# Patient Record
Sex: Female | Born: 1990 | Race: Black or African American | Hispanic: No | Marital: Single | Smoking: Current every day smoker
Health system: Southern US, Community
[De-identification: ages and names within clinical notes are randomized; demographics above are authoritative.]

## PROBLEM LIST (undated history)

## (undated) ENCOUNTER — Inpatient Hospital Stay (HOSPITAL_COMMUNITY): Payer: Self-pay

## (undated) DIAGNOSIS — F329 Major depressive disorder, single episode, unspecified: Secondary | ICD-10-CM

## (undated) DIAGNOSIS — A749 Chlamydial infection, unspecified: Secondary | ICD-10-CM

## (undated) DIAGNOSIS — N12 Tubulo-interstitial nephritis, not specified as acute or chronic: Secondary | ICD-10-CM

## (undated) DIAGNOSIS — L309 Dermatitis, unspecified: Secondary | ICD-10-CM

## (undated) DIAGNOSIS — A549 Gonococcal infection, unspecified: Secondary | ICD-10-CM

## (undated) DIAGNOSIS — D649 Anemia, unspecified: Secondary | ICD-10-CM

## (undated) DIAGNOSIS — F32A Depression, unspecified: Secondary | ICD-10-CM

## (undated) DIAGNOSIS — G459 Transient cerebral ischemic attack, unspecified: Secondary | ICD-10-CM

## (undated) HISTORY — PX: NO PAST SURGERIES: SHX2092

---
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
## 2008-02-08 ENCOUNTER — Emergency Department (HOSPITAL_COMMUNITY): Admission: EM | Admit: 2008-02-08 | Discharge: 2008-02-08 | Payer: Self-pay | Admitting: Emergency Medicine

## 2008-04-06 ENCOUNTER — Emergency Department (HOSPITAL_COMMUNITY): Admission: EM | Admit: 2008-04-06 | Discharge: 2008-04-07 | Payer: Self-pay | Admitting: Emergency Medicine

## 2008-04-07 ENCOUNTER — Emergency Department (HOSPITAL_COMMUNITY): Admission: EM | Admit: 2008-04-07 | Discharge: 2008-04-07 | Payer: Self-pay | Admitting: Emergency Medicine

## 2008-08-25 ENCOUNTER — Emergency Department (HOSPITAL_COMMUNITY): Admission: EM | Admit: 2008-08-25 | Discharge: 2008-08-25 | Payer: Self-pay | Admitting: Emergency Medicine

## 2009-03-01 ENCOUNTER — Inpatient Hospital Stay (HOSPITAL_COMMUNITY): Admission: AD | Admit: 2009-03-01 | Discharge: 2009-03-01 | Payer: Self-pay | Admitting: Obstetrics & Gynecology

## 2009-04-06 ENCOUNTER — Inpatient Hospital Stay (HOSPITAL_COMMUNITY): Admission: AD | Admit: 2009-04-06 | Discharge: 2009-04-06 | Payer: Self-pay | Admitting: Obstetrics & Gynecology

## 2009-04-07 ENCOUNTER — Inpatient Hospital Stay (HOSPITAL_COMMUNITY): Admission: AD | Admit: 2009-04-07 | Discharge: 2009-04-07 | Payer: Self-pay | Admitting: Obstetrics and Gynecology

## 2009-04-25 ENCOUNTER — Inpatient Hospital Stay (HOSPITAL_COMMUNITY): Admission: AD | Admit: 2009-04-25 | Discharge: 2009-04-25 | Payer: Self-pay | Admitting: Obstetrics & Gynecology

## 2009-04-25 ENCOUNTER — Ambulatory Visit: Payer: Self-pay | Admitting: Advanced Practice Midwife

## 2009-06-01 ENCOUNTER — Inpatient Hospital Stay (HOSPITAL_COMMUNITY): Admission: AD | Admit: 2009-06-01 | Discharge: 2009-06-01 | Payer: Self-pay | Admitting: Obstetrics & Gynecology

## 2009-06-25 ENCOUNTER — Ambulatory Visit (HOSPITAL_COMMUNITY): Admission: RE | Admit: 2009-06-25 | Discharge: 2009-06-25 | Payer: Self-pay | Admitting: Obstetrics

## 2009-09-28 ENCOUNTER — Inpatient Hospital Stay (HOSPITAL_COMMUNITY): Admission: AD | Admit: 2009-09-28 | Discharge: 2009-09-30 | Payer: Self-pay | Admitting: Obstetrics

## 2010-01-13 ENCOUNTER — Emergency Department (HOSPITAL_COMMUNITY): Admission: EM | Admit: 2010-01-13 | Discharge: 2010-01-14 | Payer: Self-pay | Admitting: Emergency Medicine

## 2010-03-28 ENCOUNTER — Inpatient Hospital Stay (HOSPITAL_COMMUNITY): Admission: AD | Admit: 2010-03-28 | Discharge: 2010-03-28 | Payer: Self-pay | Admitting: Obstetrics & Gynecology

## 2010-05-21 ENCOUNTER — Ambulatory Visit: Payer: Self-pay | Admitting: Nurse Practitioner

## 2010-05-21 ENCOUNTER — Inpatient Hospital Stay (HOSPITAL_COMMUNITY): Admission: AD | Admit: 2010-05-21 | Discharge: 2010-05-21 | Payer: Self-pay | Admitting: Obstetrics & Gynecology

## 2010-08-22 ENCOUNTER — Emergency Department (HOSPITAL_COMMUNITY): Admission: EM | Admit: 2010-08-22 | Discharge: 2010-08-22 | Payer: Self-pay | Admitting: Emergency Medicine

## 2011-01-18 LAB — POCT URINALYSIS DIPSTICK
Bilirubin Urine: NEGATIVE
Ketones, ur: NEGATIVE mg/dL
Protein, ur: NEGATIVE mg/dL
Specific Gravity, Urine: 1.02 (ref 1.005–1.030)

## 2011-01-18 LAB — GC/CHLAMYDIA PROBE AMP, GENITAL
Chlamydia, DNA Probe: POSITIVE — AB
GC Probe Amp, Genital: POSITIVE — AB

## 2011-01-18 LAB — POCT PREGNANCY, URINE: Preg Test, Ur: NEGATIVE

## 2011-01-18 LAB — WET PREP, GENITAL: Yeast Wet Prep HPF POC: NONE SEEN

## 2011-01-21 LAB — URINE MICROSCOPIC-ADD ON

## 2011-01-21 LAB — URINALYSIS, ROUTINE W REFLEX MICROSCOPIC
Bilirubin Urine: NEGATIVE
Glucose, UA: NEGATIVE mg/dL
Ketones, ur: 15 mg/dL — AB
Nitrite: NEGATIVE
Protein, ur: NEGATIVE mg/dL
Specific Gravity, Urine: >1.03 — ABNORMAL HIGH (ref 1.005–1.030)
Urobilinogen, UA: 1 mg/dL (ref 0.0–1.0)
pH: 6 (ref 5.0–8.0)

## 2011-01-21 LAB — POCT PREGNANCY, URINE: Preg Test, Ur: NEGATIVE

## 2011-01-21 LAB — WET PREP, GENITAL
Trich, Wet Prep: NONE SEEN
Yeast Wet Prep HPF POC: NONE SEEN

## 2011-01-21 LAB — GC/CHLAMYDIA PROBE AMP, GENITAL
Chlamydia, DNA Probe: POSITIVE — AB
GC Probe Amp, Genital: POSITIVE — AB

## 2011-01-23 LAB — DIFFERENTIAL
Basophils Absolute: 0 10*3/uL (ref 0.0–0.1)
Eosinophils Relative: 0 % (ref 0–5)
Lymphocytes Relative: 9 % — ABNORMAL LOW (ref 12–46)
Lymphs Abs: 1.3 10*3/uL (ref 0.7–4.0)
Monocytes Absolute: 1.7 10*3/uL — ABNORMAL HIGH (ref 0.1–1.0)
Monocytes Relative: 11 % (ref 3–12)
Neutro Abs: 12.8 10*3/uL — ABNORMAL HIGH (ref 1.7–7.7)

## 2011-01-23 LAB — COMPREHENSIVE METABOLIC PANEL
AST: 32 U/L (ref 0–37)
Albumin: 3.5 g/dL (ref 3.5–5.2)
BUN: 4 mg/dL — ABNORMAL LOW (ref 6–23)
Chloride: 98 mEq/L (ref 96–112)
Creatinine, Ser: 0.86 mg/dL (ref 0.4–1.2)
GFR calc Af Amer: >60 mL/min (ref >60)
Total Bilirubin: 1 mg/dL (ref 0.3–1.2)
Total Protein: 7.9 g/dL (ref 6.0–8.3)

## 2011-01-23 LAB — URINE MICROSCOPIC-ADD ON

## 2011-01-23 LAB — CBC
MCV: 84.9 fL (ref 78.0–100.0)
Platelets: 177 10*3/uL (ref 150–400)
RDW: 14.6 % (ref 11.5–15.5)
WBC: 15.9 10*3/uL — ABNORMAL HIGH (ref 4.0–10.5)

## 2011-01-23 LAB — WET PREP, GENITAL
Trich, Wet Prep: NONE SEEN
Yeast Wet Prep HPF POC: NONE SEEN

## 2011-01-23 LAB — URINE CULTURE

## 2011-01-23 LAB — URINALYSIS, ROUTINE W REFLEX MICROSCOPIC
Bilirubin Urine: NEGATIVE
Glucose, UA: NEGATIVE mg/dL
Ketones, ur: 40 mg/dL — AB
Specific Gravity, Urine: >1.03 — ABNORMAL HIGH (ref 1.005–1.030)
pH: 6 (ref 5.0–8.0)

## 2011-01-23 LAB — POCT PREGNANCY, URINE: Preg Test, Ur: NEGATIVE

## 2011-01-30 LAB — URINALYSIS, ROUTINE W REFLEX MICROSCOPIC
Bilirubin Urine: NEGATIVE
Glucose, UA: NEGATIVE mg/dL
Specific Gravity, Urine: 1.022 (ref 1.005–1.030)
Urobilinogen, UA: 1 mg/dL (ref 0.0–1.0)
pH: 7 (ref 5.0–8.0)

## 2011-01-30 LAB — URINE CULTURE: Colony Count: >=100000

## 2011-01-30 LAB — URINE MICROSCOPIC-ADD ON

## 2011-01-30 LAB — POCT PREGNANCY, URINE: Preg Test, Ur: NEGATIVE

## 2011-02-08 LAB — TYPE AND SCREEN: Antibody Screen: NEGATIVE

## 2011-02-08 LAB — CBC
HCT: 29.8 % — ABNORMAL LOW (ref 36.0–46.0)
Hemoglobin: 10.6 g/dL — ABNORMAL LOW (ref 12.0–15.0)
MCHC: 33.4 g/dL (ref 30.0–36.0)
MCV: 87.4 fL (ref 78.0–100.0)
Platelets: 268 10*3/uL (ref 150–400)
Platelets: 295 10*3/uL (ref 150–400)
RDW: 13.3 % (ref 11.5–15.5)
RDW: 13.5 % (ref 11.5–15.5)

## 2011-02-08 LAB — DIFFERENTIAL
Basophils Absolute: 0 10*3/uL (ref 0.0–0.1)
Basophils Relative: 0 % (ref 0–1)
Monocytes Absolute: 0.7 10*3/uL (ref 0.1–1.0)
Neutro Abs: 9.8 10*3/uL — ABNORMAL HIGH (ref 1.7–7.7)
Neutrophils Relative %: 76 % (ref 43–77)

## 2011-02-08 LAB — GLUCOSE, CAPILLARY

## 2011-02-08 LAB — STREP B DNA PROBE

## 2011-02-08 LAB — RPR: RPR Ser Ql: NONREACTIVE

## 2011-02-08 LAB — HEPATITIS B SURFACE ANTIGEN: Hepatitis B Surface Ag: NEGATIVE

## 2011-02-12 LAB — URINALYSIS, ROUTINE W REFLEX MICROSCOPIC
Nitrite: POSITIVE — AB
Specific Gravity, Urine: 1.02 (ref 1.005–1.030)
pH: 6 (ref 5.0–8.0)

## 2011-02-12 LAB — URINE MICROSCOPIC-ADD ON

## 2011-02-12 LAB — GC/CHLAMYDIA PROBE AMP, GENITAL: Chlamydia, DNA Probe: NEGATIVE

## 2011-02-13 LAB — URINALYSIS, ROUTINE W REFLEX MICROSCOPIC
Glucose, UA: NEGATIVE mg/dL
Protein, ur: NEGATIVE mg/dL
pH: 6.5 (ref 5.0–8.0)

## 2011-02-15 LAB — ABO/RH: ABO/RH(D): B POS

## 2011-02-15 LAB — HCG, QUANTITATIVE, PREGNANCY: hCG, Beta Chain, Quant, S: 70557 m[IU]/mL — ABNORMAL HIGH (ref <5)

## 2011-02-15 LAB — URINALYSIS, ROUTINE W REFLEX MICROSCOPIC
Ketones, ur: 15 mg/dL — AB
Nitrite: POSITIVE — AB
Specific Gravity, Urine: 1.02 (ref 1.005–1.030)
pH: 7.5 (ref 5.0–8.0)

## 2011-02-15 LAB — URINE MICROSCOPIC-ADD ON

## 2011-02-15 LAB — CBC
MCV: 87.3 fL (ref 78.0–100.0)
RBC: 4.33 MIL/uL (ref 3.87–5.11)
WBC: 10.4 10*3/uL (ref 4.0–10.5)

## 2011-02-15 LAB — WET PREP, GENITAL: Trich, Wet Prep: NONE SEEN

## 2011-02-15 LAB — URINE CULTURE

## 2011-03-13 ENCOUNTER — Inpatient Hospital Stay (HOSPITAL_COMMUNITY)
Admission: AD | Admit: 2011-03-13 | Discharge: 2011-03-13 | Disposition: A | Discharge status: Home / Self Care | Payer: Self-pay | Source: Ambulatory Visit | Attending: Obstetrics & Gynecology | Admitting: Obstetrics & Gynecology

## 2011-03-13 ENCOUNTER — Inpatient Hospital Stay (HOSPITAL_COMMUNITY): Payer: Self-pay

## 2011-03-13 DIAGNOSIS — R109 Unspecified abdominal pain: Secondary | ICD-10-CM | POA: Insufficient documentation

## 2011-03-13 DIAGNOSIS — N760 Acute vaginitis: Secondary | ICD-10-CM

## 2011-03-13 DIAGNOSIS — N12 Tubulo-interstitial nephritis, not specified as acute or chronic: Secondary | ICD-10-CM | POA: Insufficient documentation

## 2011-03-13 LAB — CBC
Hemoglobin: 12.2 g/dL (ref 12.0–15.0)
MCH: 28.4 pg (ref 26.0–34.0)
MCV: 86.3 fL (ref 78.0–100.0)
RBC: 4.3 MIL/uL (ref 3.87–5.11)

## 2011-03-13 LAB — URINALYSIS, ROUTINE W REFLEX MICROSCOPIC
Nitrite: POSITIVE — AB
Specific Gravity, Urine: 1.02 (ref 1.005–1.030)
pH: 6.5 (ref 5.0–8.0)

## 2011-03-13 LAB — URINE MICROSCOPIC-ADD ON

## 2011-03-13 LAB — POCT PREGNANCY, URINE: Preg Test, Ur: NEGATIVE

## 2011-03-15 LAB — URINE CULTURE
Colony Count: >=100000
Culture  Setup Time: 201205080131

## 2011-03-15 LAB — GC/CHLAMYDIA PROBE AMP, GENITAL: GC Probe Amp, Genital: POSITIVE — AB

## 2011-07-16 ENCOUNTER — Encounter (HOSPITAL_COMMUNITY): Payer: Self-pay | Admitting: *Deleted

## 2011-07-16 ENCOUNTER — Inpatient Hospital Stay (HOSPITAL_COMMUNITY)
Admission: AD | Admit: 2011-07-16 | Discharge: 2011-07-16 | Disposition: A | Discharge status: Home / Self Care | Payer: Self-pay | Source: Ambulatory Visit | Attending: Obstetrics & Gynecology | Admitting: Obstetrics & Gynecology

## 2011-07-16 DIAGNOSIS — Z3490 Encounter for supervision of normal pregnancy, unspecified, unspecified trimester: Secondary | ICD-10-CM

## 2011-07-16 DIAGNOSIS — R109 Unspecified abdominal pain: Secondary | ICD-10-CM | POA: Insufficient documentation

## 2011-07-16 DIAGNOSIS — B9689 Other specified bacterial agents as the cause of diseases classified elsewhere: Secondary | ICD-10-CM | POA: Insufficient documentation

## 2011-07-16 DIAGNOSIS — A499 Bacterial infection, unspecified: Secondary | ICD-10-CM | POA: Insufficient documentation

## 2011-07-16 DIAGNOSIS — Z1389 Encounter for screening for other disorder: Secondary | ICD-10-CM

## 2011-07-16 DIAGNOSIS — N760 Acute vaginitis: Secondary | ICD-10-CM | POA: Insufficient documentation

## 2011-07-16 DIAGNOSIS — O2390 Unspecified genitourinary tract infection in pregnancy, unspecified trimester: Secondary | ICD-10-CM | POA: Insufficient documentation

## 2011-07-16 HISTORY — DX: Gonococcal infection, unspecified: A54.9

## 2011-07-16 HISTORY — DX: Chlamydial infection, unspecified: A74.9

## 2011-07-16 HISTORY — DX: Depression, unspecified: F32.A

## 2011-07-16 HISTORY — DX: Major depressive disorder, single episode, unspecified: F32.9

## 2011-07-16 LAB — URINE MICROSCOPIC-ADD ON

## 2011-07-16 LAB — URINALYSIS, ROUTINE W REFLEX MICROSCOPIC
Bilirubin Urine: NEGATIVE
Glucose, UA: NEGATIVE mg/dL
Hgb urine dipstick: NEGATIVE
Ketones, ur: 15 mg/dL — AB
Nitrite: NEGATIVE
Protein, ur: NEGATIVE mg/dL
Specific Gravity, Urine: 1.025 (ref 1.005–1.030)
Urobilinogen, UA: 0.2 mg/dL (ref 0.0–1.0)
pH: 6 (ref 5.0–8.0)

## 2011-07-16 LAB — WET PREP, GENITAL
Trich, Wet Prep: NONE SEEN
Yeast Wet Prep HPF POC: NONE SEEN

## 2011-07-16 LAB — POCT PREGNANCY, URINE: Preg Test, Ur: POSITIVE

## 2011-07-16 MED ORDER — METRONIDAZOLE 500 MG PO TABS: 500.0000 mg | ORAL_TABLET | Freq: Two times a day (BID) | ORAL | Status: AC

## 2011-07-16 NOTE — Progress Notes (Signed)
Pt c/osharp constant  lower abdominal x3 days. No vaginal bleeding or discharge.

## 2011-07-16 NOTE — ED Provider Notes (Signed)
Chief Complaint:  Abdominal Pain   Theresa [name] is  20 y.o. G2P1001.  Patient's last menstrual period was 05/31/2011.Marland Kitchen [redacted]w[redacted]d Her pregnancy status is positive.  She presents complaining of Abdominal Pain . Onset is described as insidious and has been present for  3 days. Described as lower abd cramping, mild. Denies vag discharge, bleeding, nausea, vomiting dysuria, fever, or chills   Obstetrical/Gynecological History: OB History    Grav Para Term Preterm Abortions TAB SAB Ect Mult Living   2 1 1       1       Past Medical History: Past Medical History  Diagnosis Date  . Chlamydia   . Gonorrhea   . Depression     Past Surgical History: Past Surgical History  Procedure Date  . No past surgeries     Family History: No family history on file.  Social History: History  Substance Use Topics  . Smoking status: Never Smoker   . Smokeless tobacco: Not on file  . Alcohol Use: No    Allergies: No Known Allergies  No prescriptions prior to admission    Review of Systems - Negative except what has been reviewed in the HPI  Physical Exam   Blood pressure 108/62, pulse 75, temperature 98.1 F (36.7 C), temperature source Oral, resp. rate 20, height 5' (1.524 m), weight 55.339 kg (122 lb), last menstrual period 05/31/2011.  General: General appearance - alert, well appearing, and in no distress and normal appearing weight Mental status - alert, oriented to person, place, and time, normal mood, behavior, speech, dress, motor activity, and thought processes Abdomen - soft, nontender, nondistended, no masses or organomegaly no CVA tenderness Focused Gynecological Exam: VULVA: normal appearing vulva with no masses, tenderness or lesions, VAGINA: vaginal discharge - white, milky and thin, CERVIX: normal appearing cervix without discharge or lesions, UTERUS: uterus is normal size, shape, consistency and nontender, surgically absent, vaginal cuff well healed, enlarged to 8-9 week's  size, ADNEXA: normal adnexa in size, nontender and no masses  Labs: Recent Results (from the past 24 hour(s))  URINALYSIS, ROUTINE W REFLEX MICROSCOPIC   Collection Time   07/16/11  9:35 PM      Component Value Range   Color, Urine YELLOW  YELLOW    Appearance CLEAR  CLEAR    Specific Gravity, Urine 1.025  1.005 - 1.030    pH 6.0  5.0 - 8.0    Glucose, UA NEGATIVE  NEGATIVE (mg/dL)   Hgb urine dipstick NEGATIVE  NEGATIVE    Bilirubin Urine NEGATIVE  NEGATIVE    Ketones, ur 15 (*) NEGATIVE (mg/dL)   Protein, ur NEGATIVE  NEGATIVE (mg/dL)   Urobilinogen, UA 0.2  0.0 - 1.0 (mg/dL)   Nitrite NEGATIVE  NEGATIVE    Leukocytes, UA TRACE (*) NEGATIVE   URINE MICROSCOPIC-ADD ON   Collection Time   07/16/11  9:35 PM      Component Value Range   Squamous Epithelial / LPF RARE  RARE    WBC, UA 3-6  <3 (WBC/hpf)   RBC / HPF 0-2  <3 (RBC/hpf)   Bacteria, UA MANY (*) RARE   POCT PREGNANCY, URINE   Collection Time   07/16/11  9:41 PM      Component Value Range   Preg Test, Ur POSITIVE    WET PREP, GENITAL   Collection Time   07/16/11 10:37 PM      Component Value Range   Yeast, Wet Prep NONE SEEN  NONE SEEN  Trich, Wet Prep NONE SEEN  NONE SEEN    Clue Cells, Wet Prep MODERATE (*) NONE SEEN    WBC, Wet Prep HPF POC MODERATE (*) NONE SEEN    Imaging Studies:  Informal bedside US: IUP, + yolk sac, CRL [redacted]w[redacted]d, + cardiac activity   Assessment: Viable IUP  Abd Pain BV  Plan: Discharge home Rx Flagyl FU with the OB/Gyn provider of choice   Joseeduardo Brix E. 07/16/2011,11:21 PM

## 2011-07-17 NOTE — ED Provider Notes (Signed)
Agree with above note.  Theresa [name] H. 07/17/2011 5:58 AM  

## 2011-08-01 LAB — URINE MICROSCOPIC-ADD ON

## 2011-08-01 LAB — URINALYSIS, ROUTINE W REFLEX MICROSCOPIC
Hgb urine dipstick: NEGATIVE
Nitrite: POSITIVE — AB
Protein, ur: NEGATIVE
Specific Gravity, Urine: 1.028
Urobilinogen, UA: 2 — ABNORMAL HIGH

## 2011-08-01 LAB — POCT PREGNANCY, URINE
Operator id: 29452
Preg Test, Ur: NEGATIVE

## 2011-08-01 LAB — RPR: RPR Ser Ql: NONREACTIVE

## 2011-08-07 LAB — URINALYSIS, ROUTINE W REFLEX MICROSCOPIC
Bilirubin Urine: NEGATIVE
Hgb urine dipstick: NEGATIVE
Specific Gravity, Urine: 1.027
Urobilinogen, UA: 1
pH: 7

## 2011-08-07 LAB — URINE CULTURE: Colony Count: >=100000

## 2011-08-07 LAB — URINE MICROSCOPIC-ADD ON

## 2011-08-22 LAB — POCT RAPID STREP A: Streptococcus, Group A Screen (Direct): NEGATIVE

## 2011-08-22 LAB — DIFFERENTIAL
Basophils Relative: 0
Lymphocytes Relative: 31
Lymphs Abs: 2
Monocytes Absolute: 0.7
Monocytes Relative: 10
Neutro Abs: 3.7
Neutrophils Relative %: 58

## 2011-08-22 LAB — CBC
Hemoglobin: 13.2
RBC: 4.67
WBC: 6.4

## 2011-08-22 LAB — POCT INFECTIOUS MONO SCREEN: Mono Screen: NEGATIVE

## 2011-09-12 ENCOUNTER — Encounter (HOSPITAL_COMMUNITY): Payer: Self-pay | Admitting: *Deleted

## 2011-09-12 ENCOUNTER — Inpatient Hospital Stay (HOSPITAL_COMMUNITY)
Admission: AD | Admit: 2011-09-12 | Discharge: 2011-09-13 | Disposition: A | Discharge status: Home / Self Care | Payer: Self-pay | Source: Ambulatory Visit | Attending: Obstetrics & Gynecology | Admitting: Obstetrics & Gynecology

## 2011-09-12 DIAGNOSIS — O99891 Other specified diseases and conditions complicating pregnancy: Secondary | ICD-10-CM | POA: Insufficient documentation

## 2011-09-12 DIAGNOSIS — N949 Unspecified condition associated with female genital organs and menstrual cycle: Secondary | ICD-10-CM

## 2011-09-12 DIAGNOSIS — R109 Unspecified abdominal pain: Secondary | ICD-10-CM | POA: Insufficient documentation

## 2011-09-12 LAB — URINALYSIS, ROUTINE W REFLEX MICROSCOPIC
Bilirubin Urine: NEGATIVE
Glucose, UA: NEGATIVE mg/dL
Hgb urine dipstick: NEGATIVE
Specific Gravity, Urine: >1.03 — ABNORMAL HIGH (ref 1.005–1.030)
Urobilinogen, UA: 0.2 mg/dL (ref 0.0–1.0)
pH: 6 (ref 5.0–8.0)

## 2011-09-12 NOTE — Progress Notes (Signed)
Pt states she is pregnant and is having a headache for the past 2wks. Pt states she is also having cyst.

## 2011-09-12 NOTE — Progress Notes (Signed)
Pt states, " I've had low abdominal pain for three days. It comes on and last for three hours or so and then goes away.I think my pee had a little odor. I 'm having a headache everyday for two week, and it won't go away."

## 2011-09-13 LAB — WET PREP, GENITAL
Clue Cells Wet Prep HPF POC: NONE SEEN
Yeast Wet Prep HPF POC: NONE SEEN

## 2011-09-13 MED ORDER — ACETAMINOPHEN 325 MG PO TABS
650.0000 mg | ORAL_TABLET | Freq: Once | ORAL | Status: AC
  Administered 2011-09-13: 650 mg via ORAL
  Filled 2011-09-13: qty 2

## 2011-09-13 NOTE — ED Provider Notes (Signed)
History     CSN: [id number] Arrival date & time: 09/12/2011 10:23 PM   None     Chief Complaint  Patient presents with  . Abdominal Pain  . Headache    (Consider location/radiation/quality/duration/timing/severity/associated sxs/prior treatment) HPI Theresa [name] is a 20 y.o. female @ [redacted]w[redacted]d gestation presents to MAU for lower abdominal pain that started 2 days ago. Describes the pain as sharp that comes and goes. Denies vaginal bleeding or  UTI symptoms.  Complains of headache that has been persistent for the past 2 weeks. Took motrin but did not work. Has not started prenatal care.   Past Medical History  Diagnosis Date  . Chlamydia   . Gonorrhea   . Depression     Past Surgical History  Procedure Date  . No past surgeries     History reviewed. No pertinent family history.  History  Substance Use Topics  . Smoking status: Current Some Day Smoker -- 0.0 packs/day  . Smokeless tobacco: Not on file  . Alcohol Use: No    OB History    Grav Para Term Preterm Abortions TAB SAB Ect Mult Living   2 1 1       1       Review of Systems  Constitutional: Positive for appetite change. Negative for fever, chills, diaphoresis and fatigue.  HENT: Negative for ear pain, congestion, sore throat, facial swelling, neck pain, neck stiffness, dental problem and sinus pressure.   Eyes: Negative for photophobia, pain and discharge.  Respiratory: Negative for cough, chest tightness and wheezing.   Gastrointestinal: Negative for nausea, vomiting, abdominal pain, diarrhea, constipation and abdominal distention.  Genitourinary: Positive for vaginal discharge. Negative for dysuria, frequency, flank pain, vaginal bleeding and difficulty urinating.  Musculoskeletal: Positive for back pain. Negative for myalgias and gait problem.  Skin: Negative for color change and rash.  Neurological: Positive for headaches. Negative for dizziness, speech difficulty, weakness, light-headedness and numbness.    Psychiatric/Behavioral: Negative for confusion and agitation.    Allergies  Review of patient's allergies indicates no known allergies.  Home Medications  No current outpatient prescriptions on file.  BP 111/51  Pulse 88  Temp(Src) 98.7 F (37.1 C) (Oral)  Resp 16  Ht 5' 0.5" (1.537 m)  Wt 121 lb 8 oz (55.112 kg)  BMI 23.34 kg/m2  SpO2 99%  LMP 05/31/2011  Physical Exam  Nursing note and vitals reviewed. Constitutional: She is oriented to person, place, and time. She appears well-developed and well-nourished. No distress.  HENT:  Head: Normocephalic.  Eyes: EOM are normal.  Neck: Neck supple.  Cardiovascular: Normal rate.   Pulmonary/Chest: Effort normal.  Abdominal: Soft.       Minimal tenderness bilateral abdomen without rebound or guarding. Consistent with round ligament pain. Positive doppler fetal heart tones at 152.  Genitourinary:       External genitalia without lesions. White vaginal discharge. Cervix closed, long, uterus consistent with gestational age.  Musculoskeletal: Normal range of motion.  Neurological: She is alert and oriented to person, place, and time. No cranial nerve deficit.  Skin: Skin is warm and dry.  Psychiatric: She has a normal mood and affect. Her behavior is normal. Judgment and thought content normal.   After tylenol patient's headache has gone and she has no abdominal pain. Informal bedside ultrasound shows active IUP.  Assessment:  Round ligament pain  Plan:  Tylenol as needed for pain   Stop ibuprofen   Follow up with Dr. Gaynell Face for prenatal care as  planned.  ED Course  Procedures   MDM          Kerrie Buffalo, NP 09/13/11 [id number]

## 2011-09-14 LAB — GC/CHLAMYDIA PROBE AMP, GENITAL
Chlamydia, DNA Probe: NEGATIVE
GC Probe Amp, Genital: NEGATIVE

## 2011-10-05 ENCOUNTER — Inpatient Hospital Stay (HOSPITAL_COMMUNITY)
Admission: AD | Admit: 2011-10-05 | Discharge: 2011-10-05 | Disposition: A | Discharge status: Home / Self Care | Source: Ambulatory Visit | Attending: Obstetrics & Gynecology | Admitting: Obstetrics & Gynecology

## 2011-10-05 ENCOUNTER — Encounter (HOSPITAL_COMMUNITY): Payer: Self-pay | Admitting: *Deleted

## 2011-10-05 ENCOUNTER — Inpatient Hospital Stay (HOSPITAL_COMMUNITY)

## 2011-10-05 DIAGNOSIS — O0930 Supervision of pregnancy with insufficient antenatal care, unspecified trimester: Secondary | ICD-10-CM | POA: Insufficient documentation

## 2011-10-05 DIAGNOSIS — Z3490 Encounter for supervision of normal pregnancy, unspecified, unspecified trimester: Secondary | ICD-10-CM

## 2011-10-05 DIAGNOSIS — Z3480 Encounter for supervision of other normal pregnancy, unspecified trimester: Secondary | ICD-10-CM

## 2011-10-05 DIAGNOSIS — O368190 Decreased fetal movements, unspecified trimester, not applicable or unspecified: Secondary | ICD-10-CM | POA: Insufficient documentation

## 2011-10-05 NOTE — Progress Notes (Signed)
Patient states she has been feeling the baby move but has not felt it in about 5 days. Denies any pain or bleeding at this time. Patient in incarcerated.

## 2011-10-05 NOTE — Progress Notes (Signed)
Pt in c/o decreased fetal movement.  States she has been feeling movement for a few weeks, and hadn't felt movement in 5 days.  Denies any bleeding or discharge.  Denies any pain.

## 2011-10-05 NOTE — ED Provider Notes (Signed)
History     Chief Complaint  Patient presents with  . Decreased Fetal Movement   HPI  Pt here with report of concern due to decreased fetal movement x 5 days.  Denies vaginal bleeding, abnormal discharge, or leaking of fluid.  Currently in jail with expected release 10/13/11.    Past Medical History  Diagnosis Date  . Chlamydia   . Gonorrhea   . Depression     Past Surgical History  Procedure Date  . No past surgeries     Family History  Problem Relation Age of Onset  . Birth defects Neg Hx   . Diabetes Neg Hx   . Stroke Neg Hx   . Hypertension Neg Hx     History  Substance Use Topics  . Smoking status: Current Some Day Smoker -- 0.0 packs/day  . Smokeless tobacco: Not on file  . Alcohol Use: No    Allergies: No Known Allergies  Prescriptions prior to admission  Medication Sig Dispense Refill  . prenatal vitamin w/FE, FA (PRENATAL 1 + 1) 27-1 MG TABS Take 1 tablet by mouth daily.          Review of Systems  Constitutional:       Decreased fetal movement  All other systems reviewed and are negative.   Physical Exam   Blood pressure 110/56, pulse 75, temperature 98.7 F (37.1 C), temperature source Oral, resp. rate 16, last menstrual period 05/31/2011, SpO2 99.00%.  Physical Exam  Constitutional: She is oriented to person, place, and time. She appears well-developed and well-nourished. No distress.  HENT:  Head: Normocephalic.  Neck: Normal range of motion. Neck supple.  Cardiovascular: Normal rate, regular rhythm and normal heart sounds.   Respiratory: Effort normal and breath sounds normal.  GI: Soft. There is no tenderness.  Genitourinary: No bleeding around the vagina.  Neurological: She is alert and oriented to person, place, and time.  Skin: Skin is warm and dry.    MAU Course  Procedures  Dating Ultrasound:   EGA 20.1 weeks; cervical length 3.31 cm     Assessment and Plan  Pregnancy - No prenatal Care  Plan: DC to jail Provided  reassurance F/U with provider once released on 10/13/11 [facility]   [facility] 10/05/2011, 5:20 PM

## 2011-11-03 ENCOUNTER — Encounter (HOSPITAL_COMMUNITY): Payer: Self-pay | Admitting: *Deleted

## 2011-11-03 ENCOUNTER — Inpatient Hospital Stay (HOSPITAL_COMMUNITY)
Admission: AD | Admit: 2011-11-03 | Discharge: 2011-11-03 | Disposition: A | Discharge status: Home / Self Care | Source: Ambulatory Visit | Attending: Obstetrics & Gynecology | Admitting: Obstetrics & Gynecology

## 2011-11-03 DIAGNOSIS — O4690 Antepartum hemorrhage, unspecified, unspecified trimester: Secondary | ICD-10-CM | POA: Insufficient documentation

## 2011-11-03 DIAGNOSIS — N898 Other specified noninflammatory disorders of vagina: Secondary | ICD-10-CM

## 2011-11-03 DIAGNOSIS — O0930 Supervision of pregnancy with insufficient antenatal care, unspecified trimester: Secondary | ICD-10-CM

## 2011-11-03 LAB — DIFFERENTIAL
Basophils Relative: 0 % (ref 0–1)
Eosinophils Relative: 2 % (ref 0–5)
Lymphocytes Relative: 27 % (ref 12–46)
Monocytes Absolute: 0.5 10*3/uL (ref 0.1–1.0)
Monocytes Relative: 5 % (ref 3–12)
Neutro Abs: 7.3 10*3/uL (ref 1.7–7.7)

## 2011-11-03 LAB — TYPE AND SCREEN

## 2011-11-03 LAB — HIV ANTIBODY (ROUTINE TESTING W REFLEX): HIV: NONREACTIVE

## 2011-11-03 LAB — CBC
HCT: 30.8 % — ABNORMAL LOW (ref 36.0–46.0)
Hemoglobin: 10.6 g/dL — ABNORMAL LOW (ref 12.0–15.0)
MCHC: 34.4 g/dL (ref 30.0–36.0)
MCV: 86.8 fL (ref 78.0–100.0)

## 2011-11-03 NOTE — Progress Notes (Signed)
Dr. Stinson in to see pt. 

## 2011-11-03 NOTE — Progress Notes (Signed)
Written and verbal d/c instructions given and understanding voiced. Plans to see Dr Gaynell Face and waiting for [facility] card

## 2011-11-03 NOTE — ED Notes (Signed)
Dr Adrian Blackwater notified of pt's admission and status. Will see pt

## 2011-11-03 NOTE — ED Provider Notes (Signed)
History     Chief Complaint  Patient presents with  . Vaginal Bleeding   HPI This is a 20 year old female G2 P1 001 at 24 weeks and 2 days who presents the MAU with a reddish brown discharge and concerns for vaginal bleeding. Patient denies vaginal discharge, itching, contractions, and decreased fetal activity. She noticed the discharge starting this morning, which has been gradually gone away. She reports no other difficulties with this pregnancy. She has not sought prenatal care yet  OB History    Grav Para Term Preterm Abortions TAB SAB Ect Mult Living   2 1 1       1       Past Medical History  Diagnosis Date  . Chlamydia   . Gonorrhea   . Depression     Past Surgical History  Procedure Date  . No past surgeries     Family History  Problem Relation Age of Onset  . Birth defects Neg Hx   . Diabetes Neg Hx   . Stroke Neg Hx   . Hypertension Neg Hx   . Anesthesia problems Neg Hx   . Hypotension Neg Hx   . Malignant hyperthermia Neg Hx   . Pseudochol deficiency Neg Hx     History  Substance Use Topics  . Smoking status: Former Smoker -- 0.0 packs/day  . Smokeless tobacco: Not on file  . Alcohol Use: No    Allergies: No Known Allergies  Prescriptions prior to admission  Medication Sig Dispense Refill  . acetaminophen (TYLENOL) 325 MG tablet Take 650 mg by mouth daily as needed. For pain       . prenatal vitamin w/FE, FA (PRENATAL 1 + 1) 27-1 MG TABS Take 1 tablet by mouth daily.          Review of Systems  All other systems reviewed and are negative.   Physical Exam   Blood pressure 101/76, pulse 87, temperature 99.2 F (37.3 C), temperature source Oral, resp. rate 20, height 5' 1.5" (1.562 m), weight 61.054 kg (134 lb 9.6 oz), last menstrual period 05/31/2011, SpO2 99.00%.  Physical Exam  Constitutional: She appears well-developed and well-nourished.  HENT:  Head: Normocephalic.  Eyes: Pupils are equal, round, and reactive to light.  Respiratory:  Effort normal.  GI: Soft. Bowel sounds are normal. She exhibits no distension and no mass. There is no tenderness. There is no rebound and no guarding.   Dilation: Closed Effacement (%): Thick Exam by:: Dr Adrian Blackwater No blood or discharge on glove  Fetal heart tones: Approximately 150 MAU Course  Procedures   Assessment and Plan  #1 G2 P1 001 at 24 weeks 2 days. #2 insufficient prenatal care  Patient was observed in the MAU for approximately one hour without contractions noted on tocometry. With no evidence of brownish discharge on exam, we'll discharge patient to home to followup with prenatal care provider, which the patient has indicated will be Dr. Gaynell Face. I did obtain prenatal labs. Preterm labor precautions given.  Theresa [name] 11/03/2011, 7:44 PM

## 2011-11-03 NOTE — Progress Notes (Signed)
Patient states she has had some brown spotting today. Denies any pain. Patient plans to see Dr. Gaynell Face but has not had an appointment yet. Reports fetal movement. Marland Kitchen

## 2011-11-03 NOTE — Progress Notes (Signed)
OK to d/c EFM per Dr Adrian Blackwater.

## 2011-11-03 NOTE — ED Notes (Signed)
Ginger ale to pt 

## 2011-11-04 LAB — RPR: RPR Ser Ql: NONREACTIVE

## 2011-11-07 NOTE — L&D Delivery Note (Signed)
Delivery Note At 10:16 PM a viable female was delivered via Vaginal, Spontaneous Delivery (Presentation: Right Occiput Anterior).  APGAR: 7, 9; weight .   Placenta status: Intact, Spontaneous.  Cord: 3 vessels with the following complications: None.  Cord pH: NA  Anesthesia: Epidural  Episiotomy: None Lacerations: None Est. Blood Loss (mL): 200  Mom to postpartum.  Baby to nursery-stable.  Tera Pellicane 02/20/2012, 10:41 PM

## 2011-11-07 NOTE — L&D Delivery Note (Signed)
Agree with above note.  Theresa [name] 03/11/2012 2:11 PM   

## 2012-01-15 ENCOUNTER — Encounter (HOSPITAL_COMMUNITY): Payer: Self-pay | Admitting: *Deleted

## 2012-01-15 ENCOUNTER — Inpatient Hospital Stay (HOSPITAL_COMMUNITY)
Admission: AD | Admit: 2012-01-15 | Discharge: 2012-01-15 | Disposition: A | Discharge status: Home Health | Payer: Self-pay | Attending: Obstetrics and Gynecology | Admitting: Obstetrics and Gynecology

## 2012-01-15 DIAGNOSIS — Z3480 Encounter for supervision of other normal pregnancy, unspecified trimester: Secondary | ICD-10-CM

## 2012-01-15 DIAGNOSIS — O0930 Supervision of pregnancy with insufficient antenatal care, unspecified trimester: Secondary | ICD-10-CM

## 2012-01-15 DIAGNOSIS — B9689 Other specified bacterial agents as the cause of diseases classified elsewhere: Secondary | ICD-10-CM | POA: Insufficient documentation

## 2012-01-15 DIAGNOSIS — O2390 Unspecified genitourinary tract infection in pregnancy, unspecified trimester: Secondary | ICD-10-CM | POA: Insufficient documentation

## 2012-01-15 DIAGNOSIS — O99891 Other specified diseases and conditions complicating pregnancy: Secondary | ICD-10-CM | POA: Insufficient documentation

## 2012-01-15 DIAGNOSIS — A499 Bacterial infection, unspecified: Secondary | ICD-10-CM

## 2012-01-15 DIAGNOSIS — N760 Acute vaginitis: Secondary | ICD-10-CM | POA: Insufficient documentation

## 2012-01-15 LAB — WET PREP, GENITAL
Trich, Wet Prep: NONE SEEN
Yeast Wet Prep HPF POC: NONE SEEN

## 2012-01-15 MED ORDER — METRONIDAZOLE 500 MG PO TABS: 500.0000 mg | ORAL_TABLET | Freq: Two times a day (BID) | ORAL | Status: AC

## 2012-01-15 NOTE — Progress Notes (Signed)
Pt G2 P1 at 34.5wks, had a gush of clear fluid at 0045.

## 2012-01-15 NOTE — ED Provider Notes (Signed)
History     Chief Complaint  Patient presents with  . Rupture of Membranes   HPI  Pt G2 P1 at 34.5wks, had a gush of clear fluid at 0045.  Denies vaginal bleeding.  Feels irregular "pains".  +fetal movement.     Past Medical History  Diagnosis Date  . Chlamydia   . Gonorrhea   . Depression     Past Surgical History  Procedure Date  . No past surgeries     Family History  Problem Relation Age of Onset  . Birth defects Neg Hx   . Diabetes Neg Hx   . Stroke Neg Hx   . Hypertension Neg Hx   . Anesthesia problems Neg Hx   . Hypotension Neg Hx   . Malignant hyperthermia Neg Hx   . Pseudochol deficiency Neg Hx     History  Substance Use Topics  . Smoking status: Former Smoker -- 0.0 packs/day    Types: Cigarettes  . Smokeless tobacco: Not on file  . Alcohol Use: No    Allergies: No Known Allergies  Prescriptions prior to admission  Medication Sig Dispense Refill  . acetaminophen (TYLENOL) 325 MG tablet Take 650 mg by mouth daily as needed. For pain         Review of Systems  Genitourinary:       Vaginal dischage  All other systems reviewed and are negative.   Physical Exam   Blood pressure 97/58, pulse 116, temperature 98.5 F (36.9 C), temperature source Oral, resp. rate 18, height 5' (1.524 m), weight 66.769 kg (147 lb 3.2 oz), last menstrual period 05/31/2011.  Physical Exam  Constitutional: She is oriented to person, place, and time. She appears well-developed and well-nourished. No distress.  HENT:  Head: Normocephalic.  Neck: Normal range of motion. Neck supple.  Cardiovascular: Normal rate, regular rhythm and normal heart sounds.  Exam reveals no gallop and no friction rub.   No murmur heard. Respiratory: Effort normal and breath sounds normal. No respiratory distress.  GI: Soft. There is no tenderness. There is no CVA tenderness.  Genitourinary: Uterus is enlarged. Cervix exhibits no motion tenderness and no discharge. Vaginal discharge (white,  creamy) found.       Cervix - closed  Musculoskeletal: Normal range of motion.  Neurological: She is alert and oriented to person, place, and time.  Skin: Skin is warm and dry.  Psychiatric: She has a normal mood and affect.   FHR 130's, +accel, reactive Toco - irritability (not felt by pt) MAU Course  Procedures  Results for orders placed during the hospital encounter of 01/15/12 (from the past 24 hour(s))  WET PREP, GENITAL     Status: Abnormal   Collection Time   01/15/12  2:10 AM      Component Value Range   Yeast Wet Prep HPF POC NONE SEEN  NONE SEEN    Trich, Wet Prep NONE SEEN  NONE SEEN    Clue Cells Wet Prep HPF POC FEW (*) NONE SEEN    WBC, Wet Prep HPF POC FEW (*) NONE SEEN      Assessment and Plan  Bacterial Vaginosis  Plan:  DC to home RX Flagyl Preterm Labor Precautions Schedule appointment in Low Risk clinic  [facility] 01/15/2012, 2:01 AM

## 2012-01-15 NOTE — Progress Notes (Signed)
Pt was lying on the couch and said she suddenly got wet and she wet the couch where she was laying.

## 2012-01-15 NOTE — Discharge Instructions (Signed)
Bacterial Vaginosis Bacterial vaginosis (BV) is a vaginal infection where the normal balance of bacteria in the vagina is disrupted. The normal balance is then replaced by an overgrowth of certain bacteria. There are several different kinds of bacteria that can cause BV. BV is the most common vaginal infection in women of childbearing age. CAUSES   The cause of BV is not fully understood. BV develops when there is an increase or imbalance of harmful bacteria.   Some activities or behaviors can upset the normal balance of bacteria in the vagina and put women at increased risk including:   Having a new sex partner or multiple sex partners.   Douching.   Using an intrauterine device (IUD) for contraception.   It is not clear what role sexual activity plays in the development of BV. However, women that have never had sexual intercourse are rarely infected with BV.  Women do not get BV from toilet seats, bedding, swimming pools or from touching objects around them.  SYMPTOMS   Grey vaginal discharge.   A fish-like odor with discharge, especially after sexual intercourse.   Itching or burning of the vagina and vulva.   Burning or pain with urination.   Some women have no signs or symptoms at all.  DIAGNOSIS  Your caregiver must examine the vagina for signs of BV. Your caregiver will perform lab tests and look at the sample of vaginal fluid through a microscope. They will look for bacteria and abnormal cells (clue cells), a pH test higher than 4.5, and a positive amine test all associated with BV.  RISKS AND COMPLICATIONS   Pelvic inflammatory disease (PID).   Infections following gynecology surgery.   Developing HIV.   Developing herpes virus.  TREATMENT  Sometimes BV will clear up without treatment. However, all women with symptoms of BV should be treated to avoid complications, especially if gynecology surgery is planned. Female partners generally do not need to be treated. However,  BV may spread between female sex partners so treatment is helpful in preventing a recurrence of BV.   BV may be treated with antibiotics. The antibiotics come in either pill or vaginal cream forms. Either can be used with nonpregnant or pregnant women, but the recommended dosages differ. These antibiotics are not harmful to the baby.   BV can recur after treatment. If this happens, a second round of antibiotics will often be prescribed.   Treatment is important for pregnant women. If not treated, BV can cause a premature delivery, especially for a pregnant woman who had a premature birth in the past. All pregnant women who have symptoms of BV should be checked and treated.   For chronic reoccurrence of BV, treatment with a type of prescribed gel vaginally twice a week is helpful.  HOME CARE INSTRUCTIONS   Finish all medication as directed by your caregiver.   Do not have sex until treatment is completed.   Tell your sexual partner that you have a vaginal infection. They should see their caregiver and be treated if they have problems, such as a mild rash or itching.   Practice safe sex. Use condoms. Only have 1 sex partner.  PREVENTION  Basic prevention steps can help reduce the risk of upsetting the natural balance of bacteria in the vagina and developing BV:  Do not have sexual intercourse (be abstinent).   Do not douche.   Use all of the medicine prescribed for treatment of BV, even if the signs and symptoms go away.     Tell your sex partner if you have BV. That way, they can be treated, if needed, to prevent reoccurrence.  SEEK MEDICAL CARE IF:   Your symptoms are not improving after 3 days of treatment.   You have increased discharge, pain, or fever.  MAKE SURE YOU:   Understand these instructions.   Will watch your condition.   Will get help right away if you are not doing well or get worse.  FOR MORE INFORMATION  Division of STD Prevention (DSTDP), Centers for Disease  Control and Prevention: www.cdc.gov/std American Social Health Association (ASHA): www.ashastd.org  Document Released: 10/23/2005 Document Revised: 10/12/2011 Document Reviewed: 04/15/2009 Exit[employer] Patient Information 2012 [employer]. 

## 2012-01-26 NOTE — ED Provider Notes (Signed)
Attestation of Attending Supervision of Advanced Practitioner: Evaluation and management procedures were performed by the PA/NP/CNM/OB Fellow under my supervision/collaboration. Chart reviewed and agree with management and plan.  Tilda Burrow 01/26/2012 4:35 PM

## 2012-02-02 ENCOUNTER — Encounter (HOSPITAL_COMMUNITY): Payer: Self-pay | Admitting: *Deleted

## 2012-02-02 ENCOUNTER — Inpatient Hospital Stay (HOSPITAL_COMMUNITY)
Admission: AD | Admit: 2012-02-02 | Discharge: 2012-02-03 | Disposition: A | Discharge status: Home / Self Care | Source: Ambulatory Visit | Attending: Obstetrics & Gynecology | Admitting: Obstetrics & Gynecology

## 2012-02-02 DIAGNOSIS — O0930 Supervision of pregnancy with insufficient antenatal care, unspecified trimester: Secondary | ICD-10-CM

## 2012-02-02 DIAGNOSIS — O471 False labor at or after 37 completed weeks of gestation: Secondary | ICD-10-CM

## 2012-02-02 DIAGNOSIS — O479 False labor, unspecified: Secondary | ICD-10-CM | POA: Insufficient documentation

## 2012-02-02 LAB — CULTURE, BETA STREP (GROUP B ONLY): Organism ID, Bacteria: NEGATIVE

## 2012-02-02 NOTE — MAU Provider Note (Signed)
   Theresa [name] is  21 y.o. G2P1001 at [redacted]w[redacted]d presents complaining of Rupture of Membranes and Contractions . She states irregular, every 5 minutes associated with no bleeding, at 1845 tonite had gush of fluid ?vaginal or urine, did not recur. Same as when seen here at 34 wks. Fetus active. No irritative discharge.  NPC.  Obstetrical/Gynecological History: OB History    Grav Para Term Preterm Abortions TAB SAB Ect Mult Living   2 1 1       1       Past Medical History: Past Medical History  Diagnosis Date  . Chlamydia   . Gonorrhea   . Depression     Past Surgical History: Past Surgical History  Procedure Date  . No past surgeries     Family History: Family History  Problem Relation Age of Onset  . Birth defects Neg Hx   . Diabetes Neg Hx   . Stroke Neg Hx   . Hypertension Neg Hx   . Anesthesia problems Neg Hx   . Hypotension Neg Hx   . Malignant hyperthermia Neg Hx   . Pseudochol deficiency Neg Hx     Social History: History  Substance Use Topics  . Smoking status: Former Smoker -- 0.0 packs/day    Types: Cigarettes  . Smokeless tobacco: Not on file  . Alcohol Use: No    Allergies: No Known Allergies  Prescriptions prior to admission  Medication Sig Dispense Refill  . Prenatal Vit-Fe Fumarate-FA (PRENATAL MULTIVITAMIN) TABS Take 1 tablet by mouth daily.        Review of Systems - Negative except as above.  Physical Exam   Blood pressure 112/70, pulse 80, temperature 98 F (36.7 C), temperature source Oral, resp. rate 20, height 5' (1.524 m), weight 65.772 kg (145 lb), last menstrual period 05/31/2011.  General: General appearance - alert, well appearing, and in no distress and oriented to person, place, and time Mental status - normal mood, behavior, speech, dress, motor activity, and thought processes Abdomen - soft, nontender, nondistended, TERM SIZE  Pelvic - normal external genitalia, vulva, vagina, cervix, uterus and adnexa Scant creamy  discharge; cx ft/50/0, cephalic Back exam - full range of motion, no tenderness, palpable spasm or pain on motion, neg cvat Extremities - no pedal edema noted Baseline: 140 bpm, Variability: Good {> 6 bpm), Accelerations: Reactive and Decelerations: Absent irregular, every 5 minutes   Labs:  Results for orders placed during the hospital encounter of 02/02/12 (from the past 24 hour(s))  WET PREP, GENITAL     Status: Abnormal   Collection Time   02/02/12 11:10 PM      Component Value Range   Yeast Wet Prep HPF POC MODERATE (*) NONE SEEN    Trich, Wet Prep NONE SEEN  NONE SEEN    Clue Cells Wet Prep HPF POC FEW (*) NONE SEEN    WBC, Wet Prep HPF POC MANY (*) NONE SEEN     Imaging Studies:  Bedside US by me; normal AFV   Assessment: KEMIA WENDEL is  21 y.o. G2P1001 at [redacted]w[redacted]d presents with false labor, no evidence SROM Yeast vaginitis  Plan: Note to [facility] for weekly care. Labor precautions. Diflucan given here  Joshaua Epple,DEIRDRE3/29/201311:33 PM

## 2012-02-02 NOTE — Progress Notes (Signed)
D. Poe CNM in with pt and spec exam and bimanual done

## 2012-02-02 NOTE — Progress Notes (Signed)
?   Irregularity noted FHR. NO particular pattern

## 2012-02-02 NOTE — MAU Note (Signed)
Leaking fld since about 1800. Has been filling ctxs all day long and now getting worse.

## 2012-02-03 LAB — GC/CHLAMYDIA PROBE AMP, GENITAL
Chlamydia, DNA Probe: NEGATIVE
GC Probe Amp, Genital: NEGATIVE

## 2012-02-03 MED ORDER — FLUCONAZOLE 150 MG PO TABS
150.0000 mg | ORAL_TABLET | Freq: Once | ORAL | Status: AC
  Administered 2012-02-03: 150 mg via ORAL
  Filled 2012-02-03: qty 1

## 2012-02-03 NOTE — MAU Note (Signed)
DChristy [name] CNM at bedside with portable u/s to check fld level

## 2012-02-03 NOTE — Discharge Instructions (Signed)

## 2012-02-03 NOTE — Progress Notes (Signed)
Written and verbal d/c instructions given and understanding voiced. 

## 2012-02-03 NOTE — Progress Notes (Signed)
EFm d/ced by D. Poe CNM

## 2012-02-08 LAB — CULTURE, BETA STREP (GROUP B ONLY)

## 2012-02-14 ENCOUNTER — Encounter

## 2012-02-19 ENCOUNTER — Encounter: Admitting: Physician Assistant

## 2012-02-20 ENCOUNTER — Encounter (HOSPITAL_COMMUNITY): Payer: Self-pay | Admitting: Anesthesiology

## 2012-02-20 ENCOUNTER — Encounter (HOSPITAL_COMMUNITY): Payer: Self-pay | Admitting: *Deleted

## 2012-02-20 ENCOUNTER — Encounter (HOSPITAL_COMMUNITY): Payer: Self-pay | Admitting: Obstetrics

## 2012-02-20 ENCOUNTER — Inpatient Hospital Stay (HOSPITAL_COMMUNITY): Payer: Self-pay | Admitting: Anesthesiology

## 2012-02-20 ENCOUNTER — Inpatient Hospital Stay (HOSPITAL_COMMUNITY)
Admission: AD | Admit: 2012-02-20 | Discharge: 2012-02-22 | DRG: 775 | Disposition: A | Discharge status: Home / Self Care | Payer: Self-pay | Source: Ambulatory Visit | Attending: Obstetrics and Gynecology | Admitting: Obstetrics and Gynecology

## 2012-02-20 DIAGNOSIS — O0930 Supervision of pregnancy with insufficient antenatal care, unspecified trimester: Secondary | ICD-10-CM

## 2012-02-20 DIAGNOSIS — IMO0001 Reserved for inherently not codable concepts without codable children: Secondary | ICD-10-CM

## 2012-02-20 DIAGNOSIS — Z3480 Encounter for supervision of other normal pregnancy, unspecified trimester: Secondary | ICD-10-CM

## 2012-02-20 LAB — CBC
HCT: 33.4 % — ABNORMAL LOW (ref 36.0–46.0)
Hemoglobin: 10.9 g/dL — ABNORMAL LOW (ref 12.0–15.0)
MCH: 27.4 pg (ref 26.0–34.0)
MCHC: 32.6 g/dL (ref 30.0–36.0)

## 2012-02-20 LAB — RAPID URINE DRUG SCREEN, HOSP PERFORMED
Amphetamines: NOT DETECTED
Barbiturates: NOT DETECTED
Tetrahydrocannabinol: NOT DETECTED

## 2012-02-20 LAB — STREP B DNA PROBE: GBS: NEGATIVE

## 2012-02-20 MED ORDER — HYDROXYZINE HCL 50 MG PO TABS: 50.0000 mg | ORAL_TABLET | Freq: Four times a day (QID) | ORAL | Status: DC | PRN

## 2012-02-20 MED ORDER — OXYTOCIN 20 UNITS IN LACTATED RINGERS INFUSION - SIMPLE
1.0000 m[IU]/min | INTRAVENOUS | Status: DC
  Administered 2012-02-20: 1 m[IU]/min via INTRAVENOUS
  Filled 2012-02-20: qty 1000

## 2012-02-20 MED ORDER — LACTATED RINGERS IV SOLN: 500.0000 mL | Freq: Once | INTRAVENOUS | Status: DC

## 2012-02-20 MED ORDER — HYDROXYZINE HCL 50 MG/ML IM SOLN: 50.0000 mg | Freq: Four times a day (QID) | INTRAMUSCULAR | Status: DC | PRN

## 2012-02-20 MED ORDER — OXYCODONE-ACETAMINOPHEN 5-325 MG PO TABS: 1.0000 | ORAL_TABLET | ORAL | Status: DC | PRN

## 2012-02-20 MED ORDER — OXYTOCIN BOLUS FROM INFUSION
500.0000 mL | Freq: Once | INTRAVENOUS | Status: DC
  Filled 2012-02-20: qty 500

## 2012-02-20 MED ORDER — LIDOCAINE HCL (PF) 1 % IJ SOLN
INTRAMUSCULAR | Status: DC | PRN
  Administered 2012-02-20 (×2): 8 mL

## 2012-02-20 MED ORDER — CITRIC ACID-SODIUM CITRATE 334-500 MG/5ML PO SOLN: 30.0000 mL | ORAL | Status: DC | PRN

## 2012-02-20 MED ORDER — FENTANYL 2.5 MCG/ML BUPIVACAINE 1/10 % EPIDURAL INFUSION (WH - ANES)
14.0000 mL/h | INTRAMUSCULAR | Status: DC
  Filled 2012-02-20: qty 60

## 2012-02-20 MED ORDER — ACETAMINOPHEN 325 MG PO TABS: 650.0000 mg | ORAL_TABLET | ORAL | Status: DC | PRN

## 2012-02-20 MED ORDER — IBUPROFEN 600 MG PO TABS: 600.0000 mg | ORAL_TABLET | Freq: Four times a day (QID) | ORAL | Status: DC | PRN

## 2012-02-20 MED ORDER — DIPHENHYDRAMINE HCL 50 MG/ML IJ SOLN: 12.5000 mg | INTRAMUSCULAR | Status: DC | PRN

## 2012-02-20 MED ORDER — PHENYLEPHRINE 40 MCG/ML (10ML) SYRINGE FOR IV PUSH (FOR BLOOD PRESSURE SUPPORT)
80.0000 ug | PREFILLED_SYRINGE | INTRAVENOUS | Status: DC | PRN
  Filled 2012-02-20: qty 5

## 2012-02-20 MED ORDER — LACTATED RINGERS IV SOLN
INTRAVENOUS | Status: DC
  Administered 2012-02-20: 20:00:00 via INTRAVENOUS

## 2012-02-20 MED ORDER — PHENYLEPHRINE 40 MCG/ML (10ML) SYRINGE FOR IV PUSH (FOR BLOOD PRESSURE SUPPORT): 80.0000 ug | PREFILLED_SYRINGE | INTRAVENOUS | Status: DC | PRN

## 2012-02-20 MED ORDER — EPHEDRINE 5 MG/ML INJ
10.0000 mg | INTRAVENOUS | Status: DC | PRN
  Filled 2012-02-20: qty 4

## 2012-02-20 MED ORDER — OXYTOCIN 20 UNITS IN LACTATED RINGERS INFUSION - SIMPLE: 125.0000 mL/h | Freq: Once | INTRAVENOUS | Status: DC

## 2012-02-20 MED ORDER — NALBUPHINE SYRINGE 5 MG/0.5 ML
5.0000 mg | INJECTION | INTRAMUSCULAR | Status: DC | PRN
  Administered 2012-02-20: 10 mg via INTRAVENOUS
  Administered 2012-02-20 (×2): 5 mg via INTRAVENOUS
  Filled 2012-02-20: qty 0.5
  Filled 2012-02-20: qty 1
  Filled 2012-02-20: qty 0.5

## 2012-02-20 MED ORDER — TERBUTALINE SULFATE 1 MG/ML IJ SOLN: 0.2500 mg | Freq: Once | INTRAMUSCULAR | Status: AC | PRN

## 2012-02-20 MED ORDER — ONDANSETRON HCL 4 MG/2ML IJ SOLN
4.0000 mg | Freq: Four times a day (QID) | INTRAMUSCULAR | Status: DC | PRN
  Administered 2012-02-20: 4 mg via INTRAVENOUS
  Filled 2012-02-20: qty 2

## 2012-02-20 MED ORDER — LACTATED RINGERS IV SOLN
500.0000 mL | INTRAVENOUS | Status: DC | PRN
  Administered 2012-02-20: 500 mL via INTRAVENOUS

## 2012-02-20 MED ORDER — EPHEDRINE 5 MG/ML INJ: 10.0000 mg | INTRAVENOUS | Status: DC | PRN

## 2012-02-20 MED ORDER — LIDOCAINE HCL (PF) 1 % IJ SOLN
30.0000 mL | INTRAMUSCULAR | Status: DC | PRN
  Filled 2012-02-20: qty 30

## 2012-02-20 MED ORDER — FLEET ENEMA 7-19 GM/118ML RE ENEM: 1.0000 | ENEMA | RECTAL | Status: DC | PRN

## 2012-02-20 MED ORDER — FENTANYL 2.5 MCG/ML BUPIVACAINE 1/10 % EPIDURAL INFUSION (WH - ANES)
INTRAMUSCULAR | Status: DC | PRN
  Administered 2012-02-20: 14 mL/h via EPIDURAL

## 2012-02-20 NOTE — Anesthesia Preprocedure Evaluation (Signed)
Anesthesia Evaluation  Patient identified by MRN, date of birth, ID band Patient awake    Reviewed: Allergy & Precautions, H&P , NPO status , Patient's Chart, lab work & pertinent test results  Airway Mallampati: I TM Distance: >3 FB Neck ROM: full    Dental No notable dental hx.    Pulmonary neg pulmonary ROS,    Pulmonary exam normal       Cardiovascular negative cardio ROS      Neuro/Psych PSYCHIATRIC DISORDERS Depression negative neurological ROS     GI/Hepatic negative GI ROS, Neg liver ROS,   Endo/Other  negative endocrine ROS  Renal/GU negative Renal ROS  negative genitourinary   Musculoskeletal negative musculoskeletal ROS (+)   Abdominal Normal abdominal exam  (+)   Peds negative pediatric ROS (+)  Hematology negative hematology ROS (+)   Anesthesia Other Findings   Reproductive/Obstetrics (+) Pregnancy                           Anesthesia Physical Anesthesia Plan  ASA: II  Anesthesia Plan: Epidural   Post-op Pain Management:    Induction:   Airway Management Planned:   Additional Equipment:   Intra-op Plan:   Post-operative Plan:   Informed Consent: I have reviewed the patients History and Physical, chart, labs and discussed the procedure including the risks, benefits and alternatives for the proposed anesthesia with the patient or authorized representative who has indicated his/her understanding and acceptance.     Plan Discussed with:   Anesthesia Plan Comments:         Anesthesia Quick Evaluation  

## 2012-02-20 NOTE — H&P (Signed)
HPI: Theresa [name] is a 21 y.o. year old G77P1001 female at [redacted]w[redacted]d weeks gestation by LMP, verified by second trimester [place] who presents to MAU reporting Labor and bloody show. She denies LOF. She has not received formal [renatal care, but has had multiple MAU visits. All prenatal labs drawn (except 1 hour GTT) and anatomy US done, normal. Pt was supposed to start care at [facility], but did not keep appts due to being in jail due to parole violation.   Maternal Medical History:  Reason for admission: Reason for admission: contractions.  Reason for Admission:   nauseaContractions: Onset was 3-5 hours ago.   Frequency: regular.   Perceived severity is strong.    Fetal activity: Perceived fetal activity is normal.   Last perceived fetal movement was within the past hour.    Prenatal complications: No prenatal care  Prenatal Complications - Diabetes: Not tested  OB History    Grav Para Term Preterm Abortions TAB SAB Ect Mult Living   2 1 1       1      Past Medical History  Diagnosis Date  . Chlamydia   . Gonorrhea   . Depression    Past Surgical History  Procedure Date  . No past surgeries    Family History: family history includes Asthma in her daughter.  There is no history of Birth defects, and Diabetes, and Stroke, and Hypertension, and Anesthesia problems, and Hypotension, and Malignant hyperthermia, and Pseudochol deficiency, . Social History:  reports that she has quit smoking. Her smoking use included Cigarettes. She smoked 0 packs per day. She has never used smokeless tobacco. She reports that she does not drink alcohol or use illicit drugs.  Review of Systems  Constitutional: Negative for fever and chills.  Eyes: Negative for blurred vision and double vision.  Cardiovascular: Negative for chest pain.  Gastrointestinal: Positive for abdominal pain. Negative for heartburn, nausea, vomiting, diarrhea and constipation.  Genitourinary: Negative for dysuria.  Neurological: Negative  for headaches.    Dilation: 6 Effacement (%): 80 Station: -2 Exam by:: dr. Rivka Safer Blood pressure 92/45, pulse 84, temperature 97.9 F (36.6 C), temperature source Oral, resp. rate 20, height 5\' 1"  (1.549 m), weight 68.402 kg (150 lb 12.8 oz), last menstrual period 05/31/2011, SpO2 99.00%. Maternal Exam:  Uterine Assessment: Contraction strength is moderate.  Contraction duration is 1 minute. Contraction frequency is regular.   Abdomen: Fundal height is S=D.   Fetal presentation: vertex  Introitus: Normal vulva. Normal vagina.  Pelvis: adequate for delivery.   Cervix: Cervix evaluated by digital exam.     Fetal Exam Fetal Monitor Review: Mode: ultrasound.   Baseline rate: 140.  Variability: moderate (6-25 bpm).   Pattern: accelerations present and no decelerations.    Fetal State Assessment: Category I - tracings are normal.     Physical Exam  Nursing note and vitals reviewed. Constitutional: She is oriented to person, place, and time. She appears well-developed and well-nourished. She appears distressed (moderate).  HENT:  Head: Normocephalic.  Eyes: Pupils are equal, round, and reactive to light.  Cardiovascular: Normal rate and regular rhythm.   Respiratory: Effort normal and breath sounds normal.  GI: Soft. There is no tenderness.  Genitourinary: Uterus normal. There is no lesion on the right labia. There is no lesion on the left labia. There is bleeding (normal bloody show) around the vagina.  Musculoskeletal: Normal range of motion.  Lymphadenopathy:       Right: No inguinal adenopathy present.  Left: No inguinal adenopathy present.  Neurological: She is alert and oriented to person, place, and time. She has normal reflexes.  Skin: Skin is warm and dry.  Psychiatric: She has a normal mood and affect.    Prenatal labs: ABO, Rh: --/--/B POS (12/28 1955) Antibody: NEG (12/28 1955) Rubella: 87.7 (12/28 1955) RPR: NON REACTIVE (12/28 1955)  HBsAg: NEGATIVE  (12/28 1955)  HIV: NON REACTIVE (12/28 1955)  GBS: Negative (04/16 0000)  No genetic screening done No 1 hour GTT done  Assessment: 1. Labor: active 2. Fetal Wellbeing: Category I  3. Pain Control: requesting IV pain meds. Plans epidural. 4. GBS: neg 5. 39.6 week IUP 6. No prenatal care  Plan:  1. Admit to BS per consult with MD 2. Routine L&D orders 3. Analgesia/anesthesia PRN  4. UDS  Laurajean Hosek 02/20/2012, 11:50

## 2012-02-20 NOTE — Progress Notes (Deleted)
Theresa [name] is a 21 y.o. G2P1001 at [redacted]w[redacted]d by ultrasound admitted for active labor  Subjective: Minimal nausea.  She has received her epidural. One dose of stadol for pain. Denies chest pain and shortness of breath  Objective: BP 107/62  Pulse 89  Temp(Src) 97.6 F (36.4 C) (Oral)  Resp 18  Ht 5\' 1"  (1.549 m)  Wt 150 lb 12.8 oz (68.402 kg)  BMI 28.49 kg/m2  SpO2 99%  LMP 05/31/2011     FHT:  FHR: 145 bpm, variability: moderate,  accelerations:  Present,  decelerations:  Absent UC:   regular, every 4 minutes SVE:   Dilation: 7 Effacement (%): 70 Station: -1 Exam by:: Edd Arbour MD  Labs: Lab Results  Component Value Date   WBC 14.0* 02/20/2012   HGB 10.9* 02/20/2012   HCT 33.4* 02/20/2012   MCV 83.9 02/20/2012   PLT 253 02/20/2012    Assessment / Plan: Spontaneous labor, progressing normally  Labor: Progressing normally Preeclampsia:  no signs or symptoms of toxicity Fetal Wellbeing:  Category I Pain Control:  Epidural I/D:  n/a Anticipated MOD:  NSVD  Farhan Jean 02/20/2012, 3:18 PM

## 2012-02-20 NOTE — MAU Note (Signed)
Patient states she is having contractions about every 6 minutes. Denies any bleeding or leaking with a heavier vaginal discharge. Reports good fetal movement.

## 2012-02-20 NOTE — Anesthesia Procedure Notes (Signed)
Epidural Patient location during procedure: OB Start time: 02/20/2012 6:13 PM End time: 02/20/2012 6:20 PM Reason for block: procedure for pain  Staffing Anesthesiologist: Sandrea Hughs  Preanesthetic Checklist Completed: patient identified, site marked, surgical consent, pre-op evaluation, timeout performed, IV checked, risks and benefits discussed and monitors and equipment checked  Epidural Patient position: sitting Prep: site prepped and draped and DuraPrep Patient monitoring: continuous pulse ox and blood pressure Approach: midline Injection technique: LOR air  Needle:  Needle type: Tuohy  Needle gauge: 17 G Needle length: 9 cm Needle insertion depth: 5 cm cm Catheter type: closed end flexible Catheter size: 19 Gauge Catheter at skin depth: 10 cm Test dose: negative and Other  Assessment Sensory level: T8 Events: blood not aspirated, injection not painful, no injection resistance, negative IV test and no paresthesia

## 2012-02-20 NOTE — Progress Notes (Signed)
Patient ID: Theresa [name], female   DOB: 07/01/1991, 22 y.o.   MRN: [id number] Reports increased pain Minimal change in cx exam (now 7cm two hours later) Ctx q 6-9 mins Will administer epidural per pt request and begin Pitocin Dagoberto Ligas 02/20/2012 6:12 PM

## 2012-02-20 NOTE — Progress Notes (Signed)
Theresa [name] is a 21 y.o. G2P1001 at [redacted]w[redacted]d by ultrasound admitted for active labor  Subjective:   Objective: BP 107/43  Pulse 81  Temp(Src) 98.2 F (36.8 C) (Oral)  Resp 18  Ht 5\' 1"  (1.549 m)  Wt 150 lb 12.8 oz (68.402 kg)  BMI 28.49 kg/m2  SpO2 99%  LMP 05/31/2011      FHT:  FHR: 115 bpm, variability: moderate,  accelerations:  Present,  decelerations:  Absent UC:   regular, every 8 minutes SVE:   Dilation: 6 Effacement (%): 80 Station: -2 Exam by:: dr. Rivka Safer  Labs: Lab Results  Component Value Date   WBC 14.0* 02/20/2012   HGB 10.9* 02/20/2012   HCT 33.4* 02/20/2012   MCV 83.9 02/20/2012   PLT 253 02/20/2012    Assessment / Plan: Spontaneous labor, progressing normally  Labor: Progressing normally Preeclampsia:  no signs or symptoms of toxicity Fetal Wellbeing:  Category I Pain Control:  stadol I/D:  n/a Anticipated MOD:  NSVD  Theresa Mula MD 02/20/2012, 3:33 PM

## 2012-02-20 NOTE — Progress Notes (Signed)
Theresa [name] is a 21 y.o. G2P1001 at [redacted]w[redacted]d   Subjective: Doing well. No urge to push. Pain well controlled. SROM approximately ago.   Objective: BP 104/70  Pulse 93  Temp(Src) 98.3 F (36.8 C) (Oral)  Resp 18  Ht 5\' 1"  (1.549 m)  Wt 68.402 kg (150 lb 12.8 oz)  BMI 28.49 kg/m2  SpO2 99%  LMP 05/31/2011      FHT:  FHR: 120s bpm, variability: moderate,  accelerations:  Present,  decelerations:  Present Variable UC:   regular, every 4 minutes SVE:   Dilation: 10 Effacement (%): 100 Station: 0 Exam by:: K fraley RN  Labs: Lab Results  Component Value Date   WBC 14.0* 02/20/2012   HGB 10.9* 02/20/2012   HCT 33.4* 02/20/2012   MCV 83.9 02/20/2012   PLT 253 02/20/2012    Assessment / Plan: Spontaneous labor, progressing normally SROM  Labor: Progressing normally on Pit Fetal Wellbeing:  Category I Pain Control:  Epidural I/D:  n/a Anticipated MOD:  NSVD  Casia Corti 02/20/2012, 9:15 PM

## 2012-02-21 MED ORDER — DIBUCAINE 1 % RE OINT: 1.0000 "application " | TOPICAL_OINTMENT | RECTAL | Status: DC | PRN

## 2012-02-21 MED ORDER — TETANUS-DIPHTH-ACELL PERTUSSIS 5-2.5-18.5 LF-MCG/0.5 IM SUSP: 0.5000 mL | Freq: Once | INTRAMUSCULAR | Status: DC

## 2012-02-21 MED ORDER — SIMETHICONE 80 MG PO CHEW: 80.0000 mg | CHEWABLE_TABLET | ORAL | Status: DC | PRN

## 2012-02-21 MED ORDER — BENZOCAINE-MENTHOL 20-0.5 % EX AERO
INHALATION_SPRAY | CUTANEOUS | Status: AC
  Administered 2012-02-21: 07:00:00
  Filled 2012-02-21: qty 56

## 2012-02-21 MED ORDER — OXYCODONE-ACETAMINOPHEN 5-325 MG PO TABS
1.0000 | ORAL_TABLET | ORAL | Status: DC | PRN
  Administered 2012-02-21 (×2): 1 via ORAL
  Filled 2012-02-21 (×2): qty 1

## 2012-02-21 MED ORDER — PRENATAL MULTIVITAMIN CH
1.0000 | ORAL_TABLET | Freq: Every day | ORAL | Status: DC
  Administered 2012-02-21 – 2012-02-22 (×2): 1 via ORAL
  Filled 2012-02-21 (×2): qty 1

## 2012-02-21 MED ORDER — SENNOSIDES-DOCUSATE SODIUM 8.6-50 MG PO TABS: 2.0000 | ORAL_TABLET | Freq: Every day | ORAL | Status: DC

## 2012-02-21 MED ORDER — ONDANSETRON HCL 4 MG/2ML IJ SOLN: 4.0000 mg | INTRAMUSCULAR | Status: DC | PRN

## 2012-02-21 MED ORDER — ONDANSETRON HCL 4 MG PO TABS: 4.0000 mg | ORAL_TABLET | ORAL | Status: DC | PRN

## 2012-02-21 MED ORDER — BENZOCAINE-MENTHOL 20-0.5 % EX AERO: 1.0000 "application " | INHALATION_SPRAY | CUTANEOUS | Status: DC | PRN

## 2012-02-21 MED ORDER — LANOLIN HYDROUS EX OINT: TOPICAL_OINTMENT | CUTANEOUS | Status: DC | PRN

## 2012-02-21 MED ORDER — DIPHENHYDRAMINE HCL 25 MG PO CAPS: 25.0000 mg | ORAL_CAPSULE | Freq: Four times a day (QID) | ORAL | Status: DC | PRN

## 2012-02-21 MED ORDER — IBUPROFEN 600 MG PO TABS
600.0000 mg | ORAL_TABLET | Freq: Four times a day (QID) | ORAL | Status: DC
  Administered 2012-02-21 – 2012-02-22 (×5): 600 mg via ORAL
  Filled 2012-02-21 (×3): qty 1
  Filled 2012-02-21: qty 2

## 2012-02-21 MED ORDER — ZOLPIDEM TARTRATE 5 MG PO TABS: 5.0000 mg | ORAL_TABLET | Freq: Every evening | ORAL | Status: DC | PRN

## 2012-02-21 MED ORDER — WITCH HAZEL-GLYCERIN EX PADS: 1.0000 "application " | MEDICATED_PAD | CUTANEOUS | Status: DC | PRN

## 2012-02-21 NOTE — Progress Notes (Signed)
In to check patient; pt states ambulated by herself to bathroom.  Re-educated patient on need to call for assistance with ambulation and risk for falling until pt is released by nurse to ambulate on her own.  Pt continues to c/o legs feeling numb.  States she isn't patient and doesn't like to wait for help.  Call bell within reach; visitor at bedside; bed in lowest position.

## 2012-02-21 NOTE — Anesthesia Postprocedure Evaluation (Signed)
  Anesthesia Post-op Note  Patient: Theresa [name]  Procedure(s) Performed: * No procedures listed *  Patient Location: Mother/Baby  Anesthesia Type: Epidural  Level of Consciousness: awake, alert  and oriented  Airway and Oxygen Therapy: Patient Spontanous Breathing  Post-op Pain: none  Post-op Assessment: Post-op Vital signs reviewed, Patient's Cardiovascular Status Stable, No headache, No backache, No residual numbness and No residual motor weakness  Post-op Vital Signs: Reviewed and stable  Complications: No apparent anesthesia complications

## 2012-02-21 NOTE — Progress Notes (Signed)
I have seen and examined this patient and I agree with the above. Brewster, Enis Riecke 9:08 AM 02/21/2012

## 2012-02-21 NOTE — Progress Notes (Signed)
Clinical Social Work Department PSYCHOSOCIAL ASSESSMENT - MATERNAL/CHILD 02/21/2012  Patient:  Theresa [name]  Account Number:  [id number]  Admit Date:  02/20/2012  Childs Name:   Theresa [name]    Clinical Social Worker:  Cooper Stamp, LCSWA   Date/Time:  02/21/2012 10:30 AM  Date Referred:  02/21/2012   Referral source  CN     Referred reason  Other - See comment   Other referral source:    I:  FAMILY / HOME ENVIRONMENT Child's legal guardian:  PARENT  Guardian - Name Guardian - Age Guardian - Address  Theresa [name] [address] [place], [place] [address]  Theresa [name] 22 [place], [place]   Other household support members/support persons Name Relationship DOB  Theresa [name] FRIEND    Other support:   Theresa [name], mother    II  PSYCHOSOCIAL DATA Information Source:    Financial and Community Resources Employment:   Financial resources:  Self Pay If Medicaid - County:   Other  [employer]   School / Grade:   Maternity Care Coordinator / Child Services Coordination / Early Interventions:  Cultural issues impacting care:    III  STRENGTHS Strengths  Adequate Resources  Home prepared for Child (including basic supplies)  Supportive family/friends   Strength comment:    IV  RISK FACTORS AND CURRENT PROBLEMS Current Problem:  None   Risk Factor & Current Problem Patient Issue Family Issue Risk Factor / Current Problem Comment   N N NPNC   N N Recently released from jail    V  SOCIAL WORK ASSESSMENT Sw referral received to assess reason for NPNC and recent incarceration.  Pt told Sw that she did not get PNC because she was "on the run."  Pt told Sw that she went "far away," in an effort to avoid incarceration.  Pt was charged with an assault charge in 2010 and placed on probation. According to the pt, she violated probation in Nov. 12 and served time in jail, (09/23/11-10/23/11).  She then violated probation a second time and was incarcerated, 01/26/12-02/06/12.  She is  currently on probation for 3 years and required to wear an ankle bracelet (with curfew of 6pm).  Pt is "relieved" that she is facing her past criminal convictions, as she told Sw that she was tired looking over her shoulders.  Pt's probation officer is aware that she is hospitalized at this time. In addition to pt's involvement with the law, she told Sw that she did not have transportation to attend PNC appointments.  She denies illegal substance use and verbalize understanding of hospital drug testing policy. UDS collection pending, as well as meconium results.  She does not have Medicaid, WIC or Food stamps.  Sw encouraged her to apply.  Pt's daughter, Theresa [name] (DOB 09/28/09) lives with her mother.  She denies any past or current CPS involvement.  Sw confirmed with CPS intake worker that pt does not have an open case.  She reports having all necessary supplies for the infant and adequate support.  Sw will follow up with drug screen results and make a referral if needed.      VI SOCIAL WORK PLAN Social Work Plan  No Further Intervention Required / No Barriers to Discharge   Type of pt/family education:   If child protective services report - county:   If child protective services report - date:   Information/referral to community resources comment:   Other social work plan:      

## 2012-02-21 NOTE — Progress Notes (Signed)
UR chart review completed.  

## 2012-02-21 NOTE — Progress Notes (Addendum)
Post Partum Day 1 Subjective: no complaints, up ad lib, voiding, tolerating PO and + flatus  Objective: Blood pressure 123/66, pulse 84, temperature 98 F (36.7 C), temperature source Oral, resp. rate 20, height 5\' 1"  (1.549 m), weight 68.402 kg (150 lb 12.8 oz), last menstrual period 05/31/2011, SpO2 100.00%, unknown if currently breastfeeding.  Physical Exam:  General: alert, cooperative, appears stated age and no distress Lochia: appropriate Uterine Fundus: firm DVT Evaluation: No evidence of DVT seen on physical exam.   Basename 02/20/12 1220  HGB 10.9*  HCT 33.4*    Assessment/Plan: Plan for discharge tomorrow, Breastfeeding, Lactation consult and Social Work Surveyor, quantity on Depot for contraception. To call clinic to set up appt.    LOS: 1 day   Taunja Brickner 02/21/2012, 7:33 AM

## 2012-02-22 MED ORDER — OXYCODONE-ACETAMINOPHEN 5-325 MG PO TABS: 1.0000 | ORAL_TABLET | ORAL | Status: DC | PRN

## 2012-02-22 MED ORDER — IBUPROFEN 600 MG PO TABS: 600.0000 mg | ORAL_TABLET | Freq: Four times a day (QID) | ORAL | Status: AC

## 2012-02-22 NOTE — Discharge Summary (Addendum)
Obstetric Discharge Summary Reason for Admission: onset of labor (no PNC) Prenatal Procedures: none Intrapartum Procedures: spontaneous vaginal delivery Postpartum Procedures: none Complications-Operative and Postpartum: none Hemoglobin  Date Value Range Status  02/20/2012 10.9* 12.0-15.0 (g/dL) Final     HCT  Date Value Range Status  02/20/2012 33.4* 36.0-46.0 (%) Final    Physical Exam:  General: alert, cooperative, appears stated age and no distress Lochia: appropriate Uterine Fundus: firm DVT Evaluation: No evidence of DVT seen on physical exam.  Discharge Diagnoses: Term Pregnancy-delivered  Discharge Information: Date: 02/22/2012 Activity: unrestricted and pelvic rest Diet: routine Medications: Ibuprofen  Condition: stable Instructions: refer to practice specific booklet Discharge to: home (SW has cleared for DC. Pt on house arrest) Mother to contact clinic for Depot shot  Newborn Data: Live born female  Birth Weight: 7 lb 5.6 oz (3335 g) APGAR: 7, 9  Home with mother.  Jeffifer Rabold 02/22/2012, 7:33 AM   I have seen and examined this patient and agree the above assessment. CRESENZO-DISHMAN,FRANCES 02/22/2012 7:48 AM

## 2012-02-26 NOTE — H&P (Signed)
Agree with above note.  Dyanna Seiter 02/26/2012 9:41 AM   

## 2012-03-20 ENCOUNTER — Ambulatory Visit: Admitting: Family

## 2013-01-15 ENCOUNTER — Encounter (HOSPITAL_COMMUNITY): Payer: Self-pay | Admitting: *Deleted

## 2013-01-15 ENCOUNTER — Inpatient Hospital Stay (HOSPITAL_COMMUNITY)
Admission: AD | Admit: 2013-01-15 | Discharge: 2013-01-15 | Disposition: A | Discharge status: Home / Self Care | Payer: Self-pay | Source: Ambulatory Visit | Attending: Obstetrics and Gynecology | Admitting: Obstetrics and Gynecology

## 2013-01-15 DIAGNOSIS — Z3202 Encounter for pregnancy test, result negative: Secondary | ICD-10-CM | POA: Insufficient documentation

## 2013-01-15 DIAGNOSIS — N926 Irregular menstruation, unspecified: Secondary | ICD-10-CM | POA: Insufficient documentation

## 2013-01-15 HISTORY — DX: Tubulo-interstitial nephritis, not specified as acute or chronic: N12

## 2013-01-15 LAB — URINALYSIS, ROUTINE W REFLEX MICROSCOPIC
Bilirubin Urine: NEGATIVE
Leukocytes, UA: NEGATIVE
Nitrite: NEGATIVE
Specific Gravity, Urine: 1.02 (ref 1.005–1.030)
pH: 6.5 (ref 5.0–8.0)

## 2013-01-15 LAB — WET PREP, GENITAL
Trich, Wet Prep: NONE SEEN
Yeast Wet Prep HPF POC: NONE SEEN

## 2013-01-15 LAB — URINE MICROSCOPIC-ADD ON

## 2013-01-15 NOTE — MAU Note (Signed)
Patient states she has been in jail for the past 3 months and has been having irregular bleeding. Not sure about being pregnant. No pain. No bleeding at this time.

## 2013-01-15 NOTE — MAU Provider Note (Signed)
History     CSN: [id number]  Arrival date and time: 01/15/13 1706   First Provider Initiated Contact with Patient 01/15/13 1820      Chief Complaint  Patient presents with  . Possible Pregnancy   HPI  Recent incarceration, from dec 10 to Feb 27 th. Has ? If she could be pregnant, Had NEG urine preg test upon admit to incarceration, C/o irreg cycles. LMP began Mar 8-11. Having bleeding 2 x / month over last 3 months. Denies sex since release from prison.([place] Sexually active without BC from delivery last April til Dec. Denies sex during incarceration. None since per pt hx.  Desires BC--Depo provera to be referred to [facility]    Pertinent Gynecological History: Menses: flow is light and irregular occurring approximately every 14-20 days with spotting approximately 3-4days per episode of bleeding , occurring every 14-20 days per month Bleeding: dysfunctional uterine bleeding Contraception: none DES exposure: unknown Blood transfusions: none Sexually transmitted diseases: past history: GC/CHl/ Previous GYN Procedures:   Last mammogram:  Date:  Last pap: last preg Date: delivery 02/20/2012   Past Medical History  Diagnosis Date  . Chlamydia   . Gonorrhea   . Depression   . Pyelonephritis     X 2    Past Surgical History  Procedure Laterality Date  . No past surgeries      Family History  Problem Relation Age of Onset  . Birth defects Neg Hx   . Diabetes Neg Hx   . Stroke Neg Hx   . Hypertension Neg Hx   . Anesthesia problems Neg Hx   . Hypotension Neg Hx   . Malignant hyperthermia Neg Hx   . Pseudochol deficiency Neg Hx   . Asthma Daughter     History  Substance Use Topics  . Smoking status: Current Every Day Smoker -- 0.50 packs/day    Types: Cigarettes  . Smokeless tobacco: Never Used  . Alcohol Use: Yes     Comment: occas.    Allergies: No Known Allergies  Prescriptions prior to admission  Medication Sig Dispense Refill  . acetaminophen  (TYLENOL) 325 MG tablet Take 650 mg by mouth every 4 (four) hours as needed for pain.        ROS Physical Exam   Blood pressure 103/61, pulse 82, temperature 98 F (36.7 C), temperature source Oral, resp. rate 16, height 5\' 1"  (1.549 m), weight 129 lb 3.2 oz (58.605 kg), last menstrual period 01/09/2013, SpO2 100.00%.  Physical Exam Physical Examination: General appearance - alert, well appearing, and in no distress and oriented to person, place, and time Mental status - alert, oriented to person, place, and time, normal mood, behavior, speech, dress, motor activity, and thought processes Abdomen - soft, nontender, nondistended, no masses or organomegaly Pelvic -  VULVA: normal appearing vulva with no masses, tenderness or lesions,  VAGINA: normal appearing vagina with normal color and discharge, no lesions, light pink discoloration of d/c, no,  CERVIX: lesions absent, light pink d/c, consistent with recent menses,   UTERUS: uterus is normal size, shape, consistency and nontender, mobile,  ADNEXA: normal adnexa in size, nontender and no masses,  WET MOUNT done - results: , exam chaperoned by RN Extremities - peripheral pulses nor mal, no pedal edema, no clubbing or cyanosis, Homan's sign negative bilaterally   MAU Course  Procedures  Speculum exam. Bimanual, cultures. Wet prep NEG for trich, Pos for few wbc, no yeast.  MDM Hx, exam, review of results  Assessment  and Plan  Not pregnant Resolving menses Needs contraception Plan  Reassured re findings, results Discussed need for [facility], Referred to [facility] for DepoProvera asap. Pt states she can go this week and will call    FERGUSON,JOHN V 01/15/2013, 6:40 PM

## 2013-01-22 ENCOUNTER — Inpatient Hospital Stay (HOSPITAL_COMMUNITY)
Admission: AD | Admit: 2013-01-22 | Discharge: 2013-01-22 | Disposition: A | Discharge status: Home / Self Care | Payer: Self-pay | Source: Ambulatory Visit | Attending: Family Medicine | Admitting: Family Medicine

## 2013-01-22 DIAGNOSIS — A64 Unspecified sexually transmitted disease: Secondary | ICD-10-CM | POA: Insufficient documentation

## 2013-01-22 MED ORDER — AZITHROMYCIN 250 MG PO TABS
1000.0000 mg | ORAL_TABLET | Freq: Once | ORAL | Status: AC
  Administered 2013-01-22: 1000 mg via ORAL
  Filled 2013-01-22: qty 4

## 2013-11-09 ENCOUNTER — Encounter (HOSPITAL_COMMUNITY): Payer: Self-pay | Admitting: *Deleted

## 2013-11-09 ENCOUNTER — Inpatient Hospital Stay (HOSPITAL_COMMUNITY)
Admission: AD | Admit: 2013-11-09 | Discharge: 2013-11-09 | Disposition: A | Discharge status: Home / Self Care | Payer: Self-pay | Source: Ambulatory Visit | Attending: Obstetrics & Gynecology | Admitting: Obstetrics & Gynecology

## 2013-11-09 DIAGNOSIS — A5901 Trichomonal vulvovaginitis: Secondary | ICD-10-CM | POA: Insufficient documentation

## 2013-11-09 DIAGNOSIS — R350 Frequency of micturition: Secondary | ICD-10-CM | POA: Insufficient documentation

## 2013-11-09 DIAGNOSIS — F17200 Nicotine dependence, unspecified, uncomplicated: Secondary | ICD-10-CM | POA: Insufficient documentation

## 2013-11-09 HISTORY — DX: Dermatitis, unspecified: L30.9

## 2013-11-09 LAB — WET PREP, GENITAL: Yeast Wet Prep HPF POC: NONE SEEN

## 2013-11-09 LAB — URINALYSIS, ROUTINE W REFLEX MICROSCOPIC
BILIRUBIN URINE: NEGATIVE
GLUCOSE, UA: NEGATIVE mg/dL
HGB URINE DIPSTICK: NEGATIVE
KETONES UR: NEGATIVE mg/dL
Nitrite: NEGATIVE
PROTEIN: NEGATIVE mg/dL
Specific Gravity, Urine: 1.025 (ref 1.005–1.030)
UROBILINOGEN UA: 2 mg/dL — AB (ref 0.0–1.0)
pH: 6.5 (ref 5.0–8.0)

## 2013-11-09 LAB — POCT PREGNANCY, URINE: PREG TEST UR: NEGATIVE

## 2013-11-09 LAB — URINE MICROSCOPIC-ADD ON

## 2013-11-09 MED ORDER — METRONIDAZOLE 500 MG PO TABS
2000.0000 mg | ORAL_TABLET | Freq: Once | ORAL | Status: AC
  Administered 2013-11-09: 2000 mg via ORAL
  Filled 2013-11-09: qty 4

## 2013-11-09 NOTE — Discharge Instructions (Signed)
Trichomoniasis °Trichomoniasis is an infection, caused by the Trichomonas organism, that affects both women and men. In women, the outer female genitalia and the vagina are affected. In men, the penis is mainly affected, but the prostate and other reproductive organs can also be involved. Trichomoniasis is a sexually transmitted disease (STD) and is most often passed to another person through sexual contact. The majority of people who get trichomoniasis do so from a sexual encounter and are also at risk for other STDs. °CAUSES  °· Sexual intercourse with an infected partner. °· It can be present in swimming pools or hot tubs. °SYMPTOMS  °· Abnormal gray-green frothy vaginal discharge in women. °· Vaginal itching and irritation in women. °· Itching and irritation of the area outside the vagina in women. °· Penile discharge with or without pain in males. °· Inflammation of the urethra (urethritis), causing painful urination. °· Bleeding after sexual intercourse. °RELATED COMPLICATIONS °· Pelvic inflammatory disease. °· Infection of the uterus (endometritis). °· Infertility. °· Tubal (ectopic) pregnancy. °· It can be associated with other STDs, including gonorrhea and chlamydia, hepatitis B, and HIV. °COMPLICATIONS DURING PREGNANCY °· Early (premature) delivery. °· Premature rupture of the membranes (PROM). °· Low birth weight. °DIAGNOSIS  °· Visualization of Trichomonas under the microscope from the vagina discharge. °· Ph of the vagina greater than 4.5, tested with a test tape. °· Trich Rapid Test. °· Culture of the organism, but this is not usually needed. °· It may be found on a Pap test. °· Having a "strawberry cervix,"which means the cervix looks very red like a strawberry. °TREATMENT  °· You may be given medication to fight the infection. Inform your caregiver if you could be or are pregnant. Some medications used to treat the infection should not be taken during pregnancy. °· Over-the-counter medications or  creams to decrease itching or irritation may be recommended. °· Your sexual partner will need to be treated if infected. °HOME CARE INSTRUCTIONS  °· Take all medication prescribed by your caregiver. °· Take over-the-counter medication for itching or irritation as directed by your caregiver. °· Do not have sexual intercourse while you have the infection. °· Do not douche or wear tampons. °· Discuss your infection with your partner, as your partner may have acquired the infection from you. Or, your partner may have been the person who transmitted the infection to you. °· Have your sex partner examined and treated if necessary. °· Practice safe, informed, and protected sex. °· See your caregiver for other STD testing. °SEEK MEDICAL CARE IF:  °· You still have symptoms after you finish the medication. °· You have an oral temperature above 102° F (38.9° C). °· You develop belly (abdominal) pain. °· You have pain when you urinate. °· You have bleeding after sexual intercourse. °· You develop a rash. °· The medication makes you sick or makes you throw up (vomit). °Document Released: 04/18/2001 Document Revised: 01/15/2012 Document Reviewed: 05/14/2009 °ExitCare® Patient Information ©2014 [employer]. ° °

## 2013-11-09 NOTE — MAU Provider Note (Signed)
History     CSN: [id number]  Arrival date and time: 11/09/13 [id number]   First Provider Initiated Contact with Patient 11/09/13 1918      No chief complaint on file.  HPI 23 y.o. [id number] with urinary sx x 1 week - frequency, urgency, feeling of incomplete emptying, no dysuria, fever, flank pain. Also reports spotting x 1 month, started her period 2 days ago. + cramping relieved by ibuprofen. Former patient of Dr. Gaynell FaceMarshall, states she will go back to him for GYN care "once I get my medicaid fixed".   Past Medical History  Diagnosis Date  . Chlamydia   . Gonorrhea   . Depression   . Pyelonephritis     X 2  . Eczema     Past Surgical History  Procedure Laterality Date  . No past surgeries      Family History  Problem Relation Age of Onset  . Birth defects Neg Hx   . Diabetes Neg Hx   . Stroke Neg Hx   . Hypertension Neg Hx   . Anesthesia problems Neg Hx   . Hypotension Neg Hx   . Malignant hyperthermia Neg Hx   . Pseudochol deficiency Neg Hx   . Asthma Daughter     History  Substance Use Topics  . Smoking status: Current Every Day Smoker -- 0.50 packs/day    Types: Cigarettes  . Smokeless tobacco: Never Used  . Alcohol Use: Yes     Comment: occas.    Allergies: No Known Allergies  Prescriptions prior to admission  Medication Sig Dispense Refill  . ibuprofen (ADVIL,MOTRIN) 200 MG tablet Take 400 mg by mouth every 6 (six) hours as needed (cramping).        Review of Systems  Constitutional: Negative.   Respiratory: Negative.   Cardiovascular: Negative.   Gastrointestinal: Positive for abdominal pain. Negative for nausea, vomiting, diarrhea and constipation.  Genitourinary: Positive for urgency and frequency. Negative for dysuria, hematuria and flank pain.       + vaginal bleeding  Musculoskeletal: Negative.   Neurological: Negative.   Psychiatric/Behavioral: Negative.    Physical Exam   Blood pressure 102/62, pulse 73, temperature 98.2 F (36.8 C),  temperature source Oral, resp. rate 18, height 5' (1.524 m), weight 122 lb 6 oz (55.509 kg), last menstrual period 11/07/2013, not currently breastfeeding.  Physical Exam  Nursing note and vitals reviewed. Constitutional: She is oriented to person, place, and time. She appears well-developed and well-nourished. No distress.  HENT:  Head: Normocephalic and atraumatic.  Cardiovascular: Normal rate.   Respiratory: Effort normal. No respiratory distress.  GI: Soft. She exhibits no distension and no mass. There is no tenderness. There is no rebound and no guarding.  Genitourinary: There is no rash or lesion on the right labia. There is no rash or lesion on the left labia. Uterus is not deviated, not enlarged, not fixed and not tender. Cervix exhibits no motion tenderness, no discharge and no friability. Right adnexum displays no mass, no tenderness and no fullness. Left adnexum displays no mass, no tenderness and no fullness. There is bleeding (small) around the vagina. No erythema or tenderness around the vagina. No vaginal discharge found.  Neurological: She is alert and oriented to person, place, and time.  Skin: Skin is warm and dry.  Psychiatric: She has a normal mood and affect.    MAU Course  Procedures Results for orders placed during the hospital encounter of 11/09/13 (from the past 24 hour(s))  URINALYSIS, ROUTINE  W REFLEX MICROSCOPIC     Status: Abnormal   Collection Time    11/09/13  7:05 PM      Result Value Range   Color, Urine YELLOW  YELLOW   APPearance CLEAR  CLEAR   Specific Gravity, Urine 1.025  1.005 - 1.030   pH 6.5  5.0 - 8.0   Glucose, UA NEGATIVE  NEGATIVE mg/dL   Hgb urine dipstick NEGATIVE  NEGATIVE   Bilirubin Urine NEGATIVE  NEGATIVE   Ketones, ur NEGATIVE  NEGATIVE mg/dL   Protein, ur NEGATIVE  NEGATIVE mg/dL   Urobilinogen, UA 2.0 (*) 0.0 - 1.0 mg/dL   Nitrite NEGATIVE  NEGATIVE   Leukocytes, UA SMALL (*) NEGATIVE  URINE MICROSCOPIC-ADD ON     Status: None    Collection Time    11/09/13  7:05 PM      Result Value Range   Squamous Epithelial / LPF RARE  RARE   WBC, UA 3-6  <3 WBC/hpf   RBC / HPF 0-2  <3 RBC/hpf   Bacteria, UA RARE  RARE   Urine-Other MUCOUS PRESENT    POCT PREGNANCY, URINE     Status: None   Collection Time    11/09/13  7:07 PM      Result Value Range   Preg Test, Ur NEGATIVE  NEGATIVE  WET PREP, GENITAL     Status: Abnormal   Collection Time    11/09/13  7:15 PM      Result Value Range   Yeast Wet Prep HPF POC NONE SEEN  NONE SEEN   Trich, Wet Prep FEW (*) NONE SEEN   Clue Cells Wet Prep HPF POC FEW (*) NONE SEEN   WBC, Wet Prep HPF POC FEW (*) NONE SEEN     Assessment and Plan   1. Trichomonal vaginitis   Treated in MAU with Flagyl, no IC x 1 week, F/U with worsening sx    Medication List         ibuprofen 200 MG tablet  Commonly known as:  ADVIL,MOTRIN  Take 400 mg by mouth every 6 (six) hours as needed (cramping).        Follow-up Information   Follow up with Kathreen Cosier, MD. (As needed)    Specialty:  Obstetrics and Gynecology   Contact information:   [address] [place] [address] [phone]         Ahnna Dungan 11/09/2013, 8:08 PM

## 2013-11-09 NOTE — MAU Note (Signed)
Pt states may have uti, feels uncomfortable after voiding, like she still has to go more, has felt discomfort x1 month. Denies burning. Has been spotting daily for past 2 weeks. LMP-11/07/2013

## 2013-11-10 LAB — GC/CHLAMYDIA PROBE AMP
CT PROBE, AMP APTIMA: NEGATIVE
GC Probe RNA: NEGATIVE

## 2013-11-10 NOTE — MAU Provider Note (Signed)
Attestation of Attending Supervision of Advanced Practitioner (CNM/NP): Evaluation and management procedures were performed by the Advanced Practitioner under my supervision and collaboration. I have reviewed the Advanced Practitioner's note and chart, and I agree with the management and plan.  Julieanne Hadsall H. 3:10 AM

## 2013-12-31 ENCOUNTER — Inpatient Hospital Stay (HOSPITAL_COMMUNITY)
Admission: AD | Admit: 2013-12-31 | Discharge: 2013-12-31 | Disposition: A | Discharge status: Home / Self Care | Payer: Self-pay | Source: Ambulatory Visit | Attending: Family Medicine | Admitting: Family Medicine

## 2013-12-31 ENCOUNTER — Encounter (HOSPITAL_COMMUNITY): Payer: Self-pay | Admitting: *Deleted

## 2013-12-31 DIAGNOSIS — M545 Low back pain, unspecified: Secondary | ICD-10-CM | POA: Insufficient documentation

## 2013-12-31 DIAGNOSIS — R109 Unspecified abdominal pain: Secondary | ICD-10-CM | POA: Insufficient documentation

## 2013-12-31 DIAGNOSIS — R350 Frequency of micturition: Secondary | ICD-10-CM | POA: Insufficient documentation

## 2013-12-31 DIAGNOSIS — N390 Urinary tract infection, site not specified: Secondary | ICD-10-CM

## 2013-12-31 DIAGNOSIS — F17200 Nicotine dependence, unspecified, uncomplicated: Secondary | ICD-10-CM | POA: Insufficient documentation

## 2013-12-31 LAB — URINE MICROSCOPIC-ADD ON

## 2013-12-31 LAB — WET PREP, GENITAL
Trich, Wet Prep: NONE SEEN
Yeast Wet Prep HPF POC: NONE SEEN

## 2013-12-31 LAB — URINALYSIS, ROUTINE W REFLEX MICROSCOPIC
Bilirubin Urine: NEGATIVE
GLUCOSE, UA: NEGATIVE mg/dL
KETONES UR: NEGATIVE mg/dL
Nitrite: POSITIVE — AB
PROTEIN: NEGATIVE mg/dL
Specific Gravity, Urine: 1.025 (ref 1.005–1.030)
UROBILINOGEN UA: 2 mg/dL — AB (ref 0.0–1.0)
pH: 6 (ref 5.0–8.0)

## 2013-12-31 LAB — GC/CHLAMYDIA PROBE AMP
CT Probe RNA: NEGATIVE
GC PROBE AMP APTIMA: NEGATIVE

## 2013-12-31 LAB — POCT PREGNANCY, URINE: PREG TEST UR: NEGATIVE

## 2013-12-31 MED ORDER — SULFAMETHOXAZOLE-TRIMETHOPRIM 800-160 MG PO TABS
1.0000 | ORAL_TABLET | Freq: Two times a day (BID) | ORAL | Status: AC
Start: 2013-12-31 — End: 2014-01-07

## 2013-12-31 MED ORDER — SULFAMETHOXAZOLE-TRIMETHOPRIM 800-160 MG PO TABS: 1.0000 | ORAL_TABLET | Freq: Two times a day (BID) | ORAL | Status: DC

## 2013-12-31 NOTE — MAU Provider Note (Signed)
Attestation of Attending Supervision of Advanced Practitioner (PA/CNM/NP): Evaluation and management procedures were performed by the Advanced Practitioner under my supervision and collaboration.  I have reviewed the Advanced Practitioner's note and chart, and I agree with the management and plan.  PRATT,TANYA S, MD [facility] Attending 12/31/2013 6:48 AM   

## 2013-12-31 NOTE — Discharge Instructions (Signed)
Urinary Tract Infection °A urinary tract infection (UTI) can occur any place along the urinary tract. The tract includes the kidneys, ureters, bladder, and urethra. A type of germ called bacteria often causes a UTI. UTIs are often helped with antibiotic medicine.  °HOME CARE  °· If given, take antibiotics as told by your doctor. Finish them even if you start to feel better. °· Drink enough fluids to keep your pee (urine) clear or pale yellow. °· Avoid tea, drinks with caffeine, and bubbly (carbonated) drinks. °· Pee often. Avoid holding your pee in for a long time. °· Pee before and after having sex (intercourse). °· Wipe from front to back after you poop (bowel movement) if you are a woman. Use each tissue only once. °GET HELP RIGHT AWAY IF:  °· You have back pain. °· You have lower belly (abdominal) pain. °· You have chills. °· You feel sick to your stomach (nauseous). °· You throw up (vomit). °· Your burning or discomfort with peeing does not go away. °· You have a fever. °· Your symptoms are not better in 3 days. °MAKE SURE YOU:  °· Understand these instructions. °· Will watch your condition. °· Will get help right away if you are not doing well or get worse. °Document Released: 04/10/2008 Document Revised: 07/17/2012 Document Reviewed: 05/23/2012 °ExitCare® Patient Information ©2014 [employer]. ° °

## 2013-12-31 NOTE — MAU Provider Note (Signed)
History     CSN: [id number]  Arrival date and time: 12/31/13 [id number]   First Provider Initiated Contact with Patient 12/31/13 0106      Chief Complaint  Patient presents with  . Abdominal Pain  . Vaginal Bleeding   HPI [name] is a 23 y.o. [phone] who presents to MAU today with complaint of lower abdominal pain x 3 days. The patient also endorses low back pain and spotting that started today. She patient denies vaginal discharge, fever, flank pain or N/V/D or constipation. She does state that her urine has been dark and had a foul odor recently and she has also had frequency of urination. She denies dysuria.   OB History   Grav Para Term Preterm Abortions TAB SAB Ect Mult Living   2 2 2       2       Past Medical History  Diagnosis Date  . Chlamydia   . Gonorrhea   . Depression   . Pyelonephritis     X 2  . Eczema     Past Surgical History  Procedure Laterality Date  . No past surgeries      Family History  Problem Relation Age of Onset  . Birth defects Neg Hx   . Diabetes Neg Hx   . Stroke Neg Hx   . Hypertension Neg Hx   . Anesthesia problems Neg Hx   . Hypotension Neg Hx   . Malignant hyperthermia Neg Hx   . Pseudochol deficiency Neg Hx   . Asthma Daughter     History  Substance Use Topics  . Smoking status: Current Every Day Smoker -- 0.50 packs/day    Types: Cigarettes  . Smokeless tobacco: Never Used  . Alcohol Use: Yes     Comment: occas.    Allergies: No Known Allergies  Prescriptions prior to admission  Medication Sig Dispense Refill  . ibuprofen (ADVIL,MOTRIN) 200 MG tablet Take 400 mg by mouth every 6 (six) hours as needed (cramping).        Review of Systems  Constitutional: Negative for fever and malaise/fatigue.  Gastrointestinal: Positive for abdominal pain. Negative for nausea, vomiting, diarrhea and constipation.  Genitourinary: Positive for frequency. Negative for dysuria, urgency, hematuria and flank pain.       +  vaginal bleeding Neg - vaginal discharge   Physical Exam   Blood pressure 112/65, pulse 80, temperature 98.8 F (37.1 C), temperature source Oral, resp. rate 16, last menstrual period 12/07/2013, not currently breastfeeding.  Physical Exam  Constitutional: She is oriented to person, place, and time. She appears well-developed and well-nourished. No distress.  HENT:  Head: Normocephalic and atraumatic.  Cardiovascular: Normal rate.   Respiratory: Effort normal.  GI: Soft. Bowel sounds are normal. She exhibits no distension and no mass. There is tenderness (mild tenderness to palpation of the suprapubic region at midline). There is no rebound and no guarding.  Genitourinary: Uterus is tender (mild). Uterus is not enlarged. Cervix exhibits no motion tenderness, no discharge and no friability. Right adnexum displays no mass and no tenderness. Left adnexum displays no mass and no tenderness. There is bleeding (scant blood noted in the vaginal vault) around the vagina. No vaginal discharge found.  Neurological: She is alert and oriented to person, place, and time.  Skin: Skin is warm and dry. No erythema.  Psychiatric: She has a normal mood and affect.   Results for orders placed during the hospital encounter of 12/31/13 (from the past 24  hour(s))  URINALYSIS, ROUTINE W REFLEX MICROSCOPIC     Status: Abnormal   Collection Time    12/31/13 12:37 AM      Result Value Ref Range   Color, Urine YELLOW  YELLOW   APPearance CLEAR  CLEAR   Specific Gravity, Urine 1.025  1.005 - 1.030   pH 6.0  5.0 - 8.0   Glucose, UA NEGATIVE  NEGATIVE mg/dL   Hgb urine dipstick MODERATE (*) NEGATIVE   Bilirubin Urine NEGATIVE  NEGATIVE   Ketones, ur NEGATIVE  NEGATIVE mg/dL   Protein, ur NEGATIVE  NEGATIVE mg/dL   Urobilinogen, UA 2.0 (*) 0.0 - 1.0 mg/dL   Nitrite POSITIVE (*) NEGATIVE   Leukocytes, UA MODERATE (*) NEGATIVE  URINE MICROSCOPIC-ADD ON     Status: Abnormal   Collection Time    12/31/13 12:37  AM      Result Value Ref Range   Squamous Epithelial / LPF FEW (*) RARE   WBC, UA 11-20  <3 WBC/hpf   RBC / HPF 11-20  <3 RBC/hpf   Bacteria, UA MANY (*) RARE  POCT PREGNANCY, URINE     Status: None   Collection Time    12/31/13 12:58 AM      Result Value Ref Range   Preg Test, Ur NEGATIVE  NEGATIVE  WET PREP, GENITAL     Status: Abnormal   Collection Time    12/31/13  1:10 AM      Result Value Ref Range   Yeast Wet Prep HPF POC NONE SEEN  NONE SEEN   Trich, Wet Prep NONE SEEN  NONE SEEN   Clue Cells Wet Prep HPF POC FEW (*) NONE SEEN   WBC, Wet Prep HPF POC MODERATE (*) NONE SEEN    MAU Course  Procedures None  MDM UPT - negative UA, Wet prep, GC/Chlamydia today  Assessment and Plan  A: UTI  Menses  P: Discharge home Rx for Bactrim given to patient Recommended increased PO hydration as tolerated Warning signs for pyelonephritis discussed Patient may return to MAU as needed or if her condition were to change or worsen  Freddi Starr, PA-C  12/31/2013, 1:32 AM

## 2013-12-31 NOTE — MAU Note (Signed)
Pt states her last menstrual period was" the beginning of February the end of January, I really don't remember"

## 2014-02-26 ENCOUNTER — Inpatient Hospital Stay (HOSPITAL_COMMUNITY)

## 2014-02-26 ENCOUNTER — Encounter (HOSPITAL_COMMUNITY): Payer: Self-pay | Admitting: *Deleted

## 2014-02-26 ENCOUNTER — Inpatient Hospital Stay (HOSPITAL_COMMUNITY)
Admission: AD | Admit: 2014-02-26 | Discharge: 2014-02-26 | Disposition: A | Discharge status: Home / Self Care | Payer: Self-pay | Source: Ambulatory Visit | Attending: Obstetrics and Gynecology | Admitting: Obstetrics and Gynecology

## 2014-02-26 DIAGNOSIS — N760 Acute vaginitis: Secondary | ICD-10-CM | POA: Insufficient documentation

## 2014-02-26 DIAGNOSIS — R1032 Left lower quadrant pain: Secondary | ICD-10-CM | POA: Insufficient documentation

## 2014-02-26 DIAGNOSIS — R300 Dysuria: Secondary | ICD-10-CM | POA: Insufficient documentation

## 2014-02-26 DIAGNOSIS — F17200 Nicotine dependence, unspecified, uncomplicated: Secondary | ICD-10-CM | POA: Insufficient documentation

## 2014-02-26 DIAGNOSIS — B9689 Other specified bacterial agents as the cause of diseases classified elsewhere: Secondary | ICD-10-CM | POA: Insufficient documentation

## 2014-02-26 DIAGNOSIS — A499 Bacterial infection, unspecified: Secondary | ICD-10-CM | POA: Insufficient documentation

## 2014-02-26 DIAGNOSIS — N12 Tubulo-interstitial nephritis, not specified as acute or chronic: Secondary | ICD-10-CM | POA: Insufficient documentation

## 2014-02-26 DIAGNOSIS — IMO0001 Reserved for inherently not codable concepts without codable children: Secondary | ICD-10-CM

## 2014-02-26 LAB — URINALYSIS, ROUTINE W REFLEX MICROSCOPIC
BILIRUBIN URINE: NEGATIVE
Glucose, UA: NEGATIVE mg/dL
KETONES UR: 15 mg/dL — AB
Nitrite: POSITIVE — AB
PH: 5 (ref 5.0–8.0)
PROTEIN: 30 mg/dL — AB
Specific Gravity, Urine: 1.025 (ref 1.005–1.030)
Urobilinogen, UA: 0.2 mg/dL (ref 0.0–1.0)

## 2014-02-26 LAB — CBC WITH DIFFERENTIAL/PLATELET
Basophils Absolute: 0 10*3/uL (ref 0.0–0.1)
Basophils Relative: 0 % (ref 0–1)
EOS PCT: 0 % (ref 0–5)
Eosinophils Absolute: 0 10*3/uL (ref 0.0–0.7)
HEMATOCRIT: 37.3 % (ref 36.0–46.0)
Hemoglobin: 12.6 g/dL (ref 12.0–15.0)
Lymphocytes Relative: 5 % — ABNORMAL LOW (ref 12–46)
Lymphs Abs: 1 10*3/uL (ref 0.7–4.0)
MCH: 29 pg (ref 26.0–34.0)
MCHC: 33.8 g/dL (ref 30.0–36.0)
MCV: 85.7 fL (ref 78.0–100.0)
MONOS PCT: 5 % (ref 3–12)
Monocytes Absolute: 1 10*3/uL (ref 0.1–1.0)
Neutro Abs: 16.8 10*3/uL — ABNORMAL HIGH (ref 1.7–7.7)
Neutrophils Relative %: 89 % — ABNORMAL HIGH (ref 43–77)
Platelets: 188 10*3/uL (ref 150–400)
RBC: 4.35 MIL/uL (ref 3.87–5.11)
RDW: 13.6 % (ref 11.5–15.5)
WBC: 18.8 10*3/uL — ABNORMAL HIGH (ref 4.0–10.5)

## 2014-02-26 LAB — URINE MICROSCOPIC-ADD ON

## 2014-02-26 LAB — WET PREP, GENITAL
TRICH WET PREP: NONE SEEN
YEAST WET PREP: NONE SEEN

## 2014-02-26 LAB — GC/CHLAMYDIA PROBE AMP
CT PROBE, AMP APTIMA: NEGATIVE
GC Probe RNA: NEGATIVE

## 2014-02-26 LAB — POCT PREGNANCY, URINE: Preg Test, Ur: NEGATIVE

## 2014-02-26 MED ORDER — PHENAZOPYRIDINE HCL 100 MG PO TABS
200.0000 mg | ORAL_TABLET | Freq: Once | ORAL | Status: AC
  Administered 2014-02-26: 200 mg via ORAL
  Filled 2014-02-26: qty 2

## 2014-02-26 MED ORDER — OXYCODONE-ACETAMINOPHEN 5-325 MG PO TABS
2.0000 | ORAL_TABLET | Freq: Once | ORAL | Status: AC
  Administered 2014-02-26: 2 via ORAL
  Filled 2014-02-26: qty 2

## 2014-02-26 MED ORDER — IBUPROFEN 600 MG PO TABS: 600.0000 mg | ORAL_TABLET | Freq: Four times a day (QID) | ORAL | Status: DC | PRN

## 2014-02-26 MED ORDER — OXYCODONE-ACETAMINOPHEN 5-325 MG PO TABS: 1.0000 | ORAL_TABLET | Freq: Once | ORAL | Status: DC

## 2014-02-26 MED ORDER — ONDANSETRON 8 MG PO TBDP
8.0000 mg | ORAL_TABLET | Freq: Once | ORAL | Status: AC
  Administered 2014-02-26: 8 mg via ORAL
  Filled 2014-02-26: qty 1

## 2014-02-26 MED ORDER — KETOROLAC TROMETHAMINE 60 MG/2ML IM SOLN
60.0000 mg | Freq: Once | INTRAMUSCULAR | Status: AC
  Administered 2014-02-26: 60 mg via INTRAMUSCULAR
  Filled 2014-02-26: qty 2

## 2014-02-26 MED ORDER — PROMETHAZINE HCL 25 MG PO TABS: 25.0000 mg | ORAL_TABLET | Freq: Four times a day (QID) | ORAL | Status: DC | PRN

## 2014-02-26 MED ORDER — CIPROFLOXACIN HCL 500 MG PO TABS
500.0000 mg | ORAL_TABLET | Freq: Once | ORAL | Status: AC
  Administered 2014-02-26: 500 mg via ORAL
  Filled 2014-02-26: qty 1

## 2014-02-26 MED ORDER — CIPROFLOXACIN HCL 500 MG PO TABS: 500.0000 mg | ORAL_TABLET | Freq: Two times a day (BID) | ORAL | Status: DC

## 2014-02-26 MED ORDER — CEFTRIAXONE SODIUM 1 G IJ SOLR
1.0000 g | Freq: Once | INTRAMUSCULAR | Status: DC
  Filled 2014-02-26: qty 10

## 2014-02-26 MED ORDER — METRONIDAZOLE 500 MG PO TABS: 500.0000 mg | ORAL_TABLET | Freq: Two times a day (BID) | ORAL | Status: DC

## 2014-02-26 MED ORDER — PHENAZOPYRIDINE HCL 200 MG PO TABS: 200.0000 mg | ORAL_TABLET | Freq: Three times a day (TID) | ORAL | Status: DC | PRN

## 2014-02-26 NOTE — MAU Note (Signed)
Pt LMP 01/31/2014, sharp left side pain that comes and goes x 2 days, headache all day today, vag bleeding when using the bathroom.

## 2014-02-26 NOTE — Discharge Instructions (Signed)
Pyelonephritis, Adult Pyelonephritis is a kidney infection. In general, there are 2 main types of pyelonephritis:  Infections that come on quickly without any warning (acute pyelonephritis).  Infections that persist for a long period of time (chronic pyelonephritis). CAUSES  Two main causes of pyelonephritis are:  Bacteria traveling from the bladder to the kidney. This is a problem especially in pregnant women. The urine in the bladder can become filled with bacteria from multiple causes, including:  Inflammation of the prostate gland (prostatitis).  Sexual intercourse in females.  Bladder infection (cystitis).  Bacteria traveling from the bloodstream to the tissue part of the kidney. Problems that may increase your risk of getting a kidney infection include:  Diabetes.  Kidney stones or bladder stones.  Cancer.  Catheters placed in the bladder.  Other abnormalities of the kidney or ureter. SYMPTOMS   Abdominal pain.  Pain in the side or flank area.  Fever.  Chills.  Upset stomach.  Blood in the urine (dark urine).  Frequent urination.  Strong or persistent urge to urinate.  Burning or stinging when urinating. DIAGNOSIS  Your caregiver may diagnose your kidney infection based on your symptoms. A urine sample may also be taken. TREATMENT  In general, treatment depends on how severe the infection is.   If the infection is mild and caught early, your caregiver may treat you with oral antibiotics and send you home.  If the infection is more severe, the bacteria may have gotten into the bloodstream. This will require intravenous (IV) antibiotics and a hospital stay. Symptoms may include:  High fever.  Severe flank pain.  Shaking chills.  Even after a hospital stay, your caregiver may require you to be on oral antibiotics for a period of time.  Other treatments may be required depending upon the cause of the infection. HOME CARE INSTRUCTIONS   Take your  antibiotics as directed. Finish them even if you start to feel better.  Make an appointment to have your urine checked to make sure the infection is gone.  Drink enough fluids to keep your urine clear or pale yellow.  Take medicines for the bladder if you have urgency and frequency of urination as directed by your caregiver. SEEK IMMEDIATE MEDICAL CARE IF:   You have a fever or persistent symptoms for more than 2-3 days.  You have a fever and your symptoms suddenly get worse.  You are unable to take your antibiotics or fluids.  You develop shaking chills.  You experience extreme weakness or fainting.  There is no improvement after 2 days of treatment. MAKE SURE YOU:  Understand these instructions.  Will watch your condition.  Will get help right away if you are not doing well or get worse. Document Released: 10/23/2005 Document Revised: 04/23/2012 Document Reviewed: 03/29/2011 [facility] Patient Information 2014 [place], [place].   Bacterial Vaginosis Bacterial vaginosis is a vaginal infection that occurs when the normal balance of bacteria in the vagina is disrupted. It results from an overgrowth of certain bacteria. This is the most common vaginal infection in women of childbearing age. Treatment is important to prevent complications, especially in pregnant women, as it can cause a premature delivery. CAUSES  Bacterial vaginosis is caused by an increase in harmful bacteria that are normally present in smaller amounts in the vagina. Several different kinds of bacteria can cause bacterial vaginosis. However, the reason that the condition develops is not fully understood. RISK FACTORS Certain activities or behaviors can put you at an increased risk of developing  bacterial vaginosis, including:  Having a new sex partner or multiple sex partners.  Douching.  Using an intrauterine device (IUD) for contraception. Women do not get bacterial vaginosis from toilet seats, bedding,  swimming pools, or contact with objects around them. SIGNS AND SYMPTOMS  Some women with bacterial vaginosis have no signs or symptoms. Common symptoms include:  Grey vaginal discharge.  A fishlike odor with discharge, especially after sexual intercourse.  Itching or burning of the vagina and vulva.  Burning or pain with urination. DIAGNOSIS  Your health care provider will take a medical history and examine the vagina for signs of bacterial vaginosis. A sample of vaginal fluid may be taken. Your health care provider will look at this sample under a microscope to check for bacteria and abnormal cells. A vaginal pH test may also be done.  TREATMENT  Bacterial vaginosis may be treated with antibiotic medicines. These may be given in the form of a pill or a vaginal cream. A second round of antibiotics may be prescribed if the condition comes back after treatment.  HOME CARE INSTRUCTIONS   Only take over-the-counter or prescription medicines as directed by your health care provider.  If antibiotic medicine was prescribed, take it as directed. Make sure you finish it even if you start to feel better.  Do not have sex until treatment is completed.  Tell all sexual partners that you have a vaginal infection. They should see their health care provider and be treated if they have problems, such as a mild rash or itching.  Practice safe sex by using condoms and only having one sex partner. SEEK MEDICAL CARE IF:   Your symptoms are not improving after 3 days of treatment.  You have increased discharge or pain.  You have a fever. MAKE SURE YOU:   Understand these instructions.  Will watch your condition.  Will get help right away if you are not doing well or get worse. FOR MORE INFORMATION  Centers for Disease Control and Prevention, Division of STD Prevention: SolutionApps.co.zawww.cdc.gov/std American Sexual Health Association (ASHA): www.ashastd.org  Document Released: 10/23/2005 Document Revised:  08/13/2013 Document Reviewed: 06/04/2013 [facility]ExitCare Patient Information 2014 [place], [place].

## 2014-02-26 NOTE — MAU Provider Note (Signed)
Chief Complaint: Vaginal Bleeding, Headache and Abdominal Pain   First Provider Initiated Contact with Patient 02/26/14 0334     SUBJECTIVE HPI: Theresa [name] is a 23 y.o. [id number] female P who presents with LLQ and left side pain, dysuria, urgency , ?hematuria vs VB, HA x 2 days. Thinks she may have a kidney infection. Hx of multiple UTIs and Pyelo. Patient's last menstrual period was 01/31/2014.  Describes abd pain as intermittent, 10/10 at worst, no relationship to mvmt. Has been taking Ibuprofen 800 mg tabs w./ good relief, but pain returns. Describes HA as generalized, dull, 10/10, but relieved by ibuprofen.   Does not have PCP. Comes to MAU for UTIs and Pyelo.  Past Medical History  Diagnosis Date  . Chlamydia   . Gonorrhea   . Depression   . Pyelonephritis     X 2  . Eczema         UTI   OB History  Gravida Para Term Preterm AB SAB TAB Ectopic Multiple Living  2 2 2       2     # Outcome Date GA Lbr Len/2nd Weight Sex Delivery Anes PTL Lv  2 TRM 02/20/12 [redacted]w[redacted]d 16:37 / 01:09 3.335 kg (7 lb 5.6 oz) M SVD EPI  Y  1 TRM 09/28/09 [redacted]w[redacted]d  3.175 kg (7 lb) F SVD EPI N Y     Past Surgical History  Procedure Laterality Date  . No past surgeries     History   Social History  . Marital Status: Single    Spouse Name: N/A    Number of Children: N/A  . Years of Education: N/A   Occupational History  . Not on file.   Social History Main Topics  . Smoking status: Current Every Day Smoker -- 0.50 packs/day    Types: Cigarettes  . Smokeless tobacco: Never Used  . Alcohol Use: Yes     Comment: occas.  . Drug Use: Yes    Special: Marijuana     Comment: last use one week ago  . Sexual Activity: Yes    Birth Control/ Protection: None   Other Topics Concern  . Not on file   Social History Narrative  . No narrative on file   No current facility-administered medications on file prior to encounter.   No current outpatient prescriptions on file prior to encounter.   No  Known Allergies  ROS: Pertinent items in HPI. Mild nausea. Has not taken temp. Neg for chills, vomiting, frequency, vaginal discharge, intermenstrual bleeding, URI Sx, cough, sore throat.  OBJECTIVE Blood pressure 102/67, pulse 110, temperature 98.8 F (37.1 C), temperature source Oral, resp. rate 16, height 5' (1.524 m), weight 55.067 kg (121 lb 6.4 oz), last menstrual period 01/31/2014. Patient Vitals for the past 24 hrs:  BP Temp Temp src Pulse Resp Height Weight  02/26/14 0357 - 98.8 F (37.1 C) Oral - - - -  02/26/14 0221 102/67 mmHg 99.6 F (37.6 C) Oral 110 16 5' (1.524 m) 55.067 kg (121 lb 6.4 oz)    GENERAL: Well-developed, well-nourished female in mild distress. Shaking w/ chills.  HEENT: Normocephalic. No congestion. HEART: tachycardiac RESP: normal effort ABDOMEN: Soft, non-tender. Pos BS. ? Left flank pain (lower than normal) EXTREMITIES: Nontender, no edema NEURO: Alert and oriented SPECULUM EXAM: NEFG, moderate amount of bright red blood noted coming from os, cervix clean, but incompletely visualized. No mucopurulent discharge BIMANUAL: cervix closed; uterus normal size, Pos left adnexal tenderness. No mass. No right  adnexal tenderness or mass. Possible CMT vs bladder tenderness.   LAB RESULTS Results for orders placed during the hospital encounter of 02/26/14 (from the past 24 hour(s))  URINALYSIS, ROUTINE W REFLEX MICROSCOPIC     Status: Abnormal   Collection Time    02/26/14  2:25 AM      Result Value Ref Range   Color, Urine YELLOW  YELLOW   APPearance CLOUDY (*) CLEAR   Specific Gravity, Urine 1.025  1.005 - 1.030   pH 5.0  5.0 - 8.0   Glucose, UA NEGATIVE  NEGATIVE mg/dL   Hgb urine dipstick MODERATE (*) NEGATIVE   Bilirubin Urine NEGATIVE  NEGATIVE   Ketones, ur 15 (*) NEGATIVE mg/dL   Protein, ur 30 (*) NEGATIVE mg/dL   Urobilinogen, UA 0.2  0.0 - 1.0 mg/dL   Nitrite POSITIVE (*) NEGATIVE   Leukocytes, UA LARGE (*) NEGATIVE  URINE MICROSCOPIC-ADD ON      Status: Abnormal   Collection Time    02/26/14  2:25 AM      Result Value Ref Range   Squamous Epithelial / LPF RARE  RARE   WBC, UA TOO NUMEROUS TO COUNT  <3 WBC/hpf   RBC / HPF 3-6  <3 RBC/hpf   Bacteria, UA MANY (*) RARE  POCT PREGNANCY, URINE     Status: None   Collection Time    02/26/14  2:48 AM      Result Value Ref Range   Preg Test, Ur NEGATIVE  NEGATIVE  CBC WITH DIFFERENTIAL     Status: Abnormal   Collection Time    02/26/14  3:00 AM      Result Value Ref Range   WBC 18.8 (*) 4.0 - 10.5 K/uL   RBC 4.35  3.87 - 5.11 MIL/uL   Hemoglobin 12.6  12.0 - 15.0 g/dL   HCT 78.237.3  95.636.0 - 21.346.0 %   MCV 85.7  78.0 - 100.0 fL   MCH 29.0  26.0 - 34.0 pg   MCHC 33.8  30.0 - 36.0 g/dL   RDW 08.613.6  57.811.5 - 46.915.5 %   Platelets 188  150 - 400 K/uL   Neutrophils Relative % 89 (*) 43 - 77 %   Neutro Abs 16.8 (*) 1.7 - 7.7 K/uL   Lymphocytes Relative 5 (*) 12 - 46 %   Lymphs Abs 1.0  0.7 - 4.0 K/uL   Monocytes Relative 5  3 - 12 %   Monocytes Absolute 1.0  0.1 - 1.0 K/uL   Eosinophils Relative 0  0 - 5 %   Eosinophils Absolute 0.0  0.0 - 0.7 K/uL   Basophils Relative 0  0 - 1 %   Basophils Absolute 0.0  0.0 - 0.1 K/uL  WET PREP, GENITAL     Status: Abnormal   Collection Time    02/26/14  3:54 AM      Result Value Ref Range   Yeast Wet Prep HPF POC NONE SEEN  NONE SEEN   Trich, Wet Prep NONE SEEN  NONE SEEN   Clue Cells Wet Prep HPF POC MODERATE (*) NONE SEEN   WBC, Wet Prep HPF POC FEW (*) NONE SEEN    IMAGING [place] Transvaginal Non-ob  02/26/2014   CLINICAL DATA:  Left lower quadrant pain, leukocytosis, low grade temperature.  EXAM: TRANSABDOMINAL AND TRANSVAGINAL ULTRASOUND OF PELVIS  TECHNIQUE: Both transabdominal and transvaginal ultrasound examinations of the pelvis were performed. Transabdominal technique was performed for global imaging of the pelvis including uterus,  ovaries, adnexal regions, and pelvic cul-de-sac. It was necessary to proceed with endovaginal exam following the  transabdominal exam to visualize the ovaries and endometrium.  COMPARISON:  None  FINDINGS: Uterus  Measurements: 8.6 x 3.5 x 4.4 cm, anteverted. No fibroids or other mass visualized.  Endometrium  Thickness: 5.7 mm.  No focal abnormality visualized.  Right ovary  Measurements: 2.7 x 1.8 x 1.6 cm. Normal appearance/no adnexal mass.  Left ovary  Measurements: 2.8 x 2.1 x 2.5 cm. Present corpus luteum cyst present. Normal appearance/no adnexal mass.  Other findings  No free fluid.  IMPRESSION: Normal ultrasound appearance of the uterus and ovaries.   Electronically Signed   By: Burman NievesWilliam  Stevens M.D.   On: 02/26/2014 04:39   [place] Pelvis Complete  02/26/2014   CLINICAL DATA:  Left lower quadrant pain, leukocytosis, low grade temperature.  EXAM: TRANSABDOMINAL AND TRANSVAGINAL ULTRASOUND OF PELVIS  TECHNIQUE: Both transabdominal and transvaginal ultrasound examinations of the pelvis were performed. Transabdominal technique was performed for global imaging of the pelvis including uterus, ovaries, adnexal regions, and pelvic cul-de-sac. It was necessary to proceed with endovaginal exam following the transabdominal exam to visualize the ovaries and endometrium.  COMPARISON:  None  FINDINGS: Uterus  Measurements: 8.6 x 3.5 x 4.4 cm, anteverted. No fibroids or other mass visualized.  Endometrium  Thickness: 5.7 mm.  No focal abnormality visualized.  Right ovary  Measurements: 2.7 x 1.8 x 1.6 cm. Normal appearance/no adnexal mass.  Left ovary  Measurements: 2.8 x 2.1 x 2.5 cm. Present corpus luteum cyst present. Normal appearance/no adnexal mass.  Other findings  No free fluid.  IMPRESSION: Normal ultrasound appearance of the uterus and ovaries.   Electronically Signed   By: Burman NievesWilliam  Stevens M.D.   On: 02/26/2014 04:39    MAU COURSE IM Rocephin, Zofran.   Refuses Rocephin or any IV or IM medications. Explained to patient my concern that she is bordering on needing IV antibiotics for treatment of pyelonephritis as it is  and that her best chance of being able to have successful outpatient treatment with the to start with an injection of Rocephin. Patient verbalizes understanding, but still refuses. Patient also states that she does not get any pain relief from Toradol. Requests pain medication. Cipro and Percocet ordered.  ASSESSMENT 1. Recurrent pyelonephritis   2. BV (bacterial vaginosis)   3. Onset of menses     PLAN Discharge home in stable condition. Take all medications as directed and fluids. Urine culture, GC/chlamydia cultures pending.     Follow-up Information   Follow up with [facility]. (As needed if symptoms worsen or if you're unable to keep her medications down due to nausea and vomiting.)    Contact information:   [address] [place] [id number]       Follow up with [facility]. (for primary care and further evaluation of recurrent UTIs and kidney infections)    Contact information:   [address] [place] [address] [phone]       Medication List         ciprofloxacin 500 MG tablet  Commonly known as:  CIPRO  Take 1 tablet (500 mg total) by mouth 2 (two) times daily.     ibuprofen 600 MG tablet  Commonly known as:  ADVIL,MOTRIN  Take 1 tablet (600 mg total) by mouth every 6 (six) hours as needed for moderate pain (cramping).     metroNIDAZOLE 500 MG tablet  Commonly known as:  FLAGYL  Take 1 tablet (500 mg total) by mouth 2 (two) times daily.     oxyCODONE-acetaminophen 5-325 MG per tablet  Commonly known as:  PERCOCET/ROXICET  Take 1-2 tablets by mouth once.     phenazopyridine 200 MG tablet  Commonly known as:  PYRIDIUM  Take 1 tablet (200 mg total) by mouth 3 (three) times daily as needed for pain.     promethazine 25 MG tablet  Commonly known as:  PHENERGAN  Take 1 tablet (25 mg total) by mouth every 6 (six) hours as needed for nausea or vomiting.         Bradley, CNM 02/26/2014  5:42 AM

## 2014-03-08 NOTE — MAU Provider Note (Signed)
Attestation of Attending Supervision of Advanced Practitioner: Evaluation and management procedures were performed by the PA/NP/CNM/OB Fellow under my supervision/collaboration. Chart reviewed and agree with management and plan.  Tilda BurrowJohn V Kinda Pottle 03/08/2014 7:10 AM

## 2014-04-06 NOTE — Clinical Documentation Improvement (Signed)
Chart accessed for purpose of chart audit in reference to [place] [employer].  

## 2014-05-10 ENCOUNTER — Inpatient Hospital Stay (HOSPITAL_COMMUNITY)
Admission: AD | Admit: 2014-05-10 | Discharge: 2014-05-10 | Disposition: A | Discharge status: Home / Self Care | Payer: Medicaid Other | Source: Ambulatory Visit | Attending: Obstetrics and Gynecology | Admitting: Obstetrics and Gynecology

## 2014-05-10 ENCOUNTER — Encounter (HOSPITAL_COMMUNITY): Payer: Self-pay | Admitting: *Deleted

## 2014-05-10 DIAGNOSIS — O26859 Spotting complicating pregnancy, unspecified trimester: Secondary | ICD-10-CM | POA: Insufficient documentation

## 2014-05-10 DIAGNOSIS — N760 Acute vaginitis: Secondary | ICD-10-CM | POA: Insufficient documentation

## 2014-05-10 DIAGNOSIS — R109 Unspecified abdominal pain: Secondary | ICD-10-CM | POA: Insufficient documentation

## 2014-05-10 DIAGNOSIS — A499 Bacterial infection, unspecified: Secondary | ICD-10-CM | POA: Insufficient documentation

## 2014-05-10 DIAGNOSIS — O99330 Smoking (tobacco) complicating pregnancy, unspecified trimester: Secondary | ICD-10-CM | POA: Insufficient documentation

## 2014-05-10 DIAGNOSIS — B9689 Other specified bacterial agents as the cause of diseases classified elsewhere: Secondary | ICD-10-CM | POA: Insufficient documentation

## 2014-05-10 DIAGNOSIS — O2390 Unspecified genitourinary tract infection in pregnancy, unspecified trimester: Secondary | ICD-10-CM | POA: Insufficient documentation

## 2014-05-10 LAB — URINALYSIS, ROUTINE W REFLEX MICROSCOPIC
Bilirubin Urine: NEGATIVE
Glucose, UA: NEGATIVE mg/dL
Hgb urine dipstick: NEGATIVE
KETONES UR: 15 mg/dL — AB
NITRITE: NEGATIVE
PH: 6 (ref 5.0–8.0)
Protein, ur: NEGATIVE mg/dL
SPECIFIC GRAVITY, URINE: 1.025 (ref 1.005–1.030)
Urobilinogen, UA: 0.2 mg/dL (ref 0.0–1.0)

## 2014-05-10 LAB — CBC
HEMATOCRIT: 37.2 % (ref 36.0–46.0)
Hemoglobin: 12.6 g/dL (ref 12.0–15.0)
MCH: 28.9 pg (ref 26.0–34.0)
MCHC: 33.9 g/dL (ref 30.0–36.0)
MCV: 85.3 fL (ref 78.0–100.0)
Platelets: 238 10*3/uL (ref 150–400)
RBC: 4.36 MIL/uL (ref 3.87–5.11)
RDW: 13.5 % (ref 11.5–15.5)
WBC: 9.2 10*3/uL (ref 4.0–10.5)

## 2014-05-10 LAB — WET PREP, GENITAL
TRICH WET PREP: NONE SEEN
Yeast Wet Prep HPF POC: NONE SEEN

## 2014-05-10 LAB — URINE MICROSCOPIC-ADD ON

## 2014-05-10 LAB — POCT PREGNANCY, URINE: Preg Test, Ur: POSITIVE — AB

## 2014-05-10 LAB — HCG, QUANTITATIVE, PREGNANCY: hCG, Beta Chain, Quant, S: 51486 m[IU]/mL — ABNORMAL HIGH (ref <5)

## 2014-05-10 MED ORDER — METRONIDAZOLE 500 MG PO TABS: 500.0000 mg | ORAL_TABLET | Freq: Two times a day (BID) | ORAL | Status: DC

## 2014-05-10 NOTE — Discharge Instructions (Signed)
Bacterial Vaginosis °Bacterial vaginosis is a vaginal infection that occurs when the normal balance of bacteria in the vagina is disrupted. It results from an overgrowth of certain bacteria. This is the most common vaginal infection in women of childbearing age. Treatment is important to prevent complications, especially in pregnant women, as it can cause a premature delivery. °CAUSES  °Bacterial vaginosis is caused by an increase in harmful bacteria that are normally present in smaller amounts in the vagina. Several different kinds of bacteria can cause bacterial vaginosis. However, the reason that the condition develops is not fully understood. °RISK FACTORS °Certain activities or behaviors can put you at an increased risk of developing bacterial vaginosis, including: °· Having a new sex partner or multiple sex partners. °· Douching. °· Using an intrauterine device (IUD) for contraception. °Women do not get bacterial vaginosis from toilet seats, bedding, swimming pools, or contact with objects around them. °SIGNS AND SYMPTOMS  °Some women with bacterial vaginosis have no signs or symptoms. Common symptoms include: °· Grey vaginal discharge. °· A fishlike odor with discharge, especially after sexual intercourse. °· Itching or burning of the vagina and vulva. °· Burning or pain with urination. °DIAGNOSIS  °Your health care provider will take a medical history and examine the vagina for signs of bacterial vaginosis. A sample of vaginal fluid may be taken. Your health care provider will look at this sample under a microscope to check for bacteria and abnormal cells. A vaginal pH test may also be done.  °TREATMENT  °Bacterial vaginosis may be treated with antibiotic medicines. These may be given in the form of a pill or a vaginal cream. A second round of antibiotics may be prescribed if the condition comes back after treatment.  °HOME CARE INSTRUCTIONS  °· Only take over-the-counter or prescription medicines as  directed by your health care provider. °· If antibiotic medicine was prescribed, take it as directed. Make sure you finish it even if you start to feel better. °· Do not have sex until treatment is completed. °· Tell all sexual partners that you have a vaginal infection. They should see their health care provider and be treated if they have problems, such as a mild rash or itching. °· Practice safe sex by using condoms and only having one sex partner. °SEEK MEDICAL CARE IF:  °· Your symptoms are not improving after 3 days of treatment. °· You have increased discharge or pain. °· You have a fever. °MAKE SURE YOU:  °· Understand these instructions. °· Will watch your condition. °· Will get help right away if you are not doing well or get worse. °FOR MORE INFORMATION  °Centers for Disease Control and Prevention, Division of STD Prevention: www.cdc.gov/std °American Sexual Health Association (ASHA): www.ashastd.org  °Document Released: 10/23/2005 Document Revised: 08/13/2013 Document Reviewed: 06/04/2013 °ExitCare® Patient Information ©2015 [employer]. This information is not intended to replace advice given to you by your health care provider. Make sure you discuss any questions you have with your health care provider. °Second Trimester of Pregnancy °The second trimester is from week 13 through week 28, months 4 through 6. The second trimester is often a time when you feel your best. Your body has also adjusted to being pregnant, and you begin to feel better physically. Usually, morning sickness has lessened or quit completely, you may have more energy, and you may have an increase in appetite. The second trimester is also a time when the fetus is growing rapidly. At the end of the sixth   month, the fetus is about 9 inches long and weighs about 1½ pounds. You will likely begin to feel the baby move (quickening) between 18 and 20 weeks of the pregnancy. °BODY CHANGES °Your body goes through many changes during  pregnancy. The changes vary from woman to woman.  °· Your weight will continue to increase. You will notice your lower abdomen bulging out. °· You may begin to get stretch marks on your hips, abdomen, and breasts. °· You may develop headaches that can be relieved by medicines approved by your health care provider. °· You may urinate more often because the fetus is pressing on your bladder. °· You may develop or continue to have heartburn as a result of your pregnancy. °· You may develop constipation because certain hormones are causing the muscles that push waste through your intestines to slow down. °· You may develop hemorrhoids or swollen, bulging veins (varicose veins). °· You may have back pain because of the weight gain and pregnancy hormones relaxing your joints between the bones in your pelvis and as a result of a shift in weight and the muscles that support your balance. °· Your breasts will continue to grow and be tender. °· Your gums may bleed and may be sensitive to brushing and flossing. °· Dark spots or blotches (chloasma, mask of pregnancy) may develop on your face. This will likely fade after the baby is born. °· A dark line from your belly button to the pubic area (linea nigra) may appear. This will likely fade after the baby is born. °· You may have changes in your hair. These can include thickening of your hair, rapid growth, and changes in texture. Some women also have hair loss during or after pregnancy, or hair that feels dry or thin. Your hair will most likely return to normal after your baby is born. °WHAT TO EXPECT AT YOUR PRENATAL VISITS °During a routine prenatal visit: °· You will be weighed to make sure you and the fetus are growing normally. °· Your blood pressure will be taken. °· Your abdomen will be measured to track your baby's growth. °· The fetal heartbeat will be listened to. °· Any test results from the previous visit will be discussed. °Your health care provider may ask  you: °· How you are feeling. °· If you are feeling the baby move. °· If you have had any abnormal symptoms, such as leaking fluid, bleeding, severe headaches, or abdominal cramping. °· If you have any questions. °Other tests that may be performed during your second trimester include: °· Blood tests that check for: °¨ Low iron levels (anemia). °¨ Gestational diabetes (between 24 and 28 weeks). °¨ Rh antibodies. °· Urine tests to check for infections, diabetes, or protein in the urine. °· An ultrasound to confirm the proper growth and development of the baby. °· An amniocentesis to check for possible genetic problems. °· Fetal screens for spina bifida and Down syndrome. °HOME CARE INSTRUCTIONS  °· Avoid all smoking, herbs, alcohol, and unprescribed drugs. These chemicals affect the formation and growth of the baby. °· Follow your health care provider's instructions regarding medicine use. There are medicines that are either safe or unsafe to take during pregnancy. °· Exercise only as directed by your health care provider. Experiencing uterine cramps is a good sign to stop exercising. °· Continue to eat regular, healthy meals. °· Wear a good support bra for breast tenderness. °· Do not use hot tubs, steam rooms, or saunas. °· Wear your seat   belt at all times when driving. °· Avoid raw meat, uncooked cheese, cat litter boxes, and soil used by cats. These carry germs that can cause birth defects in the baby. °· Take your prenatal vitamins. °· Try taking a stool softener (if your health care provider approves) if you develop constipation. Eat more high-fiber foods, such as fresh vegetables or fruit and whole grains. Drink plenty of fluids to keep your urine clear or pale yellow. °· Take warm sitz baths to soothe any pain or discomfort caused by hemorrhoids. Use hemorrhoid cream if your health care provider approves. °· If you develop varicose veins, wear support hose. Elevate your feet for 15 minutes, 3-4 times a day.  Limit salt in your diet. °· Avoid heavy lifting, wear low heel shoes, and practice good posture. °· Rest with your legs elevated if you have leg cramps or low back pain. °· Visit your dentist if you have not gone yet during your pregnancy. Use a soft toothbrush to brush your teeth and be gentle when you floss. °· A sexual relationship may be continued unless your health care provider directs you otherwise. °· Continue to go to all your prenatal visits as directed by your health care provider. °SEEK MEDICAL CARE IF:  °· You have dizziness. °· You have mild pelvic cramps, pelvic pressure, or nagging pain in the abdominal area. °· You have persistent nausea, vomiting, or diarrhea. °· You have a bad smelling vaginal discharge. °· You have pain with urination. °SEEK IMMEDIATE MEDICAL CARE IF:  °· You have a fever. °· You are leaking fluid from your vagina. °· You have spotting or bleeding from your vagina. °· You have severe abdominal cramping or pain. °· You have rapid weight gain or loss. °· You have shortness of breath with chest pain. °· You notice sudden or extreme swelling of your face, hands, ankles, feet, or legs. °· You have not felt your baby move in over an hour. °· You have severe headaches that do not go away with medicine. °· You have vision changes. °Document Released: 10/17/2001 Document Revised: 10/28/2013 Document Reviewed: 12/24/2012 °ExitCare® Patient Information ©2015 [employer]. This information is not intended to replace advice given to you by your health care provider. Make sure you discuss any questions you have with your health care provider. ° °

## 2014-05-10 NOTE — MAU Provider Note (Signed)
Attestation of Attending Supervision of Advanced Practitioner (CNM/NP): Evaluation and management procedures were performed by the Advanced Practitioner under my supervision and collaboration.  I have reviewed the Advanced Practitioner's note and chart, and I agree with the management and plan.  Per Beagley 05/10/2014 8:08 PM

## 2014-05-10 NOTE — MAU Note (Signed)
Pt presents to MAU with complaints of +HPT with vaginal spotting. Reports some cramping in her lower abdomen.

## 2014-05-10 NOTE — MAU Provider Note (Signed)
History     CSN: [id number]  Arrival date and time: 05/10/14 1315   First Provider Initiated Contact with Patient 05/10/14 1351      Chief Complaint  Patient presents with  . Possible Pregnancy  . Vaginal Bleeding   Possible Pregnancy  Vaginal Bleeding    Theresa [name] is a 23 y.o. [id number] at [id number] who presents today with cramping and spotting. She is also concerned because she has a vaginal discharge with odor. She states that she was treated for a UTI in April. She took all the meds, but did not take the pyridium she is concerned that she could have a kidney infection. She denies any fever.   Past Medical History  Diagnosis Date  . Chlamydia   . Gonorrhea   . Depression   . Pyelonephritis     X 2  . Eczema     Past Surgical History  Procedure Laterality Date  . No past surgeries      Family History  Problem Relation Age of Onset  . Birth defects Neg Hx   . Diabetes Neg Hx   . Stroke Neg Hx   . Hypertension Neg Hx   . Anesthesia problems Neg Hx   . Hypotension Neg Hx   . Malignant hyperthermia Neg Hx   . Pseudochol deficiency Neg Hx   . Asthma Daughter     History  Substance Use Topics  . Smoking status: Current Every Day Smoker -- 0.50 packs/day    Types: Cigarettes  . Smokeless tobacco: Never Used  . Alcohol Use: Yes     Comment: occas.    Allergies: No Known Allergies  Prescriptions prior to admission  Medication Sig Dispense Refill  . ciprofloxacin (CIPRO) 500 MG tablet Take 1 tablet (500 mg total) by mouth 2 (two) times daily.  20 tablet  0  . ibuprofen (ADVIL,MOTRIN) 600 MG tablet Take 1 tablet (600 mg total) by mouth every 6 (six) hours as needed for moderate pain (cramping).  30 tablet  1  . metroNIDAZOLE (FLAGYL) 500 MG tablet Take 1 tablet (500 mg total) by mouth 2 (two) times daily.  14 tablet  0  . oxyCODONE-acetaminophen (PERCOCET/ROXICET) 5-325 MG per tablet Take 1-2 tablets by mouth once.  30 tablet  0  . phenazopyridine (PYRIDIUM)  200 MG tablet Take 1 tablet (200 mg total) by mouth 3 (three) times daily as needed for pain.  15 tablet  0  . promethazine (PHENERGAN) 25 MG tablet Take 1 tablet (25 mg total) by mouth every 6 (six) hours as needed for nausea or vomiting.  30 tablet  1    Review of Systems  Genitourinary: Positive for vaginal bleeding.   Physical Exam   Last menstrual period 01/31/2014.  Physical Exam  Nursing note and vitals reviewed. Constitutional: She is oriented to person, place, and time. She appears well-developed and well-nourished. No distress.  Cardiovascular: Normal rate.   Respiratory: Effort normal.  GI: Soft. There is no tenderness. There is no rebound.  Genitourinary:   No CVA tenderness External: no lesion Vagina: small amount of white discharge Cervix: pink, smooth, no CMT Uterus: 14 weeks size uterus, FHT 165 with doppler    Neurological: She is alert and oriented to person, place, and time.  Skin: Skin is warm and dry.  Psychiatric: She has a normal mood and affect.    MAU Course  Procedures  Results for orders placed during the hospital encounter of 05/10/14 (from the past 24 hour(s))  URINALYSIS, ROUTINE W REFLEX MICROSCOPIC     Status: Abnormal   Collection Time    05/10/14  1:30 PM      Result Value Ref Range   Color, Urine YELLOW  YELLOW   APPearance CLEAR  CLEAR   Specific Gravity, Urine 1.025  1.005 - 1.030   pH 6.0  5.0 - 8.0   Glucose, UA NEGATIVE  NEGATIVE mg/dL   Hgb urine dipstick NEGATIVE  NEGATIVE   Bilirubin Urine NEGATIVE  NEGATIVE   Ketones, ur 15 (*) NEGATIVE mg/dL   Protein, ur NEGATIVE  NEGATIVE mg/dL   Urobilinogen, UA 0.2  0.0 - 1.0 mg/dL   Nitrite NEGATIVE  NEGATIVE   Leukocytes, UA TRACE (*) NEGATIVE  URINE MICROSCOPIC-ADD ON     Status: Abnormal   Collection Time    05/10/14  1:30 PM      Result Value Ref Range   Squamous Epithelial / LPF MANY (*) RARE   WBC, UA 3-6  <3 WBC/hpf   RBC / HPF 0-2  <3 RBC/hpf   Bacteria, UA MANY (*)  RARE  CBC     Status: None   Collection Time    05/10/14  1:37 PM      Result Value Ref Range   WBC 9.2  4.0 - 10.5 K/uL   RBC 4.36  3.87 - 5.11 MIL/uL   Hemoglobin 12.6  12.0 - 15.0 g/dL   HCT 78.237.2  95.636.0 - 21.346.0 %   MCV 85.3  78.0 - 100.0 fL   MCH 28.9  26.0 - 34.0 pg   MCHC 33.9  30.0 - 36.0 g/dL   RDW 08.613.5  57.811.5 - 46.915.5 %   Platelets 238  150 - 400 K/uL  HCG, QUANTITATIVE, PREGNANCY     Status: Abnormal   Collection Time    05/10/14  1:37 PM      Result Value Ref Range   hCG, Beta Chain, Quant, S [id number] (*) <5 mIU/mL  POCT PREGNANCY, URINE     Status: Abnormal   Collection Time    05/10/14  1:40 PM      Result Value Ref Range   Preg Test, Ur POSITIVE (*) NEGATIVE  WET PREP, GENITAL     Status: Abnormal   Collection Time    05/10/14  2:00 PM      Result Value Ref Range   Yeast Wet Prep HPF POC NONE SEEN  NONE SEEN   Trich, Wet Prep NONE SEEN  NONE SEEN   Clue Cells Wet Prep HPF POC FEW (*) NONE SEEN   WBC, Wet Prep HPF POC MODERATE (*) NONE SEEN    Assessment and Plan   1. BV (bacterial vaginosis)    Second trimester precautions reviewed RX flagyl Return to MAU as needed  Follow-up Information   Schedule an appointment as soon as possible for a visit with Kathreen CosierMARSHALL,BERNARD A, MD.   Specialty:  Obstetrics and Gynecology   Contact information:   [address] [place] [id number] [phone]        Tawnya CrookHogan, Heather Donovan 05/10/2014, 2:07 PM

## 2014-05-10 NOTE — MAU Note (Signed)
Pt reports +home preg. Test two months ago. Pt reports spotting 3 days ago and last week. Pt has not started prenatal care yet. Pt states that she was treated for a kidney infection, but stopped taking her medication when she realized that she was pregnant. Pt reports pain that comes and goes on the L side of abdomen.

## 2014-05-11 LAB — URINE CULTURE: Colony Count: 40000

## 2014-05-11 LAB — GC/CHLAMYDIA PROBE AMP
CT PROBE, AMP APTIMA: NEGATIVE
GC PROBE AMP APTIMA: NEGATIVE

## 2014-07-05 ENCOUNTER — Inpatient Hospital Stay (HOSPITAL_COMMUNITY): Payer: Medicaid Other

## 2014-07-05 ENCOUNTER — Inpatient Hospital Stay (HOSPITAL_COMMUNITY)
Admission: AD | Admit: 2014-07-05 | Discharge: 2014-07-05 | Disposition: A | Discharge status: Home / Self Care | Payer: Medicaid Other | Source: Ambulatory Visit | Attending: Obstetrics & Gynecology | Admitting: Obstetrics & Gynecology

## 2014-07-05 ENCOUNTER — Encounter (HOSPITAL_COMMUNITY): Payer: Self-pay

## 2014-07-05 DIAGNOSIS — IMO0002 Reserved for concepts with insufficient information to code with codable children: Secondary | ICD-10-CM

## 2014-07-05 DIAGNOSIS — O208 Other hemorrhage in early pregnancy: Secondary | ICD-10-CM

## 2014-07-05 DIAGNOSIS — B9689 Other specified bacterial agents as the cause of diseases classified elsewhere: Secondary | ICD-10-CM | POA: Insufficient documentation

## 2014-07-05 DIAGNOSIS — O0930 Supervision of pregnancy with insufficient antenatal care, unspecified trimester: Secondary | ICD-10-CM | POA: Insufficient documentation

## 2014-07-05 DIAGNOSIS — N760 Acute vaginitis: Secondary | ICD-10-CM | POA: Insufficient documentation

## 2014-07-05 DIAGNOSIS — D649 Anemia, unspecified: Secondary | ICD-10-CM | POA: Insufficient documentation

## 2014-07-05 DIAGNOSIS — O99330 Smoking (tobacco) complicating pregnancy, unspecified trimester: Secondary | ICD-10-CM | POA: Insufficient documentation

## 2014-07-05 DIAGNOSIS — O9989 Other specified diseases and conditions complicating pregnancy, childbirth and the puerperium: Secondary | ICD-10-CM

## 2014-07-05 DIAGNOSIS — O99019 Anemia complicating pregnancy, unspecified trimester: Secondary | ICD-10-CM | POA: Insufficient documentation

## 2014-07-05 DIAGNOSIS — O2390 Unspecified genitourinary tract infection in pregnancy, unspecified trimester: Secondary | ICD-10-CM | POA: Insufficient documentation

## 2014-07-05 DIAGNOSIS — O4690 Antepartum hemorrhage, unspecified, unspecified trimester: Secondary | ICD-10-CM | POA: Insufficient documentation

## 2014-07-05 DIAGNOSIS — R109 Unspecified abdominal pain: Secondary | ICD-10-CM | POA: Insufficient documentation

## 2014-07-05 DIAGNOSIS — A499 Bacterial infection, unspecified: Secondary | ICD-10-CM | POA: Insufficient documentation

## 2014-07-05 LAB — URINALYSIS, ROUTINE W REFLEX MICROSCOPIC
BILIRUBIN URINE: NEGATIVE
Glucose, UA: NEGATIVE mg/dL
Hgb urine dipstick: NEGATIVE
Ketones, ur: NEGATIVE mg/dL
Nitrite: NEGATIVE
Protein, ur: NEGATIVE mg/dL
Specific Gravity, Urine: >1.03 — ABNORMAL HIGH (ref 1.005–1.030)
UROBILINOGEN UA: 0.2 mg/dL (ref 0.0–1.0)
pH: 5.5 (ref 5.0–8.0)

## 2014-07-05 LAB — RAPID URINE DRUG SCREEN, HOSP PERFORMED
Amphetamines: NOT DETECTED
Barbiturates: NOT DETECTED
Benzodiazepines: NOT DETECTED
COCAINE: NOT DETECTED
Opiates: NOT DETECTED
TETRAHYDROCANNABINOL: POSITIVE — AB

## 2014-07-05 LAB — URINE MICROSCOPIC-ADD ON

## 2014-07-05 LAB — OB RESULTS CONSOLE GC/CHLAMYDIA
CHLAMYDIA, DNA PROBE: NEGATIVE
Gonorrhea: NEGATIVE

## 2014-07-05 LAB — CBC
HCT: 31.3 % — ABNORMAL LOW (ref 36.0–46.0)
Hemoglobin: 10.6 g/dL — ABNORMAL LOW (ref 12.0–15.0)
MCH: 29 pg (ref 26.0–34.0)
MCHC: 33.9 g/dL (ref 30.0–36.0)
MCV: 85.8 fL (ref 78.0–100.0)
Platelets: 187 10*3/uL (ref 150–400)
RBC: 3.65 MIL/uL — AB (ref 3.87–5.11)
RDW: 13.1 % (ref 11.5–15.5)
WBC: 9 10*3/uL (ref 4.0–10.5)

## 2014-07-05 LAB — WET PREP, GENITAL
Trich, Wet Prep: NONE SEEN
Yeast Wet Prep HPF POC: NONE SEEN

## 2014-07-05 MED ORDER — METRONIDAZOLE 500 MG PO TABS: 500.0000 mg | ORAL_TABLET | Freq: Two times a day (BID) | ORAL | Status: DC

## 2014-07-05 NOTE — Discharge Instructions (Signed)
Bacterial Vaginosis Bacterial vaginosis is a vaginal infection that occurs when the normal balance of bacteria in the vagina is disrupted. It results from an overgrowth of certain bacteria. This is the most common vaginal infection in women of childbearing age. Treatment is important to prevent complications, especially in pregnant women, as it can cause a premature delivery. CAUSES  Bacterial vaginosis is caused by an increase in harmful bacteria that are normally present in smaller amounts in the vagina. Several different kinds of bacteria can cause bacterial vaginosis. However, the reason that the condition develops is not fully understood. RISK FACTORS Certain activities or behaviors can put you at an increased risk of developing bacterial vaginosis, including:  Having a new sex partner or multiple sex partners.  Douching.  Using an intrauterine device (IUD) for contraception. Women do not get bacterial vaginosis from toilet seats, bedding, swimming pools, or contact with objects around them. SIGNS AND SYMPTOMS  Some women with bacterial vaginosis have no signs or symptoms. Common symptoms include:  Grey vaginal discharge.  A fishlike odor with discharge, especially after sexual intercourse.  Itching or burning of the vagina and vulva.  Burning or pain with urination. DIAGNOSIS  Your health care provider will take a medical history and examine the vagina for signs of bacterial vaginosis. A sample of vaginal fluid may be taken. Your health care provider will look at this sample under a microscope to check for bacteria and abnormal cells. A vaginal pH test may also be done.  TREATMENT  Bacterial vaginosis may be treated with antibiotic medicines. These may be given in the form of a pill or a vaginal cream. A second round of antibiotics may be prescribed if the condition comes back after treatment.  HOME CARE INSTRUCTIONS   Only take over-the-counter or prescription medicines as  directed by your health care provider.  If antibiotic medicine was prescribed, take it as directed. Make sure you finish it even if you start to feel better.  Do not have sex until treatment is completed.  Tell all sexual partners that you have a vaginal infection. They should see their health care provider and be treated if they have problems, such as a mild rash or itching.  Practice safe sex by using condoms and only having one sex partner. SEEK MEDICAL CARE IF:   Your symptoms are not improving after 3 days of treatment.  You have increased discharge or pain.  You have a fever. MAKE SURE YOU:   Understand these instructions.  Will watch your condition.  Will get help right away if you are not doing well or get worse. FOR MORE INFORMATION  Centers for Disease Control and Prevention, Division of STD Prevention: www.cdc.gov/std American Sexual Health Association (ASHA): www.ashastd.org  Document Released: 10/23/2005 Document Revised: 08/13/2013 Document Reviewed: 06/04/2013 ExitCare Patient Information 2015 [employer]. This information is not intended to replace advice given to you by your health care provider. Make sure you discuss any questions you have with your health care provider.  
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
## 2014-07-06 LAB — HIV ANTIBODY (ROUTINE TESTING W REFLEX): HIV 1&2 Ab, 4th Generation: NONREACTIVE

## 2014-07-06 LAB — RPR

## 2014-07-07 LAB — GC/CHLAMYDIA PROBE AMP
CT Probe RNA: NEGATIVE
GC PROBE AMP APTIMA: NEGATIVE

## 2014-09-07 ENCOUNTER — Encounter (HOSPITAL_COMMUNITY): Payer: Self-pay

## 2014-10-21 ENCOUNTER — Inpatient Hospital Stay (HOSPITAL_COMMUNITY)
Admission: AD | Admit: 2014-10-21 | Discharge: 2014-10-21 | Disposition: A | Discharge status: Home / Self Care | Payer: Medicaid Other | Source: Ambulatory Visit | Attending: Obstetrics | Admitting: Obstetrics

## 2014-10-21 ENCOUNTER — Encounter (HOSPITAL_COMMUNITY): Payer: Self-pay | Admitting: General Practice

## 2014-10-21 DIAGNOSIS — B3731 Acute candidiasis of vulva and vagina: Secondary | ICD-10-CM

## 2014-10-21 DIAGNOSIS — B373 Candidiasis of vulva and vagina: Secondary | ICD-10-CM | POA: Insufficient documentation

## 2014-10-21 DIAGNOSIS — K649 Unspecified hemorrhoids: Secondary | ICD-10-CM

## 2014-10-21 DIAGNOSIS — Z3A33 33 weeks gestation of pregnancy: Secondary | ICD-10-CM | POA: Insufficient documentation

## 2014-10-21 DIAGNOSIS — O2243 Hemorrhoids in pregnancy, third trimester: Secondary | ICD-10-CM | POA: Insufficient documentation

## 2014-10-21 DIAGNOSIS — O99333 Smoking (tobacco) complicating pregnancy, third trimester: Secondary | ICD-10-CM | POA: Diagnosis not present

## 2014-10-21 DIAGNOSIS — O98813 Other maternal infectious and parasitic diseases complicating pregnancy, third trimester: Secondary | ICD-10-CM | POA: Diagnosis not present

## 2014-10-21 DIAGNOSIS — F17210 Nicotine dependence, cigarettes, uncomplicated: Secondary | ICD-10-CM | POA: Diagnosis not present

## 2014-10-21 LAB — URINALYSIS, ROUTINE W REFLEX MICROSCOPIC
BILIRUBIN URINE: NEGATIVE
Glucose, UA: NEGATIVE mg/dL
HGB URINE DIPSTICK: NEGATIVE
KETONES UR: NEGATIVE mg/dL
Nitrite: NEGATIVE
PH: 5.5 (ref 5.0–8.0)
Protein, ur: NEGATIVE mg/dL
SPECIFIC GRAVITY, URINE: 1.02 (ref 1.005–1.030)
UROBILINOGEN UA: 0.2 mg/dL (ref 0.0–1.0)

## 2014-10-21 LAB — WET PREP, GENITAL: Trich, Wet Prep: NONE SEEN

## 2014-10-21 LAB — URINE MICROSCOPIC-ADD ON

## 2014-10-21 MED ORDER — HYDROCORTISONE ACE-PRAMOXINE 2.5-1 % RE CREA: 1.0000 "application " | TOPICAL_CREAM | Freq: Three times a day (TID) | RECTAL | Status: DC

## 2014-10-21 MED ORDER — FLUCONAZOLE 150 MG PO TABS
150.0000 mg | ORAL_TABLET | Freq: Once | ORAL | Status: DC
Start: 2014-10-21 — End: 2014-12-02

## 2014-10-21 MED ORDER — PRAMOXINE-HC 1-1 % EX FOAM
1.0000 | Freq: Three times a day (TID) | CUTANEOUS | Status: DC | PRN
  Filled 2014-10-21: qty 10

## 2014-10-21 MED ORDER — HYDROCORTISONE ACE-PRAMOXINE 2.5-1 % RE CREA: TOPICAL_CREAM | Freq: Once | RECTAL | Status: DC

## 2014-10-21 NOTE — MAU Provider Note (Signed)
History     CSN: [id number]  Arrival date and time: 10/21/14 1631   None     Chief Complaint  Patient presents with  . Hemorrhoids  . Contractions   HPI THis is a 23 y.o. female at 6635w6d who presents primarily for Rx for medicine to help with painful hemorrhoids. States had one since last birth, with onset of 1-2 new ones. They "popped out" after straining with BM today. Wants to try to replace them at home but wants analgesic first. Also has vaginal discharge with itching. Has missed last 4 appts because her brother got killed recently.   RN Note:  Expand All Collapse All   Pt presents complaining of hemorrhoids after straining with a bowel movement. Also having contractions every 15 minutes that work her from her sleep. Pt also thinks she has a yeast infection. Pt also wants cervical check.           OB History    Gravida Para Term Preterm AB TAB SAB Ectopic Multiple Living   3 2 2       2       Past Medical History  Diagnosis Date  . Chlamydia   . Gonorrhea   . Depression   . Pyelonephritis     X 2  . Eczema     Past Surgical History  Procedure Laterality Date  . No past surgeries      Family History  Problem Relation Age of Onset  . Birth defects Neg Hx   . Diabetes Neg Hx   . Stroke Neg Hx   . Hypertension Neg Hx   . Anesthesia problems Neg Hx   . Hypotension Neg Hx   . Malignant hyperthermia Neg Hx   . Pseudochol deficiency Neg Hx   . Asthma Daughter     History  Substance Use Topics  . Smoking status: Current Every Day Smoker -- 0.50 packs/day    Types: Cigarettes  . Smokeless tobacco: Never Used  . Alcohol Use: Yes     Comment: occas.    Allergies: No Known Allergies  Prescriptions prior to admission  Medication Sig Dispense Refill Last Dose  . Emollient (EUCERIN) lotion Apply topically as needed for dry skin.   Past Month at Unknown time  . metroNIDAZOLE (FLAGYL) 500 MG tablet Take 1 tablet (500 mg total) by mouth 2 (two) times  daily. 14 tablet 0     Review of Systems  Constitutional: Negative for fever, chills and malaise/fatigue.  Gastrointestinal: Negative for nausea, vomiting and abdominal pain.       Pain and swelling with hemorrhoids    Physical Exam   Blood pressure 109/57, pulse 94, temperature 98 F (36.7 C), temperature source Oral, resp. rate 18, height 5\' 1"  (1.549 m), weight 154 lb 3.2 oz (69.945 kg), last menstrual period 01/31/2014.  Physical Exam  Constitutional: She is oriented to person, place, and time. She appears well-developed and well-nourished. No distress.  HENT:  Head: Normocephalic.  Cardiovascular: Normal rate.   Respiratory: Effort normal.  GI: Soft. There is no tenderness. There is no rebound and no guarding.  Genitourinary:  Thick white adherent discharge Cervix long and closed FHR reactive Irregular mild contractions 2-3 large hemorrhoids protruding from anus  Musculoskeletal: Normal range of motion.  Neurological: She is alert and oriented to person, place, and time.  Skin: Skin is warm and dry.  Psychiatric: She has a normal mood and affect.    MAU Course  Procedures  MDM Results  for orders placed or performed during the hospital encounter of 10/21/14 (from the past 72 hour(s))  Wet prep, genital     Status: Abnormal   Collection Time: 10/21/14  5:16 PM  Result Value Ref Range   Yeast Wet Prep HPF POC FEW (A) NONE SEEN   Trich, Wet Prep NONE SEEN NONE SEEN   Clue Cells Wet Prep HPF POC FEW (A) NONE SEEN   WBC, Wet Prep HPF POC FEW (A) NONE SEEN    Comment: MANY BACTERIA SEEN  Urinalysis, Routine w reflex microscopic     Status: Abnormal   Collection Time: 10/21/14  5:52 PM  Result Value Ref Range   Color, Urine YELLOW YELLOW   APPearance CLEAR CLEAR   Specific Gravity, Urine 1.020 1.005 - 1.030   pH 5.5 5.0 - 8.0   Glucose, UA NEGATIVE NEGATIVE mg/dL   Hgb urine dipstick NEGATIVE NEGATIVE   Bilirubin Urine NEGATIVE NEGATIVE   Ketones, ur NEGATIVE  NEGATIVE mg/dL   Protein, ur NEGATIVE NEGATIVE mg/dL   Urobilinogen, UA 0.2 0.0 - 1.0 mg/dL   Nitrite NEGATIVE NEGATIVE   Leukocytes, UA TRACE (A) NEGATIVE  Urine microscopic-add on     Status: Abnormal   Collection Time: 10/21/14  5:52 PM  Result Value Ref Range   Squamous Epithelial / LPF FEW (A) RARE   WBC, UA 0-2 <3 WBC/hpf     Assessment and Plan  A:  SIUP at 4255w6d       Vaginal yeast       Protruding hemorrhoids       Uterine irritability with no cervical change   P:  Epifoam placed here       Rx Analpram HC       Push fluids at home, PTL precautions       Rx Diflucan       Encouraged her to call office in am and schedule a new appointment  [facility] LLCWILLIAMS,Garrit Marrow 10/21/2014, 4:52 PM

## 2014-10-21 NOTE — Discharge Instructions (Signed)
Candidal Vulvovaginitis Candidal vulvovaginitis is an infection of the vagina and vulva. The vulva is the skin around the opening of the vagina. This may cause itching and discomfort in and around the vagina.  HOME CARE  Only take medicine as told by your doctor.  Do not have sex (intercourse) until the infection is healed or as told by your doctor.  Practice safe sex.  Tell your sex partner about your infection.  Do not douche or use tampons.  Wear cotton underwear. Do not wear tight pants or panty hose.  Eat yogurt. This may help treat and prevent yeast infections. GET HELP RIGHT AWAY IF:   You have a fever.  Your problems get worse during treatment or do not get better in 3 days.  You have discomfort, irritation, or itching in your vagina or vulva area.  You have pain after sex.  You start to get belly (abdominal) pain. MAKE SURE YOU:  Understand these instructions.  Will watch your condition.  Will get help right away if you are not doing well or get worse. Document Released: 01/19/2009 Document Revised: 10/28/2013 Document Reviewed: 01/19/2009 [facility] Patient Information 2015 [employer], [place]. This information is not intended to replace advice given to you by your health care provider. Make sure you discuss any questions you have with your health care provider.  Hemorrhoids Hemorrhoids are swollen veins around the rectum or anus. There are two types of hemorrhoids:   Internal hemorrhoids. These occur in the veins just inside the rectum. They may poke through to the outside and become irritated and painful.  External hemorrhoids. These occur in the veins outside the anus and can be felt as a painful swelling or hard lump near the anus. CAUSES  Pregnancy.   Obesity.   Constipation or diarrhea.   Straining to have a bowel movement.   Sitting for long periods on the toilet.  Heavy lifting or other activity that caused you to strain.  Anal  intercourse. SYMPTOMS   Pain.   Anal itching or irritation.   Rectal bleeding.   Fecal leakage.   Anal swelling.   One or more lumps around the anus.  DIAGNOSIS  Your caregiver may be able to diagnose hemorrhoids by visual examination. Other examinations or tests that may be performed include:   Examination of the rectal area with a gloved hand (digital rectal exam).   Examination of anal canal using a small tube (scope).   A blood test if you have lost a significant amount of blood.  A test to look inside the colon (sigmoidoscopy or colonoscopy). TREATMENT Most hemorrhoids can be treated at home. However, if symptoms do not seem to be getting better or if you have a lot of rectal bleeding, your caregiver may perform a procedure to help make the hemorrhoids get smaller or remove them completely. Possible treatments include:   Placing a rubber band at the base of the hemorrhoid to cut off the circulation (rubber band ligation).   Injecting a chemical to shrink the hemorrhoid (sclerotherapy).   Using a tool to burn the hemorrhoid (infrared light therapy).   Surgically removing the hemorrhoid (hemorrhoidectomy).   Stapling the hemorrhoid to block blood flow to the tissue (hemorrhoid stapling).  HOME CARE INSTRUCTIONS   Eat foods with fiber, such as whole grains, beans, nuts, fruits, and vegetables. Ask your doctor about taking products with added fiber in them (fibersupplements).  Increase fluid intake. Drink enough water and fluids to keep your urine clear or pale  yellow.   Exercise regularly.   Go to the bathroom when you have the urge to have a bowel movement. Do not wait.   Avoid straining to have bowel movements.   Keep the anal area dry and clean. Use wet toilet paper or moist towelettes after a bowel movement.   Medicated creams and suppositories may be used or applied as directed.   Only take over-the-counter or prescription medicines as  directed by your caregiver.   Take warm sitz baths for 15-20 minutes, 3-4 times a day to ease pain and discomfort.   Place ice packs on the hemorrhoids if they are tender and swollen. Using ice packs between sitz baths may be helpful.   Put ice in a plastic bag.   Place a towel between your skin and the bag.   Leave the ice on for 15-20 minutes, 3-4 times a day.   Do not use a donut-shaped pillow or sit on the toilet for long periods. This increases blood pooling and pain.  SEEK MEDICAL CARE IF:  You have increasing pain and swelling that is not controlled by treatment or medicine.  You have uncontrolled bleeding.  You have difficulty or you are unable to have a bowel movement.  You have pain or inflammation outside the area of the hemorrhoids. MAKE SURE YOU:  Understand these instructions.  Will watch your condition.  Will get help right away if you are not doing well or get worse. Document Released: 10/20/2000 Document Revised: 10/09/2012 Document Reviewed: 08/27/2012 [facility] Patient Information 2015 [place], [place]. This information is not intended to replace advice given to you by your health care provider. Make sure you discuss any questions you have with your health care provider.

## 2014-10-21 NOTE — MAU Note (Signed)
Pt presents complaining of hemorrhoids after straining with a bowel movement. Also having contractions every 15 minutes that work her from her sleep. Pt also thinks she has a yeast infection. Pt also wants cervical check.

## 2014-11-06 NOTE — L&D Delivery Note (Signed)
Delivery Note At 6:18 PM a viable female was delivered via Vaginal, Spontaneous Delivery (Presentation: ; Occiput Anterior).  APGAR: 8, 9; weight  .   Placenta status: Intact, Spontaneous.  Cord: 3 vessels with the following complications: None.  Cord pH: not done  Anesthesia: Epidural  Episiotomy: None Lacerations: None Suture Repair: 2.0 Est. Blood Loss (mL): 250  Mom to postpartum.  Baby to Couplet care / Skin to Skin.  Kaydince Towles A 12/02/2014, 6:48 PM

## 2014-11-12 LAB — OB RESULTS CONSOLE GBS: GBS: NEGATIVE

## 2014-12-02 ENCOUNTER — Inpatient Hospital Stay (HOSPITAL_COMMUNITY): Payer: Medicaid Other | Admitting: Anesthesiology

## 2014-12-02 ENCOUNTER — Encounter (HOSPITAL_COMMUNITY): Payer: Self-pay

## 2014-12-02 ENCOUNTER — Inpatient Hospital Stay (HOSPITAL_COMMUNITY)
Admission: RE | Admit: 2014-12-02 | Discharge: 2014-12-04 | DRG: 775 | Disposition: A | Discharge status: Home / Self Care | Payer: Medicaid Other | Source: Ambulatory Visit | Attending: Obstetrics | Admitting: Obstetrics

## 2014-12-02 DIAGNOSIS — Z3A39 39 weeks gestation of pregnancy: Secondary | ICD-10-CM | POA: Diagnosis present

## 2014-12-02 LAB — TYPE AND SCREEN
ABO/RH(D): B POS
ANTIBODY SCREEN: NEGATIVE

## 2014-12-02 LAB — CBC
HCT: 32.1 % — ABNORMAL LOW (ref 36.0–46.0)
HEMOGLOBIN: 11 g/dL — AB (ref 12.0–15.0)
MCH: 29.1 pg (ref 26.0–34.0)
MCHC: 34.3 g/dL (ref 30.0–36.0)
MCV: 84.9 fL (ref 78.0–100.0)
Platelets: 248 10*3/uL (ref 150–400)
RBC: 3.78 MIL/uL — ABNORMAL LOW (ref 3.87–5.11)
RDW: 13.8 % (ref 11.5–15.5)
WBC: 9.5 10*3/uL (ref 4.0–10.5)

## 2014-12-02 MED ORDER — DIBUCAINE 1 % RE OINT
1.0000 "application " | TOPICAL_OINTMENT | RECTAL | Status: DC | PRN
  Filled 2014-12-02: qty 28

## 2014-12-02 MED ORDER — LANOLIN HYDROUS EX OINT: TOPICAL_OINTMENT | CUTANEOUS | Status: DC | PRN

## 2014-12-02 MED ORDER — IBUPROFEN 600 MG PO TABS
600.0000 mg | ORAL_TABLET | Freq: Four times a day (QID) | ORAL | Status: DC
Start: 2014-12-02 — End: 2014-12-04
  Administered 2014-12-02 – 2014-12-04 (×8): 600 mg via ORAL
  Filled 2014-12-02 (×8): qty 1

## 2014-12-02 MED ORDER — FLEET ENEMA 7-19 GM/118ML RE ENEM: 1.0000 | ENEMA | RECTAL | Status: DC | PRN

## 2014-12-02 MED ORDER — EPHEDRINE 5 MG/ML INJ: 10.0000 mg | INTRAVENOUS | Status: DC | PRN

## 2014-12-02 MED ORDER — SENNOSIDES-DOCUSATE SODIUM 8.6-50 MG PO TABS
2.0000 | ORAL_TABLET | ORAL | Status: DC
  Administered 2014-12-03: 2 via ORAL
  Filled 2014-12-02 (×2): qty 2

## 2014-12-02 MED ORDER — OXYCODONE-ACETAMINOPHEN 5-325 MG PO TABS
1.0000 | ORAL_TABLET | ORAL | Status: DC | PRN
  Administered 2014-12-03: 1 via ORAL
  Filled 2014-12-02: qty 1

## 2014-12-02 MED ORDER — ONDANSETRON HCL 4 MG/2ML IJ SOLN: 4.0000 mg | Freq: Four times a day (QID) | INTRAMUSCULAR | Status: DC | PRN

## 2014-12-02 MED ORDER — LIDOCAINE HCL (PF) 1 % IJ SOLN: 30.0000 mL | INTRAMUSCULAR | Status: DC | PRN

## 2014-12-02 MED ORDER — FENTANYL 2.5 MCG/ML BUPIVACAINE 1/10 % EPIDURAL INFUSION (WH - ANES)
INTRAMUSCULAR | Status: DC | PRN
  Administered 2014-12-02: 14 mL/h via EPIDURAL

## 2014-12-02 MED ORDER — TETANUS-DIPHTH-ACELL PERTUSSIS 5-2.5-18.5 LF-MCG/0.5 IM SUSP
0.5000 mL | Freq: Once | INTRAMUSCULAR | Status: DC
  Filled 2014-12-02: qty 0.5

## 2014-12-02 MED ORDER — OXYCODONE-ACETAMINOPHEN 5-325 MG PO TABS: 2.0000 | ORAL_TABLET | ORAL | Status: DC | PRN

## 2014-12-02 MED ORDER — TERBUTALINE SULFATE 1 MG/ML IJ SOLN: 0.2500 mg | Freq: Once | INTRAMUSCULAR | Status: DC | PRN

## 2014-12-02 MED ORDER — ZOLPIDEM TARTRATE 5 MG PO TABS: 5.0000 mg | ORAL_TABLET | Freq: Every evening | ORAL | Status: DC | PRN

## 2014-12-02 MED ORDER — PHENYLEPHRINE 40 MCG/ML (10ML) SYRINGE FOR IV PUSH (FOR BLOOD PRESSURE SUPPORT)
80.0000 ug | PREFILLED_SYRINGE | INTRAVENOUS | Status: DC | PRN
  Filled 2014-12-02: qty 20

## 2014-12-02 MED ORDER — LACTATED RINGERS IV SOLN: 500.0000 mL | Freq: Once | INTRAVENOUS | Status: DC

## 2014-12-02 MED ORDER — DIPHENHYDRAMINE HCL 50 MG/ML IJ SOLN: 12.5000 mg | INTRAMUSCULAR | Status: DC | PRN

## 2014-12-02 MED ORDER — LIDOCAINE HCL (PF) 1 % IJ SOLN
INTRAMUSCULAR | Status: DC | PRN
  Administered 2014-12-02 (×2): 4 mL

## 2014-12-02 MED ORDER — PRENATAL MULTIVITAMIN CH
1.0000 | ORAL_TABLET | Freq: Every day | ORAL | Status: DC
  Administered 2014-12-03 – 2014-12-04 (×2): 1 via ORAL
  Filled 2014-12-02 (×2): qty 1

## 2014-12-02 MED ORDER — ONDANSETRON HCL 4 MG/2ML IJ SOLN: 4.0000 mg | INTRAMUSCULAR | Status: DC | PRN

## 2014-12-02 MED ORDER — LACTATED RINGERS IV SOLN
500.0000 mL | INTRAVENOUS | Status: DC | PRN
  Administered 2014-12-02: 500 mL via INTRAVENOUS

## 2014-12-02 MED ORDER — OXYTOCIN BOLUS FROM INFUSION: 500.0000 mL | INTRAVENOUS | Status: DC

## 2014-12-02 MED ORDER — PHENYLEPHRINE 40 MCG/ML (10ML) SYRINGE FOR IV PUSH (FOR BLOOD PRESSURE SUPPORT): 80.0000 ug | PREFILLED_SYRINGE | INTRAVENOUS | Status: DC | PRN

## 2014-12-02 MED ORDER — FERROUS SULFATE 325 (65 FE) MG PO TABS
325.0000 mg | ORAL_TABLET | Freq: Two times a day (BID) | ORAL | Status: DC
  Filled 2014-12-02 (×2): qty 1

## 2014-12-02 MED ORDER — ACETAMINOPHEN 325 MG PO TABS: 650.0000 mg | ORAL_TABLET | ORAL | Status: DC | PRN

## 2014-12-02 MED ORDER — SIMETHICONE 80 MG PO CHEW: 80.0000 mg | CHEWABLE_TABLET | ORAL | Status: DC | PRN

## 2014-12-02 MED ORDER — OXYCODONE-ACETAMINOPHEN 5-325 MG PO TABS
1.0000 | ORAL_TABLET | ORAL | Status: DC | PRN
  Administered 2014-12-03 – 2014-12-04 (×2): 1 via ORAL
  Filled 2014-12-02: qty 1

## 2014-12-02 MED ORDER — BENZOCAINE-MENTHOL 20-0.5 % EX AERO
1.0000 "application " | INHALATION_SPRAY | CUTANEOUS | Status: DC | PRN
  Filled 2014-12-02: qty 56

## 2014-12-02 MED ORDER — LACTATED RINGERS IV SOLN
INTRAVENOUS | Status: DC
  Administered 2014-12-02: 09:00:00 via INTRAVENOUS

## 2014-12-02 MED ORDER — CITRIC ACID-SODIUM CITRATE 334-500 MG/5ML PO SOLN: 30.0000 mL | ORAL | Status: DC | PRN

## 2014-12-02 MED ORDER — ONDANSETRON HCL 4 MG PO TABS: 4.0000 mg | ORAL_TABLET | ORAL | Status: DC | PRN

## 2014-12-02 MED ORDER — BUTORPHANOL TARTRATE 1 MG/ML IJ SOLN
1.0000 mg | INTRAMUSCULAR | Status: DC | PRN
  Administered 2014-12-02 (×2): 1 mg via INTRAVENOUS
  Filled 2014-12-02 (×3): qty 1

## 2014-12-02 MED ORDER — OXYTOCIN 40 UNITS IN LACTATED RINGERS INFUSION - SIMPLE MED
1.0000 m[IU]/min | INTRAVENOUS | Status: DC
  Administered 2014-12-02: 2 m[IU]/min via INTRAVENOUS

## 2014-12-02 MED ORDER — DIPHENHYDRAMINE HCL 25 MG PO CAPS: 25.0000 mg | ORAL_CAPSULE | Freq: Four times a day (QID) | ORAL | Status: DC | PRN

## 2014-12-02 MED ORDER — WITCH HAZEL-GLYCERIN EX PADS: 1.0000 "application " | MEDICATED_PAD | CUTANEOUS | Status: DC | PRN

## 2014-12-02 MED ORDER — OXYTOCIN 40 UNITS IN LACTATED RINGERS INFUSION - SIMPLE MED
62.5000 mL/h | INTRAVENOUS | Status: DC
  Filled 2014-12-02: qty 1000

## 2014-12-02 MED ORDER — FENTANYL 2.5 MCG/ML BUPIVACAINE 1/10 % EPIDURAL INFUSION (WH - ANES)
14.0000 mL/h | INTRAMUSCULAR | Status: DC | PRN
  Administered 2014-12-02: 14 mL/h via EPIDURAL
  Filled 2014-12-02: qty 125

## 2014-12-02 NOTE — Anesthesia Procedure Notes (Signed)
Epidural Patient location during procedure: OB Start time: 12/02/2014 5:41 PM  Staffing Anesthesiologist: Nitin Mckowen A. Performed by: anesthesiologist   Preanesthetic Checklist Completed: patient identified, site marked, surgical consent, pre-op evaluation, timeout performed, IV checked, risks and benefits discussed and monitors and equipment checked  Epidural Patient position: sitting Prep: site prepped and draped and DuraPrep Patient monitoring: continuous pulse ox and blood pressure Approach: midline Location: L4-L5 Injection technique: LOR air  Needle:  Needle type: Tuohy  Needle gauge: 17 G Needle length: 9 cm and 9 Needle insertion depth: 5 cm cm Catheter type: closed end flexible Catheter size: 19 Gauge Catheter at skin depth: 10 cm Test dose: negative and Other  Assessment Events: blood not aspirated, injection not painful, no injection resistance, negative IV test and no paresthesia  Additional Notes Patient identified. Risks and benefits discussed including failed block, incomplete  Pain control, post dural puncture headache, nerve damage, paralysis, blood pressure Changes, nausea, vomiting, reactions to medications-both toxic and allergic and post Partum back pain. All questions were answered. Patient expressed understanding and wished to proceed. Sterile technique was used throughout procedure. Epidural site was Dressed with sterile barrier dressing. No paresthesias, signs of intravascular injection Or signs of intrathecal spread were encountered.  Patient was more comfortable after the epidural was dosed. Please see RN's note for documentation of vital signs and FHR which are stable.

## 2014-12-02 NOTE — Anesthesia Preprocedure Evaluation (Addendum)
Anesthesia Evaluation  Patient identified by MRN, date of birth, ID band Patient awake    Reviewed: Allergy & Precautions, NPO status , Patient's Chart, lab work & pertinent test results  Airway Mallampati: II  TM Distance: >3 FB Neck ROM: Full    Dental no notable dental hx. (+) Teeth Intact   Pulmonary Current Smoker,  breath sounds clear to auscultation  Pulmonary exam normal       Cardiovascular negative cardio ROS  Rhythm:Regular Rate:Normal     Neuro/Psych PSYCHIATRIC DISORDERS Depression negative neurological ROS     GI/Hepatic negative GI ROS, (+)     substance abuse  marijuana use,   Endo/Other    Renal/GU Renal diseaseHx/o Pyelonephritis  negative genitourinary   Musculoskeletal negative musculoskeletal ROS (+)   Abdominal (+) + obese,   Peds  Hematology  (+) anemia ,   Anesthesia Other Findings   Reproductive/Obstetrics (+) Pregnancy STD's                            Anesthesia Physical Anesthesia Plan  ASA: II  Anesthesia Plan: Epidural   Post-op Pain Management:    Induction:   Airway Management Planned: Natural Airway  Additional Equipment:   Intra-op Plan:   Post-operative Plan:   Informed Consent: I have reviewed the patients History and Physical, chart, labs and discussed the procedure including the risks, benefits and alternatives for the proposed anesthesia with the patient or authorized representative who has indicated his/her understanding and acceptance.     Plan Discussed with: Anesthesiologist  Anesthesia Plan Comments:         Anesthesia Quick Evaluation

## 2014-12-02 NOTE — H&P (Signed)
This is Dr. Francoise CeoBernard Mariacristina Aday dictating history and physical on  Theresa [name]  she's a 24 year old gravida 3 para 12/08/1939 weeks and 6 days  Demands  to be induced has had limited prenatal care because she does like to keep her appointments her GBS is negative and she is in for induction her cervix is 2 cm very posterior vertex -2 amniotomy performed no fluid seen she is on low-dose Pitocin Past medical history negative Past surgical history negative Social history negative System review negative Physical exam well-developed female in no distress HEENT negative Lungs clear to P&A Heart regular rhythm no murmurs no gallops Breasts negative Abdomen term Pelvic as described above Extremities negative

## 2014-12-03 LAB — CBC
HCT: 31.5 % — ABNORMAL LOW (ref 36.0–46.0)
Hemoglobin: 10.7 g/dL — ABNORMAL LOW (ref 12.0–15.0)
MCH: 29.1 pg (ref 26.0–34.0)
MCHC: 34 g/dL (ref 30.0–36.0)
MCV: 85.6 fL (ref 78.0–100.0)
PLATELETS: 229 10*3/uL (ref 150–400)
RBC: 3.68 MIL/uL — AB (ref 3.87–5.11)
RDW: 13.8 % (ref 11.5–15.5)
WBC: 11.6 10*3/uL — ABNORMAL HIGH (ref 4.0–10.5)

## 2014-12-03 LAB — RPR: RPR Ser Ql: NONREACTIVE

## 2014-12-03 NOTE — Lactation Note (Signed)
This note was copied from the chart of Theresa [name]. Lactation Consultation Note  Patient Name: Theresa Birdie SonsJamel Kopplin [id number]'s Date: 12/03/2014 Reason for consult: Initial assessment  Baby 19 hours old and according to doc flow sheets has been to the breast x 3 for average feeding range of 10 -20 mins , and 1 attempt. @ consult baby showing signs of hunger, LC placed baby skin to skin and assisted mom to latch on the right breast football position. Assisted mom to obtain depth and achieved , multiply swallows noted , increased with breast compressions. Prior to latch - LC reviewed and  showed mom how hand express, steady flow of colostrum noted. Baby fed for 8 mins in a consistent pattern and released. LC assisted mom to releatch Same position. Per mom the nurse plans to set up the pump for me. Mother informed of post-discharge support and given phone number to the lactation department, including services for phone call assistance; out-patient appointments; and breastfeeding support group. List of other breastfeeding resources in the community given in the handout. Encouraged mother to call for problems or concerns related to breastfeeding.    Maternal Data Has patient been taught Hand Expression?: Yes Does the patient have breastfeeding experience prior to this delivery?: Yes  Feeding Feeding Type: Breast Fed Length of feed: 8 min  LATCH Score/Interventions Latch: Grasps breast easily, tongue down, lips flanged, rhythmical sucking.  Audible Swallowing: Spontaneous and intermittent  Type of Nipple: Everted at rest and after stimulation  Comfort (Breast/Nipple): Soft / non-tender     Hold (Positioning): Full assist, staff holds infant at breast Intervention(s): Breastfeeding basics reviewed;Support Pillows;Position options;Skin to skin  LATCH Score: 8  Lactation Tools Discussed/Used WIC Program: No   Consult Status Consult Status: Follow-up Date: 12/04/14 Follow-up type:  In-patient    Kathrin Greathouseorio, Jerline Linzy Ann 12/03/2014, 1:50 PM

## 2014-12-03 NOTE — Progress Notes (Signed)
Post Partum Day 1 Subjective: no complaints  Objective: Blood pressure 106/57, pulse 65, temperature 98.1 F (36.7 C), temperature source Oral, resp. rate 18, height 5\' 1"  (1.549 m), weight 152 lb (68.947 kg), last menstrual period 01/31/2014, SpO2 99 %, unknown if currently breastfeeding.  Physical Exam:  General: alert and no distress Lochia: appropriate Uterine Fundus: firm Incision: none DVT Evaluation: No evidence of DVT seen on physical exam.   Recent Labs  12/02/14 0855 12/03/14 0640  HGB 11.0* 10.7*  HCT 32.1* 31.5*    Assessment/Plan: Plan for discharge tomorrow   LOS: 1 day   HARPER,CHARLES A 12/03/2014, 7:25 AM

## 2014-12-03 NOTE — Progress Notes (Signed)
CSW acknowledges consult for late/limited prenatal care.  CSW attempted to meet with the [name].  Lactation consultant was in the room, and [name] had numerous visitors in her room.   CSW will make second attempt on 1/29.

## 2014-12-03 NOTE — Anesthesia Postprocedure Evaluation (Signed)
Anesthesia Post Note  Patient: Theresa [name]  Procedure(s) Performed: * No procedures listed *  Anesthesia type: Epidural  Patient location: Mother/Baby  Post pain: Pain level controlled  Post assessment: Post-op Vital signs reviewed  Last Vitals:  Filed Vitals:   12/03/14 1015  BP: 104/55  Pulse: 69  Temp: 36.9 C  Resp: 18    Post vital signs: Reviewed  Level of consciousness:alert  Complications: No apparent anesthesia complications

## 2014-12-03 NOTE — Progress Notes (Signed)
Ur chart review completed.  

## 2014-12-03 NOTE — Lactation Note (Signed)
This note was copied from the chart of Theresa [name]. Lactation Consultation Note  Patient Name: Theresa [name]'s Date: 1/2[name]8/2016 Reason for consult: Initial assessment  Spoke with Theresa PolkaMBURN [name] and she plans to set up the DEBP for mom , due to baby being >6 pounds.    Maternal Data Has patient been taught Hand Expression?: Yes Does the patient have breastfeeding experience prior to this delivery?: Yes  Feeding Feeding Type: Breast Fed Length of feed:  (re-latched and still feeding , per mom comfortable )  LATCH Score/Interventions Latch: Grasps breast easily, tongue down, lips flanged, rhythmical sucking.  Audible Swallowing: Spontaneous and intermittent  Type of Nipple: Everted at rest and after stimulation  Comfort (Breast/Nipple): Soft / non-tender     Hold (Positioning): Full assist, staff holds infant at breast Intervention(s): Breastfeeding basics reviewed;Support Pillows;Position options;Skin to skin  LATCH Score: 8  Lactation Tools Discussed/Used WIC Program: No   Consult Status Consult Status: Follow-up Date: 12/04/14 Follow-up type: In-patient    Theresa Greathouseorio, Theresa [name] Ann 12/03/2014, 4:47 PM

## 2014-12-04 MED ORDER — OXYCODONE-ACETAMINOPHEN 5-325 MG PO TABS: 1.0000 | ORAL_TABLET | ORAL | Status: DC | PRN

## 2014-12-04 MED ORDER — IBUPROFEN 600 MG PO TABS: 600.0000 mg | ORAL_TABLET | Freq: Four times a day (QID) | ORAL | Status: DC | PRN

## 2014-12-04 NOTE — Plan of Care (Signed)
Problem: Discharge Progression Outcomes Goal: MMR given as ordered Outcome: Not Applicable Date Met:  33/54/56 Pt refused

## 2014-12-04 NOTE — Progress Notes (Signed)
Post Partum Day 2 Subjective: no complaints  Objective: Blood pressure 106/56, pulse 63, temperature 98.6 F (37 C), temperature source Oral, resp. rate 18, height 5\' 1"  (1.549 m), weight 152 lb (68.947 kg), last menstrual period 01/31/2014, SpO2 99 %, unknown if currently breastfeeding.  Physical Exam:  General: alert and no distress Lochia: appropriate Uterine Fundus: firm Incision: none DVT Evaluation: No evidence of DVT seen on physical exam.   Recent Labs  12/02/14 0855 12/03/14 0640  HGB 11.0* 10.7*  HCT 32.1* 31.5*    Assessment/Plan: Discharge home   LOS: 2 days   Theresa [name] A 12/04/2014, 7:57 AM

## 2014-12-04 NOTE — Discharge Summary (Signed)
Obstetric Discharge Summary Reason for Admission: induction of labor Prenatal Procedures: ultrasound Intrapartum Procedures: spontaneous vaginal delivery Postpartum Procedures: none Complications-Operative and Postpartum: none HEMOGLOBIN  Date Value Ref Range Status  12/03/2014 10.7* 12.0 - 15.0 g/dL Final   HCT  Date Value Ref Range Status  12/03/2014 31.5* 36.0 - 46.0 % Final    Physical Exam:  General: alert and no distress Lochia: appropriate Uterine Fundus: firm Incision: none DVT Evaluation: No evidence of DVT seen on physical exam.  Discharge Diagnoses: Term Pregnancy-delivered  Discharge Information: Date: 12/04/2014 Activity: pelvic rest Diet: routine Medications: PNV, Ibuprofen, Colace and Percocet Condition: stable Instructions: refer to practice specific booklet Discharge to: home Follow-up Information    Follow up with MARSHALL,BERNARD A, MD. Schedule an appointment as soon as possible for a visit in 6 weeks.   Specialty:  Obstetrics and Gynecology   Contact information:   [address] [place] [id number] [phone]       Newborn Data: Live born female  Birth Weight: 5 lb 5.4 oz (2421 g) APGAR: 8, 9  Home with mother.  Mai Longnecker A 12/04/2014, 8:01 AM

## 2014-12-04 NOTE — Lactation Note (Signed)
This note was copied from the chart of Theresa Jerilyn Windish. Lactation Consultation Note  Patient Name: Theresa [name] ZOXWR'UToday's Date: 12/04/2014 Reason for consult: Follow-up assessment  Baby is 2344 hours old and was re-weighed at 1415 - weight increased to 5-2 oz , 2275 g  Per mom latching at the breast, but also feeding formula from a bottle. Per mom also pumping some in the last 24 hours and feeding back to baby. [facility] ,[facility] and term. LC stressed the importance that the baby needs to be feeding at least every 3 hours  And also to supplement after feeding at the breast 20 -30 ml to keep the baby's energy up due to short anterior  Frenulum. And to post pump both breast. Per mom has been using comfort gels due to tender nipples ( LC assessed with moms permission and no signs  of breakdown just sore tender , also milk is coming in, easily expressed).  LC assisted mom with latch and worked on depth and positioning. Multiply swallows noted, increased with breast compressions. Sore nipple and engorgement prevention and tx reviewed and referred to the Baby and me booklet pages 24 -25. Mother informed of post-discharge support and given phone number to the lactation department, including services for phone call assistance; out-patient appointments; and breastfeeding support group. List of other breastfeeding resources in the community given in the handout. Encouraged mother to call for problems or concerns related to breastfeeding. [facility], [facility] at 4 pm , Apt reminder given to mom.    Maternal Data Has patient been taught Hand Expression?: Yes  Feeding Feeding Type: Breast Fed Length of feed: 7 min (consistent pattern with swallows )  LATCH Score/Interventions Latch: Grasps breast easily, tongue down, lips flanged, rhythmical sucking. (this feeding was after baby had fed twice and supplemented)  Audible Swallowing: Spontaneous and  intermittent  Type of Nipple: Everted at rest and after stimulation  Comfort (Breast/Nipple): Soft / non-tender     Hold (Positioning): Assistance needed to correctly position infant at breast and maintain latch. Intervention(s): Breastfeeding basics reviewed;Support Pillows;Position options;Skin to skin  LATCH Score: 9  Lactation Tools Discussed/Used Pump Review: Milk Storage Initiated by:: MAI  Date initiated:: 12/04/14   Consult Status Consult Status: Follow-up Date: 12/11/14 (O/P reminder paper given to mom ) Follow-up type: Out-patient    Theresa [name], Theresa [name] Ann 12/04/2014, 3:23 PM

## 2014-12-04 NOTE — Progress Notes (Signed)
Clinical Social Work Department PSYCHOSOCIAL ASSESSMENT - MATERNAL/CHILD 12/04/2014  Patient:  [name],[name]  Account Number:  [id number]  Admit Date:  12/02/2014  Childs Name:   Theresa [name]   Clinical Social Worker:  Mariane Burpee, CLINICAL SOCIAL WORKER   Date/Time:  12/04/2014 11:30 AM  Date Referred:  12/02/2014   Referral source  Central Nursery     Referred reason  LPNC   Other referral source:    I:  FAMILY / HOME ENVIRONMENT Child's legal guardian:  PARENT  Guardian - Name Guardian - Age Guardian - Address  Theresa [name] 23 [address] [place], [place] [address]  Theresa [name]  different residence   Other household support members/support persons Name Relationship DOB   Theresa [name]    Theresa [name] April 2013   Theresa [name] November 2010   Other support:    II  PSYCHOSOCIAL DATA Information Source:  Patient Interview  Financial and Community Resources Employment:   [name] stated that she is unemployed.   Financial resources:  Medicaid If Medicaid - County:  [place]  School / Grade:  N/A Maternity Care Coordinator / Child Services Coordination / Early Interventions:   None reported  Cultural issues impacting care:   None reported    III  STRENGTHS Strengths  Adequate Resources  Home prepared for Child (including basic supplies)  Supportive family/friends   Strength comment:    IV  RISK FACTORS AND CURRENT PROBLEMS Current Problem:  YES   Risk Factor & Current Problem Patient Issue Family Issue Risk Factor / Current Problem Comment  Other - See comment Y N Pediatrician reported limited prenatal care. UDS and MDS are pending.  Substance Abuse Y N [name] had a positive UDS for THC in July 05, 2014.    V  SOCIAL WORK ASSESSMENT CSW met with the [name] due to limited prenatal care.  [name] was pleasant and displayed an appropriate range in affect; however, she was a limited historian as she presented as superificial and difficult to genuinely engage in the conversation.   She was observed to be interacting and bonding with the baby, no acute mental health symptoms noted.   [name] denied questions, concerns, or stressors secondary to transition to postpartum period.  She endorsed strong family support, and stated that she believes her other children are excited for the arrival of Theresa.  [name] discussed having all basic baby items as well.  CSW noted in CSW assessment April 2013, that [name] had ongoing legal stressors.  CSW inquired about any pending/current legal concerns, she denied any stress associated with legal charges.  [name] denied mental health history or history of PPD.   CSW inquired about limited prenatal care.  [name] discussed difficulties obtaining Medicaid, and denied additional barriers to care. She verbalized understanding of hospital drug screen policy.  [name] denied any substance use during pregnancy, was not forthcoming with THC use (UDS positive in August).  [name] aware that CPS report will be made if UDS/MDS are positive.  [name] denied concerns, and denied history of CPS involvement   CSW spoke to [place] CPS Intake worker, who denied current/open CPS case.   No barriers to discharge.  VI SOCIAL WORK PLAN Social Work Plan  Patient/Family Education  No Further Intervention Required / No Barriers to Discharge   Type of pt/family education:   Postpartum depression  Hospital drug screen policy   If child protective services report - county:  N/A If child protective services report - date:  N/A Information/referral to   community resources comment:   No referrals at this time.   Other social work plan:   CSW to follow up as needed.  CSW to monitor MDS and will make a CPS report if needed.

## 2014-12-11 ENCOUNTER — Ambulatory Visit (HOSPITAL_COMMUNITY): Payer: Medicaid Other

## 2015-04-15 ENCOUNTER — Inpatient Hospital Stay (HOSPITAL_COMMUNITY)
Admission: AD | Admit: 2015-04-15 | Discharge: 2015-04-15 | Disposition: A | Discharge status: Home / Self Care | Payer: Medicaid Other | Source: Ambulatory Visit | Attending: Obstetrics | Admitting: Obstetrics

## 2015-04-15 ENCOUNTER — Encounter (HOSPITAL_COMMUNITY): Payer: Self-pay | Admitting: *Deleted

## 2015-04-15 DIAGNOSIS — N939 Abnormal uterine and vaginal bleeding, unspecified: Secondary | ICD-10-CM | POA: Diagnosis present

## 2015-04-15 DIAGNOSIS — N390 Urinary tract infection, site not specified: Secondary | ICD-10-CM | POA: Insufficient documentation

## 2015-04-15 DIAGNOSIS — N760 Acute vaginitis: Secondary | ICD-10-CM | POA: Insufficient documentation

## 2015-04-15 DIAGNOSIS — N3001 Acute cystitis with hematuria: Secondary | ICD-10-CM

## 2015-04-15 DIAGNOSIS — B9689 Other specified bacterial agents as the cause of diseases classified elsewhere: Secondary | ICD-10-CM

## 2015-04-15 DIAGNOSIS — F17210 Nicotine dependence, cigarettes, uncomplicated: Secondary | ICD-10-CM | POA: Insufficient documentation

## 2015-04-15 DIAGNOSIS — Z91040 Latex allergy status: Secondary | ICD-10-CM | POA: Diagnosis not present

## 2015-04-15 LAB — URINALYSIS, ROUTINE W REFLEX MICROSCOPIC
Bilirubin Urine: NEGATIVE
Glucose, UA: NEGATIVE mg/dL
Hgb urine dipstick: NEGATIVE
Ketones, ur: NEGATIVE mg/dL
Nitrite: POSITIVE — AB
PH: 6 (ref 5.0–8.0)
Protein, ur: NEGATIVE mg/dL
SPECIFIC GRAVITY, URINE: 1.025 (ref 1.005–1.030)
Urobilinogen, UA: 0.2 mg/dL (ref 0.0–1.0)

## 2015-04-15 LAB — URINE MICROSCOPIC-ADD ON

## 2015-04-15 LAB — POCT PREGNANCY, URINE: Preg Test, Ur: NEGATIVE

## 2015-04-15 LAB — WET PREP, GENITAL
Trich, Wet Prep: NONE SEEN
Yeast Wet Prep HPF POC: NONE SEEN

## 2015-04-15 MED ORDER — METRONIDAZOLE 500 MG PO TABS: 500.0000 mg | ORAL_TABLET | Freq: Two times a day (BID) | ORAL | Status: DC

## 2015-04-15 MED ORDER — SULFAMETHOXAZOLE-TRIMETHOPRIM 800-160 MG PO TABS: 1.0000 | ORAL_TABLET | Freq: Two times a day (BID) | ORAL | Status: AC

## 2015-04-15 MED ORDER — SULFAMETHOXAZOLE-TRIMETHOPRIM 800-160 MG PO TABS: 1.0000 | ORAL_TABLET | Freq: Two times a day (BID) | ORAL | Status: DC

## 2015-04-15 NOTE — Discharge Instructions (Signed)
Bacterial Vaginosis °Bacterial vaginosis is a vaginal infection that occurs when the normal balance of bacteria in the vagina is disrupted. It results from an overgrowth of certain bacteria. This is the most common vaginal infection in women of childbearing age. Treatment is important to prevent complications, especially in pregnant women, as it can cause a premature delivery. °CAUSES  °Bacterial vaginosis is caused by an increase in harmful bacteria that are normally present in smaller amounts in the vagina. Several different kinds of bacteria can cause bacterial vaginosis. However, the reason that the condition develops is not fully understood. °RISK FACTORS °Certain activities or behaviors can put you at an increased risk of developing bacterial vaginosis, including: °· Having a new sex partner or multiple sex partners. °· Douching. °· Using an intrauterine device (IUD) for contraception. °Women do not get bacterial vaginosis from toilet seats, bedding, swimming pools, or contact with objects around them. °SIGNS AND SYMPTOMS  °Some women with bacterial vaginosis have no signs or symptoms. Common symptoms include: °· Grey vaginal discharge. °· A fishlike odor with discharge, especially after sexual intercourse. °· Itching or burning of the vagina and vulva. °· Burning or pain with urination. °DIAGNOSIS  °Your health care provider will take a medical history and examine the vagina for signs of bacterial vaginosis. A sample of vaginal fluid may be taken. Your health care provider will look at this sample under a microscope to check for bacteria and abnormal cells. A vaginal pH test may also be done.  °TREATMENT  °Bacterial vaginosis may be treated with antibiotic medicines. These may be given in the form of a pill or a vaginal cream. A second round of antibiotics may be prescribed if the condition comes back after treatment.  °HOME CARE INSTRUCTIONS  °· Only take over-the-counter or prescription medicines as  directed by your health care provider. °· If antibiotic medicine was prescribed, take it as directed. Make sure you finish it even if you start to feel better. °· Do not have sex until treatment is completed. °· Tell all sexual partners that you have a vaginal infection. They should see their health care provider and be treated if they have problems, such as a mild rash or itching. °· Practice safe sex by using condoms and only having one sex partner. °SEEK MEDICAL CARE IF:  °· Your symptoms are not improving after 3 days of treatment. °· You have increased discharge or pain. °· You have a fever. °MAKE SURE YOU:  °· Understand these instructions. °· Will watch your condition. °· Will get help right away if you are not doing well or get worse. °FOR MORE INFORMATION  °Centers for Disease Control and Prevention, Division of STD Prevention: www.cdc.gov/std °American Sexual Health Association (ASHA): www.ashastd.org  °Document Released: 10/23/2005 Document Revised: 08/13/2013 Document Reviewed: 06/04/2013 °ExitCare® Patient Information ©2015 [employer]. This information is not intended to replace advice given to you by your health care provider. Make sure you discuss any questions you have with your health care provider. ° °Urinary Tract Infection °A urinary tract infection (UTI) can occur any place along the urinary tract. The tract includes the kidneys, ureters, bladder, and urethra. A type of germ called bacteria often causes a UTI. UTIs are often helped with antibiotic medicine.  °HOME CARE  °· If given, take antibiotics as told by your doctor. Finish them even if you start to feel better. °· Drink enough fluids to keep your pee (urine) clear or pale yellow. °· Avoid tea, drinks with caffeine,   and bubbly (carbonated) drinks. °· Pee often. Avoid holding your pee in for a long time. °· Pee before and after having sex (intercourse). °· Wipe from front to back after you poop (bowel movement) if you are a woman. Use  each tissue only once. °GET HELP RIGHT AWAY IF:  °· You have back pain. °· You have lower belly (abdominal) pain. °· You have chills. °· You feel sick to your stomach (nauseous). °· You throw up (vomit). °· Your burning or discomfort with peeing does not go away. °· You have a fever. °· Your symptoms are not better in 3 days. °MAKE SURE YOU:  °· Understand these instructions. °· Will watch your condition. °· Will get help right away if you are not doing well or get worse. °Document Released: 04/10/2008 Document Revised: 07/17/2012 Document Reviewed: 05/23/2012 °ExitCare® Patient Information ©2015 [employer]. This information is not intended to replace advice given to you by your health care provider. Make sure you discuss any questions you have with your health care provider. ° °

## 2015-04-15 NOTE — MAU Note (Signed)
Pt stated she had a period that stared on 5/9 and she said she bled for about 2 weeks. Then she started spotting for the past 2 days. Reports some occasional sharp pains and cramping.

## 2015-04-15 NOTE — MAU Provider Note (Signed)
History     CSN: [id number]  Arrival date and time: 04/15/15 1230   First Provider Initiated Contact with Patient 04/15/15 1454      Chief Complaint  Patient presents with  . Vaginal Bleeding   HPI  [name] is a 24 y.o. [phone] who presents to MAU today with complaint of spotting x 2 days. The patient states LMP 03/15/15. She states that bleeding was heavy x 17 days. She states bleeding stopped and then she began spotting lightly x 2 days. She is unsure if blood is vaginal or urethral. She also states intermittent associated sharp pain in the lower abdomen bilaterally and low back. She denies flank pain, fever, UTI symptoms, vomiting or diarrhea. She states mild occasional nausea and constipation, although last BM was normal yesterday.   OB History    Gravida Para Term Preterm AB TAB SAB Ectopic Multiple Living   3 3 3       0 3      Past Medical History  Diagnosis Date  . Chlamydia   . Gonorrhea   . Depression   . Pyelonephritis     X 2  . Eczema     Past Surgical History  Procedure Laterality Date  . No past surgeries      Family History  Problem Relation Age of Onset  . Birth defects Neg Hx   . Diabetes Neg Hx   . Stroke Neg Hx   . Hypertension Neg Hx   . Anesthesia problems Neg Hx   . Hypotension Neg Hx   . Malignant hyperthermia Neg Hx   . Pseudochol deficiency Neg Hx   . Asthma Daughter     History  Substance Use Topics  . Smoking status: Current Every Day Smoker -- 0.50 packs/day    Types: Cigarettes  . Smokeless tobacco: Never Used  . Alcohol Use: Yes     Comment: occas.    Allergies:  Allergies  Allergen Reactions  . Latex Itching and Swelling    No prescriptions prior to admission    Review of Systems  Constitutional: Negative for fever and malaise/fatigue.  Gastrointestinal: Positive for nausea and abdominal pain. Negative for vomiting, diarrhea and constipation.  Genitourinary: Negative for dysuria, urgency, frequency and  flank pain.       + vaginal bleeding, discharge   Physical Exam   Blood pressure 113/51, pulse 70, temperature 97.8 F (36.6 C), temperature source Oral, resp. rate 18, height 5' 2.75" (1.594 m), weight 137 lb (62.143 kg), last menstrual period 03/15/2015, SpO2 100 %, unknown if currently breastfeeding.  Physical Exam  Nursing note and vitals reviewed. Constitutional: She is oriented to person, place, and time. She appears well-developed and well-nourished. No distress.  HENT:  Head: Normocephalic and atraumatic.  Cardiovascular: Normal rate.   Respiratory: Effort normal.  GI: Soft. She exhibits no distension and no mass. There is tenderness (mild tenderness to palpation of the lower abdomen bilaterally). There is no rebound, no guarding and no CVA tenderness.  Genitourinary: Uterus is not enlarged and not tender. Cervix exhibits no motion tenderness, no discharge and no friability. Right adnexum displays no mass and no tenderness. Left adnexum displays no mass and no tenderness. No bleeding in the vagina. Vaginal discharge (small amount of thin, white discharge noted) found.  Neurological: She is alert and oriented to person, place, and time.  Skin: Skin is warm and dry. No erythema.  Psychiatric: She has a normal mood and affect.    Results  for orders placed or performed during the hospital encounter of 04/15/15 (from the past 24 hour(s))  Urinalysis, Routine w reflex microscopic (not at [facility])     Status: Abnormal   Collection Time: 04/15/15  1:30 PM  Result Value Ref Range   Color, Urine YELLOW YELLOW   APPearance HAZY (A) CLEAR   Specific Gravity, Urine 1.025 1.005 - 1.030   pH 6.0 5.0 - 8.0   Glucose, UA NEGATIVE NEGATIVE mg/dL   Hgb urine dipstick NEGATIVE NEGATIVE   Bilirubin Urine NEGATIVE NEGATIVE   Ketones, ur NEGATIVE NEGATIVE mg/dL   Protein, ur NEGATIVE NEGATIVE mg/dL   Urobilinogen, UA 0.2 0.0 - 1.0 mg/dL   Nitrite POSITIVE (A) NEGATIVE   Leukocytes, UA TRACE (A)  NEGATIVE  Urine microscopic-add on     Status: Abnormal   Collection Time: 04/15/15  1:30 PM  Result Value Ref Range   Squamous Epithelial / LPF RARE RARE   WBC, UA 3-6 <3 WBC/hpf   Bacteria, UA MANY (A) RARE  Pregnancy, urine POC     Status: None   Collection Time: 04/15/15  1:38 PM  Result Value Ref Range   Preg Test, Ur NEGATIVE NEGATIVE  Wet prep, genital     Status: Abnormal   Collection Time: 04/15/15  3:05 PM  Result Value Ref Range   Yeast Wet Prep HPF POC NONE SEEN NONE SEEN   Trich, Wet Prep NONE SEEN NONE SEEN   Clue Cells Wet Prep HPF POC MANY (A) NONE SEEN   WBC, Wet Prep HPF POC FEW (A) NONE SEEN    MAU Course  Procedures  MDM UPT - negative UA, wet prep, GC/Chlamydia today Declines HIV, RPR today  Assessment and Plan  A: UTI  Bacterial vaginosis Abnormal uterine bleeding  P: Discharge home Rx for Bactrim and Flagyl given to patient Warning signs for pyelonephritis discussed Patient advised to follow-up with Dr. Gaynell Face as scheduled for routine GYN care and AUB Patient may return to MAU as needed or if her condition were to change or worsen  Marny Lowenstein, PA-C  04/15/2015, 4:32 PM

## 2015-04-16 LAB — GC/CHLAMYDIA PROBE AMP (~~LOC~~) NOT AT ARMC
Chlamydia: NEGATIVE
NEISSERIA GONORRHEA: NEGATIVE

## 2015-04-28 ENCOUNTER — Telehealth: Payer: Self-pay | Admitting: Physician Assistant

## 2015-04-28 NOTE — Telephone Encounter (Signed)
Pt called stating her medicine prescribed on 6/9 is making her sick and she needs new rx because it is not working.   She was given Bactrim DS to use x 7 days and Metronidazole to use x 7 days.   She states she is still taking both of these medications 13 days later because she doesn't feel well when she uses.  When asked the specific problem, she states it causes her to lose her appetite and she hasn't eaten in 3 days as a result.  She should have finished both medications nearly a week ago if she had used as directed.  Discussed that antibiotics do not work if not used as directed.  Due to extended time period from visit, pt will need to be re-evaluated should she continue to feel unwell.

## 2015-07-15 ENCOUNTER — Encounter (HOSPITAL_COMMUNITY): Payer: Self-pay | Admitting: *Deleted

## 2015-07-15 ENCOUNTER — Inpatient Hospital Stay (HOSPITAL_COMMUNITY)
Admission: AD | Admit: 2015-07-15 | Discharge: 2015-07-15 | Disposition: A | Discharge status: Home / Self Care | Payer: Medicaid Other | Source: Ambulatory Visit | Attending: Family Medicine | Admitting: Family Medicine

## 2015-07-15 DIAGNOSIS — F17210 Nicotine dependence, cigarettes, uncomplicated: Secondary | ICD-10-CM | POA: Diagnosis not present

## 2015-07-15 DIAGNOSIS — N760 Acute vaginitis: Secondary | ICD-10-CM | POA: Insufficient documentation

## 2015-07-15 DIAGNOSIS — N923 Ovulation bleeding: Secondary | ICD-10-CM

## 2015-07-15 DIAGNOSIS — R300 Dysuria: Secondary | ICD-10-CM | POA: Insufficient documentation

## 2015-07-15 DIAGNOSIS — N390 Urinary tract infection, site not specified: Secondary | ICD-10-CM

## 2015-07-15 DIAGNOSIS — B9689 Other specified bacterial agents as the cause of diseases classified elsewhere: Secondary | ICD-10-CM

## 2015-07-15 LAB — CBC
HCT: 40.3 % (ref 36.0–46.0)
Hemoglobin: 13.3 g/dL (ref 12.0–15.0)
MCH: 28.3 pg (ref 26.0–34.0)
MCHC: 33 g/dL (ref 30.0–36.0)
MCV: 85.7 fL (ref 78.0–100.0)
PLATELETS: 235 10*3/uL (ref 150–400)
RBC: 4.7 MIL/uL (ref 3.87–5.11)
RDW: 14.2 % (ref 11.5–15.5)
WBC: 7.5 10*3/uL (ref 4.0–10.5)

## 2015-07-15 LAB — URINE MICROSCOPIC-ADD ON

## 2015-07-15 LAB — URINALYSIS, ROUTINE W REFLEX MICROSCOPIC
Bilirubin Urine: NEGATIVE
Glucose, UA: NEGATIVE mg/dL
Hgb urine dipstick: NEGATIVE
KETONES UR: NEGATIVE mg/dL
NITRITE: POSITIVE — AB
Protein, ur: NEGATIVE mg/dL
Specific Gravity, Urine: 1.025 (ref 1.005–1.030)
UROBILINOGEN UA: 1 mg/dL (ref 0.0–1.0)
pH: 6 (ref 5.0–8.0)

## 2015-07-15 LAB — WET PREP, GENITAL
TRICH WET PREP: NONE SEEN
Yeast Wet Prep HPF POC: NONE SEEN

## 2015-07-15 LAB — POCT PREGNANCY, URINE: PREG TEST UR: NEGATIVE

## 2015-07-15 MED ORDER — METRONIDAZOLE 500 MG PO TABS: 500.0000 mg | ORAL_TABLET | Freq: Two times a day (BID) | ORAL | Status: DC

## 2015-07-15 MED ORDER — METRONIDAZOLE 0.75 % VA GEL: 1.0000 | Freq: Every day | VAGINAL | Status: DC

## 2015-07-15 MED ORDER — CEPHALEXIN 500 MG PO CAPS: 500.0000 mg | ORAL_CAPSULE | Freq: Four times a day (QID) | ORAL | Status: DC

## 2015-07-15 NOTE — MAU Provider Note (Signed)
Chief Complaint: Dysuria and Vaginal Bleeding   First Provider Initiated Contact with Patient 07/15/15 1349      SUBJECTIVE HPI: Theresa [name] is a 24 y.o. [id number] who presents to maternity admissions reporting pain with urination, irregular vaginal spotting, and urine and vaginal discharge with odor.  All of these symptoms started within the last week.  She has not tried anything to resolve her symptoms.  She reports frequent UTIs and plans to follow up with Dr Gaynell Face now that she has insurance.  She desires STD testing today.  She denies vaginal itching/burning, h/a, dizziness, n/v, or fever/chills.     Dysuria  This is a new problem. The current episode started in the past 7 days. The problem occurs every urination. The problem has been unchanged. The quality of the pain is described as burning. The pain is moderate. There has been no fever. She is sexually active. There is no history of pyelonephritis. Associated symptoms include a discharge. Pertinent negatives include no chills, flank pain, frequency, nausea, urgency or vomiting. She has tried nothing for the symptoms. Her past medical history is significant for recurrent UTIs.  Vaginal Bleeding The patient's primary symptoms include vaginal bleeding and vaginal discharge. The patient's pertinent negatives include no pelvic pain. This is a new problem. The current episode started in the past 7 days. The problem occurs intermittently. The problem has been waxing and waning. The pain is mild. She is not pregnant. Associated symptoms include dysuria. Pertinent negatives include no chills, constipation, diarrhea, fever, flank pain, frequency, headaches, nausea, painful intercourse, urgency or vomiting. The vaginal discharge was malodorous, thin and white. The vaginal bleeding is spotting. She has not been passing clots. She has not been passing tissue. She has tried nothing for the symptoms. It is unknown whether or not her partner has an STD.     Past Medical History  Diagnosis Date  . Chlamydia   . Gonorrhea   . Depression   . Pyelonephritis     X 2  . Eczema    Past Surgical History  Procedure Laterality Date  . No past surgeries     Social History   Social History  . Marital Status: Single    Spouse Name: N/A  . Number of Children: N/A  . Years of Education: N/A   Occupational History  . Not on file.   Social History Main Topics  . Smoking status: Current Every Day Smoker -- 0.50 packs/day    Types: Cigarettes  . Smokeless tobacco: Never Used  . Alcohol Use: No     Comment: occas.  . Drug Use: No     Comment: last use one week ago  . Sexual Activity: Yes    Birth Control/ Protection: Condom   Other Topics Concern  . Not on file   Social History Narrative   No current facility-administered medications on file prior to encounter.   No current outpatient prescriptions on file prior to encounter.   Allergies  Allergen Reactions  . Latex Itching and Swelling    ROS:  Review of Systems  Constitutional: Negative for fever, chills and fatigue.  HENT: Negative for sinus pressure.   Eyes: Negative for photophobia.  Respiratory: Negative for shortness of breath.   Cardiovascular: Negative for chest pain.  Gastrointestinal: Negative for nausea, vomiting, diarrhea and constipation.  Genitourinary: Positive for dysuria, vaginal bleeding and vaginal discharge. Negative for urgency, frequency, flank pain, difficulty urinating, vaginal pain and pelvic pain.  Musculoskeletal: Negative for neck pain.  Neurological: Negative for dizziness, weakness and headaches.  Psychiatric/Behavioral: Negative.      I have reviewed patient's Past Medical Hx, Surgical Hx, Family Hx, Social Hx, medications and allergies.   Physical Exam   Patient Vitals for the past 24 hrs:  BP Temp Pulse Resp Height Weight  07/15/15 1454 122/83 mmHg - 90 18 - -  07/15/15 1301 118/71 mmHg 98.6 F (37 C) 107 18 5' (1.524 m) 59.603  kg (131 lb 6.4 oz)   Constitutional: Well-developed, well-nourished female in no acute distress.  Cardiovascular: normal rate Respiratory: normal effort GI: Abd soft, non-tender. Pos BS x 4 MS: Extremities nontender, no edema, normal ROM Neurologic: Alert and oriented x 4.  GU: Neg CVAT.  PELVIC EXAM: Cervix pink, visually closed, without lesion, large amount thick grey malodorous discharge, vaginal walls and external genitalia normal Bimanual exam: Cervix 0/long/high, firm, anterior, neg CMT, uterus nontender, nonenlarged, adnexa without tenderness, enlargement, or mass   LAB RESULTS Results for orders placed or performed during the hospital encounter of 07/15/15 (from the past 24 hour(s))  Urinalysis, Routine w reflex microscopic (not at [facility])     Status: Abnormal   Collection Time: 07/15/15  1:05 PM  Result Value Ref Range   Color, Urine YELLOW YELLOW   APPearance HAZY (A) CLEAR   Specific Gravity, Urine 1.025 1.005 - 1.030   pH 6.0 5.0 - 8.0   Glucose, UA NEGATIVE NEGATIVE mg/dL   Hgb urine dipstick NEGATIVE NEGATIVE   Bilirubin Urine NEGATIVE NEGATIVE   Ketones, ur NEGATIVE NEGATIVE mg/dL   Protein, ur NEGATIVE NEGATIVE mg/dL   Urobilinogen, UA 1.0 0.0 - 1.0 mg/dL   Nitrite POSITIVE (A) NEGATIVE   Leukocytes, UA TRACE (A) NEGATIVE  Urine microscopic-add on     Status: Abnormal   Collection Time: 07/15/15  1:05 PM  Result Value Ref Range   Squamous Epithelial / LPF FEW (A) RARE   WBC, UA 3-6 <3 WBC/hpf   Bacteria, UA MANY (A) RARE  Pregnancy, urine POC     Status: None   Collection Time: 07/15/15  1:08 PM  Result Value Ref Range   Preg Test, Ur NEGATIVE NEGATIVE  Wet prep, genital     Status: Abnormal   Collection Time: 07/15/15  1:52 PM  Result Value Ref Range   Yeast Wet Prep HPF POC NONE SEEN NONE SEEN   Trich, Wet Prep NONE SEEN NONE SEEN   Clue Cells Wet Prep HPF POC FEW (A) NONE SEEN   WBC, Wet Prep HPF POC MODERATE (A) NONE SEEN  CBC     Status: None    Collection Time: 07/15/15  2:00 PM  Result Value Ref Range   WBC 7.5 4.0 - 10.5 K/uL   RBC 4.70 3.87 - 5.11 MIL/uL   Hemoglobin 13.3 12.0 - 15.0 g/dL   HCT 40.1 02.7 - 25.3 %   MCV 85.7 78.0 - 100.0 fL   MCH 28.3 26.0 - 34.0 pg   MCHC 33.0 30.0 - 36.0 g/dL   RDW 66.4 40.3 - 47.4 %   Platelets 235 150 - 400 K/uL    --/--/B POS (01/27 0855)  IMAGING No results found.  MAU Management/MDM: Ordered labs and reviewed results.  Pt stable at time of discharge.  ASSESSMENT 1. UTI (lower urinary tract infection)   2. Spotting between menses   3. BV (bacterial vaginosis)     PLAN Discharge home Keflex 500 mg QID x 7 days Metrogel Q HS x 5  Follow-up Information    Follow up with Kathreen Cosier, MD.   Specialty:  Obstetrics and Gynecology   Why:  As scheduled   Contact information:   [address] [place] [address] [phone]       Follow up with [facility] [place] [facility].   Why:  As needed for emergencies   Contact information:   [address] [id number] mc [place] [address] [phone]      Sharen Counter Certified Nurse-Midwife 07/15/2015  2:57 PM

## 2015-07-15 NOTE — Discharge Instructions (Signed)
Urinary Tract Infection Urinary tract infections (UTIs) can develop anywhere along your urinary tract. Your urinary tract is your body's drainage system for removing wastes and extra water. Your urinary tract includes two kidneys, two ureters, a bladder, and a urethra. Your kidneys are a pair of bean-shaped organs. Each kidney is about the size of your fist. They are located below your ribs, one on each side of your spine. CAUSES Infections are caused by microbes, which are microscopic organisms, including fungi, viruses, and bacteria. These organisms are so small that they can only be seen through a microscope. Bacteria are the microbes that most commonly cause UTIs. SYMPTOMS  Symptoms of UTIs may vary by age and gender of the patient and by the location of the infection. Symptoms in young women typically include a frequent and intense urge to urinate and a painful, burning feeling in the bladder or urethra during urination. Older women and men are more likely to be tired, shaky, and weak and have muscle aches and abdominal pain. A fever may mean the infection is in your kidneys. Other symptoms of a kidney infection include pain in your back or sides below the ribs, nausea, and vomiting. DIAGNOSIS To diagnose a UTI, your caregiver will ask you about your symptoms. Your caregiver also will ask to provide a urine sample. The urine sample will be tested for bacteria and white blood cells. White blood cells are made by your body to help fight infection. TREATMENT  Typically, UTIs can be treated with medication. Because most UTIs are caused by a bacterial infection, they usually can be treated with the use of antibiotics. The choice of antibiotic and length of treatment depend on your symptoms and the type of bacteria causing your infection. HOME CARE INSTRUCTIONS  If you were prescribed antibiotics, take them exactly as your caregiver instructs you. Finish the medication even if you feel better after you  have only taken some of the medication.  Drink enough water and fluids to keep your urine clear or pale yellow.  Avoid caffeine, tea, and carbonated beverages. They tend to irritate your bladder.  Empty your bladder often. Avoid holding urine for long periods of time.  Empty your bladder before and after sexual intercourse.  After a bowel movement, women should cleanse from front to back. Use each tissue only once. SEEK MEDICAL CARE IF:   You have back pain.  You develop a fever.  Your symptoms do not begin to resolve within 3 days. SEEK IMMEDIATE MEDICAL CARE IF:   You have severe back pain or lower abdominal pain.  You develop chills.  You have nausea or vomiting.  You have continued burning or discomfort with urination. MAKE SURE YOU:   Understand these instructions.  Will watch your condition.  Will get help right away if you are not doing well or get worse. Document Released: 08/02/2005 Document Revised: 04/23/2012 Document Reviewed: 12/01/2011 ExitCare Patient Information 2015 [employer]. This information is not intended to replace advice given to you by your health care provider. Make sure you discuss any questions you have with your health care provider.  Bacterial Vaginosis Bacterial vaginosis is a vaginal infection that occurs when the normal balance of bacteria in the vagina is disrupted. It results from an overgrowth of certain bacteria. This is the most common vaginal infection in women of childbearing age. Treatment is important to prevent complications, especially in pregnant women, as it can cause a premature delivery. CAUSES  Bacterial vaginosis is caused by an   increase in harmful bacteria that are normally present in smaller amounts in the vagina. Several different kinds of bacteria can cause bacterial vaginosis. However, the reason that the condition develops is not fully understood. RISK FACTORS Certain activities or behaviors can put you at an  increased risk of developing bacterial vaginosis, including:  Having a new sex partner or multiple sex partners.  Douching.  Using an intrauterine device (IUD) for contraception. Women do not get bacterial vaginosis from toilet seats, bedding, swimming pools, or contact with objects around them. SIGNS AND SYMPTOMS  Some women with bacterial vaginosis have no signs or symptoms. Common symptoms include:  Grey vaginal discharge.  A fishlike odor with discharge, especially after sexual intercourse.  Itching or burning of the vagina and vulva.  Burning or pain with urination. DIAGNOSIS  Your health care provider will take a medical history and examine the vagina for signs of bacterial vaginosis. A sample of vaginal fluid may be taken. Your health care provider will look at this sample under a microscope to check for bacteria and abnormal cells. A vaginal pH test may also be done.  TREATMENT  Bacterial vaginosis may be treated with antibiotic medicines. These may be given in the form of a pill or a vaginal cream. A second round of antibiotics may be prescribed if the condition comes back after treatment.  HOME CARE INSTRUCTIONS   Only take over-the-counter or prescription medicines as directed by your health care provider.  If antibiotic medicine was prescribed, take it as directed. Make sure you finish it even if you start to feel better.  Do not have sex until treatment is completed.  Tell all sexual partners that you have a vaginal infection. They should see their health care provider and be treated if they have problems, such as a mild rash or itching.  Practice safe sex by using condoms and only having one sex partner. SEEK MEDICAL CARE IF:   Your symptoms are not improving after 3 days of treatment.  You have increased discharge or pain.  You have a fever. MAKE SURE YOU:   Understand these instructions.  Will watch your condition.  Will get help right away if you are not  doing well or get worse. FOR MORE INFORMATION  Centers for Disease Control and Prevention, Division of STD Prevention: www.cdc.gov/std American Sexual Health Association (ASHA): www.ashastd.org  Document Released: 10/23/2005 Document Revised: 08/13/2013 Document Reviewed: 06/04/2013 ExitCare Patient Information 2015 [employer]. This information is not intended to replace advice given to you by your health care provider. Make sure you discuss any questions you have with your health care provider.  

## 2015-07-15 NOTE — MAU Note (Signed)
Pt stated she has been hving sme spotting and strong smelling urine. C/o sharp pain also in her pelvis

## 2015-07-16 LAB — GC/CHLAMYDIA PROBE AMP (~~LOC~~) NOT AT ARMC
CHLAMYDIA, DNA PROBE: NEGATIVE
NEISSERIA GONORRHEA: NEGATIVE

## 2015-07-16 LAB — HIV ANTIBODY (ROUTINE TESTING W REFLEX): HIV Screen 4th Generation wRfx: NONREACTIVE

## 2015-07-17 LAB — URINE CULTURE

## 2015-10-19 LAB — OB RESULTS CONSOLE GC/CHLAMYDIA
Chlamydia: NEGATIVE
Gonorrhea: NEGATIVE

## 2015-10-19 LAB — CYTOLOGY - PAP: Pap: NEGATIVE

## 2015-10-20 ENCOUNTER — Other Ambulatory Visit (HOSPITAL_COMMUNITY): Payer: Self-pay | Admitting: Obstetrics

## 2015-10-20 DIAGNOSIS — O3680X1 Pregnancy with inconclusive fetal viability, fetus 1: Secondary | ICD-10-CM

## 2015-10-27 ENCOUNTER — Ambulatory Visit (HOSPITAL_COMMUNITY)
Admission: RE | Admit: 2015-10-27 | Discharge: 2015-10-27 | Disposition: A | Discharge status: Home / Self Care | Payer: Medicaid Other | Source: Ambulatory Visit | Attending: Obstetrics | Admitting: Obstetrics

## 2015-10-27 ENCOUNTER — Encounter (HOSPITAL_COMMUNITY): Payer: Self-pay

## 2015-10-27 DIAGNOSIS — Z36 Encounter for antenatal screening of mother: Secondary | ICD-10-CM | POA: Diagnosis not present

## 2015-10-27 DIAGNOSIS — O365910 Maternal care for other known or suspected poor fetal growth, first trimester, not applicable or unspecified: Secondary | ICD-10-CM | POA: Diagnosis present

## 2015-10-27 DIAGNOSIS — Z3A08 8 weeks gestation of pregnancy: Secondary | ICD-10-CM | POA: Diagnosis not present

## 2015-10-27 DIAGNOSIS — O3680X1 Pregnancy with inconclusive fetal viability, fetus 1: Secondary | ICD-10-CM

## 2015-11-07 NOTE — L&D Delivery Note (Signed)
Delivery Note Patient presented with SOL. 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  AROM with thick meconium. At 4:55 AM a viable female was delivered via Vaginal, Spontaneous Delivery (Presentation:  Occiput Anterior).  APGAR: 8 ,9 ; weight-pending.   Placenta status: Intact, Spontaneous.  Cord: 3 vessels with the following complications:  None.  Cord pH: n/a  Anesthesia: Epidural  Episiotomy: None Lacerations: None Suture Repair: n/a Est. Blood Loss (mL): 200  Mom to postpartum.  Baby to Couplet care / Skin to Skin.  Theresa [name] 05/16/2016, 5:07 AM    I was gloved and present for entire delivery SVD without incident No difficulty with shoulders No lacerations  Theresa [name], CNM

## 2016-02-03 ENCOUNTER — Inpatient Hospital Stay (HOSPITAL_COMMUNITY)
Admission: AD | Admit: 2016-02-03 | Discharge: 2016-02-03 | Disposition: A | Discharge status: Home / Self Care | Payer: Medicaid Other | Source: Ambulatory Visit | Attending: Obstetrics & Gynecology | Admitting: Obstetrics & Gynecology

## 2016-02-03 ENCOUNTER — Encounter (HOSPITAL_COMMUNITY): Payer: Self-pay | Admitting: *Deleted

## 2016-02-03 DIAGNOSIS — W19XXXA Unspecified fall, initial encounter: Secondary | ICD-10-CM

## 2016-02-03 DIAGNOSIS — O4692 Antepartum hemorrhage, unspecified, second trimester: Secondary | ICD-10-CM | POA: Diagnosis not present

## 2016-02-03 DIAGNOSIS — O99332 Smoking (tobacco) complicating pregnancy, second trimester: Secondary | ICD-10-CM | POA: Insufficient documentation

## 2016-02-03 DIAGNOSIS — R109 Unspecified abdominal pain: Secondary | ICD-10-CM | POA: Diagnosis present

## 2016-02-03 DIAGNOSIS — Z3A22 22 weeks gestation of pregnancy: Secondary | ICD-10-CM

## 2016-02-03 DIAGNOSIS — O9A212 Injury, poisoning and certain other consequences of external causes complicating pregnancy, second trimester: Secondary | ICD-10-CM

## 2016-02-03 DIAGNOSIS — W109XXA Fall (on) (from) unspecified stairs and steps, initial encounter: Secondary | ICD-10-CM

## 2016-02-03 DIAGNOSIS — S3991XA Unspecified injury of abdomen, initial encounter: Secondary | ICD-10-CM

## 2016-02-03 DIAGNOSIS — F17210 Nicotine dependence, cigarettes, uncomplicated: Secondary | ICD-10-CM | POA: Insufficient documentation

## 2016-02-03 DIAGNOSIS — R102 Pelvic and perineal pain: Secondary | ICD-10-CM

## 2016-02-03 DIAGNOSIS — O26899 Other specified pregnancy related conditions, unspecified trimester: Secondary | ICD-10-CM

## 2016-02-03 LAB — URINALYSIS, ROUTINE W REFLEX MICROSCOPIC
BILIRUBIN URINE: NEGATIVE
Glucose, UA: NEGATIVE mg/dL
HGB URINE DIPSTICK: NEGATIVE
Ketones, ur: NEGATIVE mg/dL
Leukocytes, UA: NEGATIVE
Nitrite: NEGATIVE
PH: 6.5 (ref 5.0–8.0)
Protein, ur: NEGATIVE mg/dL

## 2016-02-03 NOTE — MAU Provider Note (Signed)
History     CSN: [id number]  Arrival date and time: 02/03/16 1139   First Provider Initiated Contact with Patient 02/03/16 1303      Chief Complaint  Patient presents with  . Abdominal Pain  . Fall   HPI  Theresa [name] is a 25 yo G4P3003 at 644w6d who presents with vaginal bleeding and abdominal pain x 1 day. Patient states that she fell backwards on her left side on the stairs last night around 1700. Then around 1900 she developed some left sided abdominal pain and noticed some vaginal spotting. Patient states the bleeding did not get worse throughout the night. Did not try any pain medication. Pain is described as "pain" and is located all over her abdomen. Pain is constant and does not radiate.  Denies fever, lightheadedness, dizziness, headache, blurry vision, RUQ pain, nausea, vomiting, diarrhea, constipation, dysuria, or extremity swelling.   OB History    Gravida Para Term Preterm AB TAB SAB Ectopic Multiple Living   4 3 3       0 3      Past Medical History  Diagnosis Date  . Chlamydia   . Gonorrhea   . Depression   . Pyelonephritis     X 2  . Eczema     Past Surgical History  Procedure Laterality Date  . No past surgeries      Family History  Problem Relation Age of Onset  . Birth defects Neg Hx   . Diabetes Neg Hx   . Stroke Neg Hx   . Hypertension Neg Hx   . Anesthesia problems Neg Hx   . Hypotension Neg Hx   . Malignant hyperthermia Neg Hx   . Pseudochol deficiency Neg Hx   . Asthma Daughter     Social History  Substance Use Topics  . Smoking status: Current Every Day Smoker -- 0.50 packs/day    Types: Cigarettes  . Smokeless tobacco: Never Used  . Alcohol Use: No     Comment: occas.    Allergies:  Allergies  Allergen Reactions  . Latex Itching and Swelling    Prescriptions prior to admission  Medication Sig Dispense Refill Last Dose  . Prenatal Vit-Min-FA-Fish Oil ([facility] PRENATAL GUMMY PO) Take 1 each by mouth 3 (three) times daily.    02/02/2016 at Unknown time    ROS: See HPI Physical Exam   Blood pressure 103/53, pulse 68, temperature 98.2 F (36.8 C), temperature source Oral, resp. rate 18, height 5\' 1"  (1.549 m), weight 66.497 kg (146 lb 9.6 oz), last menstrual period 07/08/2015, unknown if currently breastfeeding.  Physical Exam  Constitutional: She is oriented to person, place, and time. She appears well-developed and well-nourished.  HENT:  Head: Normocephalic and atraumatic.  Neck: Normal range of motion.  Cardiovascular: Normal rate and intact distal pulses.   Respiratory: Effort normal. No respiratory distress.  GI: Bowel sounds are normal. She exhibits distension (gravid abdomen). There is tenderness (tenderness to deep palpation of all four quadrants).  Musculoskeletal: Normal range of motion. She exhibits no edema.  Neurological: She is alert and oriented to person, place, and time.  Skin: Skin is warm and dry. No rash noted.  Psychiatric: She has a normal mood and affect. Her behavior is normal. Judgment and thought content normal.   GYN:  External genitalia within normal limits.  Vaginal mucosa pink, moist, normal rugae.  Nonfriable cervix without lesions, no discharge or bleeding noted on speculum exam.    MAU Course  Procedures  MDM Patient presented to MAU with complaints of abdominal pain and vaginal bleeding in the setting of falling backward on her left side on the stairs. FHT were reactive and no UC on monitor. Speculum exam performed showing no vaginal bleeding or discharge. Abdominal exam benign.   Assessment and Plan  Theresa [name] is a 25 yo G4P3003 at [redacted]w[redacted]d who complains of abdominal pain and vaginal bleeding x 1 day in the setting of falling yesterday.   Abdominal Pain: Likely due to MSK pain, patient politely declined any Tylenol or Flexeril as she does not like medications. Unlikely pre-term labor as there are no contractions and no ROM. Speculum exam WNL. No signs of acute abdomen.  -  Return precautions given - Tylenol PRN suggested if patient is agreeable  Vaginal Bleeding: FHT reactive, no contractions, speculum exam WNL; unlikely miscarriage, placenta previa, placenta abruption, or preterm labor. Unlikely cervicitis or vaginitis. Bleeding could be due to some previous vaginal/cervical irritation but nothing seen on speculum exam. - Return precautions given  Theresa [name] 02/03/2016, 1:13 PM   OB fellow attestation: I have seen and examined this patient; I agree with above documentation in the Resident's note.   Theresa [name] is a 25 y.o. G4P3003 reporting back pain after fall.  +FM, denies LOF, VB, contractions, vaginal discharge.  PE: BP 103/53 mmHg  Pulse 68  Temp(Src) 98.2 F (36.8 C) (Oral)  Resp 18  Ht  (1.549 m)  Wt 146 lb 9.6 oz (66.497 kg)  BMI 27.71 kg/m2  LMP 07/08/2015 Gen: calm comfortable, NAD Resp: normal effort, no distress Abd: gravid  ROS, labs, PMH reviewed NST reactive - despite GA  Plan: Discharge home with return precautions Prn medication declined by patient Encouraged to keep appt at [facility], MD 3:51 PM

## 2016-02-03 NOTE — MAU Note (Signed)
Pt stated she fell yesterday landed on her left side. Started having pain last night and spotting since 7pm last night. Reports less than normal fetal movement

## 2016-02-03 NOTE — Discharge Instructions (Signed)
Vaginal Bleeding During Pregnancy, Second Trimester A small amount of bleeding (spotting) from the vagina is relatively common in pregnancy. It usually stops on its own. Various things can cause bleeding or spotting in pregnancy. Some bleeding may be related to the pregnancy, and some may not. Sometimes the bleeding is normal and is not a problem. However, bleeding can also be a sign of something serious. Be sure to tell your health care provider about any vaginal bleeding right away. Some possible causes of vaginal bleeding during the second trimester include:  Infection, inflammation, or growths on the cervix.   The placenta may be partially or completely covering the opening of the cervix inside the uterus (placenta previa).  The placenta may have separated from the uterus (abruption of the placenta).   You may be having early (preterm) labor.   The cervix may not be strong enough to keep a baby inside the uterus (cervical insufficiency).   Tiny cysts may have developed in the uterus instead of pregnancy tissue (molar pregnancy). HOME CARE INSTRUCTIONS  Watch your condition for any changes. The following actions may help to lessen any discomfort you are feeling:  Follow your health care provider's instructions for limiting your activity. If your health care provider orders bed rest, you may need to stay in bed and only get up to use the bathroom. However, your health care provider may allow you to continue light activity.  If needed, make plans for someone to help with your regular activities and responsibilities while you are on bed rest.  Keep track of the number of pads you use each day, how often you change pads, and how soaked (saturated) they are. Write this down.  Do not use tampons. Do not douche.  Do not have sexual intercourse or orgasms until approved by your health care provider.  If you pass any tissue from your vagina, save the tissue so you can show it to your  health care provider.  Only take over-the-counter or prescription medicines as directed by your health care provider.  Do not take aspirin because it can make you bleed.  Do not exercise or perform any strenuous activities or heavy lifting without your health care provider's permission.  Keep all follow-up appointments as directed by your health care provider. SEEK MEDICAL CARE IF:  You have any vaginal bleeding during any part of your pregnancy.  You have cramps or labor pains.  You have a fever, not controlled by medicine. SEEK IMMEDIATE MEDICAL CARE IF:   You have severe cramps in your back or belly (abdomen).  You have contractions.  You have chills.  You pass large clots or tissue from your vagina.  Your bleeding increases.  You feel light-headed or weak, or you have fainting episodes.  You are leaking fluid or have a gush of fluid from your vagina. MAKE SURE YOU:  Understand these instructions.  Will watch your condition.  Will get help right away if you are not doing well or get worse.   This information is not intended to replace advice given to you by your health care provider. Make sure you discuss any questions you have with your health care provider.   Document Released: 08/02/2005 Document Revised: 10/28/2013 Document Reviewed: 06/30/2013 Elsevier Interactive Patient Education 2016 [employer]  Vaginal Bleeding During Pregnancy, Second Trimester A small amount of bleeding (spotting) from the vagina is relatively common in pregnancy. It usually stops on its own. Various things can cause bleeding or spotting in pregnancy.  Some bleeding may be related to the pregnancy, and some may not. Sometimes the bleeding is normal and is not a problem. However, bleeding can also be a sign of something serious. Be sure to tell your health care provider about any vaginal bleeding right away. Some possible causes of vaginal bleeding during the second trimester  include:  Infection, inflammation, or growths on the cervix.   The placenta may be partially or completely covering the opening of the cervix inside the uterus (placenta previa).  The placenta may have separated from the uterus (abruption of the placenta).   You may be having early (preterm) labor.   The cervix may not be strong enough to keep a baby inside the uterus (cervical insufficiency).   Tiny cysts may have developed in the uterus instead of pregnancy tissue (molar pregnancy). HOME CARE INSTRUCTIONS  Watch your condition for any changes. The following actions may help to lessen any discomfort you are feeling:  Follow your health care provider's instructions for limiting your activity. If your health care provider orders bed rest, you may need to stay in bed and only get up to use the bathroom. However, your health care provider may allow you to continue light activity.  If needed, make plans for someone to help with your regular activities and responsibilities while you are on bed rest.  Keep track of the number of pads you use each day, how often you change pads, and how soaked (saturated) they are. Write this down.  Do not use tampons. Do not douche.  Do not have sexual intercourse or orgasms until approved by your health care provider.  If you pass any tissue from your vagina, save the tissue so you can show it to your health care provider.  Only take over-the-counter or prescription medicines as directed by your health care provider.  Do not take aspirin because it can make you bleed.  Do not exercise or perform any strenuous activities or heavy lifting without your health care provider's permission.  Keep all follow-up appointments as directed by your health care provider. SEEK MEDICAL CARE IF:  You have any vaginal bleeding during any part of your pregnancy.  You have cramps or labor pains.  You have a fever, not controlled by medicine. SEEK IMMEDIATE  MEDICAL CARE IF:   You have severe cramps in your back or belly (abdomen).  You have contractions.  You have chills.  You pass large clots or tissue from your vagina.  Your bleeding increases.  You feel light-headed or weak, or you have fainting episodes.  You are leaking fluid or have a gush of fluid from your vagina. MAKE SURE YOU:  Understand these instructions.  Will watch your condition.  Will get help right away if you are not doing well or get worse.   This information is not intended to replace advice given to you by your health care provider. Make sure you discuss any questions you have with your health care provider.   Document Released: 08/02/2005 Document Revised: 10/28/2013 Document Reviewed: 06/30/2013 Elsevier Interactive Patient Education [employer]

## 2016-03-02 LAB — CULTURE, OB URINE: URINE CULTURE, OB: NEGATIVE

## 2016-03-06 LAB — SICKLE CELL SCREEN: SICKLE CELL SCREEN: NEGATIVE

## 2016-03-06 LAB — OB RESULTS CONSOLE HGB/HCT, BLOOD
HEMATOCRIT: 32 %
HEMOGLOBIN: 10.8 g/dL

## 2016-03-06 LAB — OB RESULTS CONSOLE RPR
RPR: NONREACTIVE
RPR: NONREACTIVE

## 2016-03-06 LAB — OB RESULTS CONSOLE HEPATITIS B SURFACE ANTIGEN: Hepatitis B Surface Ag: NEGATIVE

## 2016-03-06 LAB — OB RESULTS CONSOLE RUBELLA ANTIBODY, IGM: Rubella: IMMUNE

## 2016-03-06 LAB — OB RESULTS CONSOLE PLATELET COUNT: Platelets: 209 10*3/uL

## 2016-03-06 LAB — GLUCOSE TOLERANCE, 1 HOUR: Glucose 1 Hour: 105

## 2016-03-10 ENCOUNTER — Ambulatory Visit (HOSPITAL_COMMUNITY): Payer: Medicaid Other | Attending: Nurse Practitioner

## 2016-03-15 DIAGNOSIS — O0990 Supervision of high risk pregnancy, unspecified, unspecified trimester: Secondary | ICD-10-CM

## 2016-03-15 DIAGNOSIS — F5089 Other specified eating disorder: Secondary | ICD-10-CM

## 2016-03-15 DIAGNOSIS — Z3490 Encounter for supervision of normal pregnancy, unspecified, unspecified trimester: Secondary | ICD-10-CM | POA: Insufficient documentation

## 2016-03-16 ENCOUNTER — Encounter: Payer: Self-pay | Admitting: Family Medicine

## 2016-03-16 ENCOUNTER — Encounter: Payer: Medicaid Other | Admitting: Family Medicine

## 2016-03-16 ENCOUNTER — Encounter: Payer: Self-pay | Admitting: *Deleted

## 2016-04-18 ENCOUNTER — Encounter (HOSPITAL_COMMUNITY): Payer: Self-pay

## 2016-04-18 ENCOUNTER — Inpatient Hospital Stay (HOSPITAL_COMMUNITY)
Admission: AD | Admit: 2016-04-18 | Discharge: 2016-04-18 | Disposition: A | Discharge status: Home / Self Care | Payer: Medicaid Other | Source: Ambulatory Visit | Attending: Obstetrics & Gynecology | Admitting: Obstetrics & Gynecology

## 2016-04-18 DIAGNOSIS — Z825 Family history of asthma and other chronic lower respiratory diseases: Secondary | ICD-10-CM | POA: Insufficient documentation

## 2016-04-18 DIAGNOSIS — O26893 Other specified pregnancy related conditions, third trimester: Secondary | ICD-10-CM | POA: Diagnosis present

## 2016-04-18 DIAGNOSIS — L309 Dermatitis, unspecified: Secondary | ICD-10-CM | POA: Diagnosis not present

## 2016-04-18 DIAGNOSIS — O98813 Other maternal infectious and parasitic diseases complicating pregnancy, third trimester: Secondary | ICD-10-CM

## 2016-04-18 DIAGNOSIS — F17210 Nicotine dependence, cigarettes, uncomplicated: Secondary | ICD-10-CM | POA: Diagnosis not present

## 2016-04-18 DIAGNOSIS — Z3A33 33 weeks gestation of pregnancy: Secondary | ICD-10-CM | POA: Diagnosis not present

## 2016-04-18 DIAGNOSIS — Z823 Family history of stroke: Secondary | ICD-10-CM | POA: Insufficient documentation

## 2016-04-18 DIAGNOSIS — O26853 Spotting complicating pregnancy, third trimester: Secondary | ICD-10-CM | POA: Diagnosis not present

## 2016-04-18 DIAGNOSIS — Z8249 Family history of ischemic heart disease and other diseases of the circulatory system: Secondary | ICD-10-CM | POA: Insufficient documentation

## 2016-04-18 DIAGNOSIS — F329 Major depressive disorder, single episode, unspecified: Secondary | ICD-10-CM | POA: Diagnosis not present

## 2016-04-18 DIAGNOSIS — B3731 Acute candidiasis of vulva and vagina: Secondary | ICD-10-CM | POA: Diagnosis present

## 2016-04-18 DIAGNOSIS — Z8619 Personal history of other infectious and parasitic diseases: Secondary | ICD-10-CM | POA: Insufficient documentation

## 2016-04-18 DIAGNOSIS — R109 Unspecified abdominal pain: Secondary | ICD-10-CM | POA: Diagnosis not present

## 2016-04-18 DIAGNOSIS — B9689 Other specified bacterial agents as the cause of diseases classified elsewhere: Secondary | ICD-10-CM

## 2016-04-18 DIAGNOSIS — B373 Candidiasis of vulva and vagina: Secondary | ICD-10-CM | POA: Diagnosis not present

## 2016-04-18 DIAGNOSIS — Z91040 Latex allergy status: Secondary | ICD-10-CM | POA: Diagnosis not present

## 2016-04-18 DIAGNOSIS — Z720 Tobacco use: Secondary | ICD-10-CM

## 2016-04-18 DIAGNOSIS — O09299 Supervision of pregnancy with other poor reproductive or obstetric history, unspecified trimester: Secondary | ICD-10-CM

## 2016-04-18 DIAGNOSIS — F1290 Cannabis use, unspecified, uncomplicated: Secondary | ICD-10-CM

## 2016-04-18 DIAGNOSIS — Z833 Family history of diabetes mellitus: Secondary | ICD-10-CM | POA: Insufficient documentation

## 2016-04-18 DIAGNOSIS — N949 Unspecified condition associated with female genital organs and menstrual cycle: Secondary | ICD-10-CM

## 2016-04-18 DIAGNOSIS — O26859 Spotting complicating pregnancy, unspecified trimester: Secondary | ICD-10-CM | POA: Diagnosis present

## 2016-04-18 DIAGNOSIS — N760 Acute vaginitis: Secondary | ICD-10-CM

## 2016-04-18 LAB — URINALYSIS, ROUTINE W REFLEX MICROSCOPIC
Bilirubin Urine: NEGATIVE
GLUCOSE, UA: NEGATIVE mg/dL
Hgb urine dipstick: NEGATIVE
Ketones, ur: 15 mg/dL — AB
NITRITE: NEGATIVE
PH: 6 (ref 5.0–8.0)
PROTEIN: NEGATIVE mg/dL
Specific Gravity, Urine: >1.03 — ABNORMAL HIGH (ref 1.005–1.030)

## 2016-04-18 LAB — WET PREP, GENITAL
SPERM: NONE SEEN
Trich, Wet Prep: NONE SEEN

## 2016-04-18 LAB — RAPID URINE DRUG SCREEN, HOSP PERFORMED
AMPHETAMINES: NOT DETECTED
BENZODIAZEPINES: NOT DETECTED
Barbiturates: NOT DETECTED
COCAINE: NOT DETECTED
Opiates: NOT DETECTED
Tetrahydrocannabinol: POSITIVE — AB

## 2016-04-18 LAB — URINE MICROSCOPIC-ADD ON: RBC / HPF: NONE SEEN RBC/hpf (ref 0–5)

## 2016-04-18 MED ORDER — FLUCONAZOLE 150 MG PO TABS: ORAL_TABLET | ORAL | Status: DC

## 2016-04-18 MED ORDER — METRONIDAZOLE 500 MG PO TABS
500.0000 mg | ORAL_TABLET | Freq: Two times a day (BID) | ORAL | Status: DC
Start: 2016-04-18 — End: 2016-05-18

## 2016-04-18 NOTE — Discharge Instructions (Signed)
Bacterial Vaginosis Bacterial vaginosis is a vaginal infection that occurs when the normal balance of bacteria in the vagina is disrupted. It results from an overgrowth of certain bacteria. This is the most common vaginal infection in women of childbearing age. Treatment is important to prevent complications, especially in pregnant women, as it can cause a premature delivery. CAUSES  Bacterial vaginosis is caused by an increase in harmful bacteria that are normally present in smaller amounts in the vagina. Several different kinds of bacteria can cause bacterial vaginosis. However, the reason that the condition develops is not fully understood. RISK FACTORS Certain activities or behaviors can put you at an increased risk of developing bacterial vaginosis, including:  Having a new sex partner or multiple sex partners.  Douching.  Using an intrauterine device (IUD) for contraception. Women do not get bacterial vaginosis from toilet seats, bedding, swimming pools, or contact with objects around them. SIGNS AND SYMPTOMS  Some women with bacterial vaginosis have no signs or symptoms. Common symptoms include:  Grey vaginal discharge.  A fishlike odor with discharge, especially after sexual intercourse.  Itching or burning of the vagina and vulva.  Burning or pain with urination. DIAGNOSIS  Your health care provider will take a medical history and examine the vagina for signs of bacterial vaginosis. A sample of vaginal fluid may be taken. Your health care provider will look at this sample under a microscope to check for bacteria and abnormal cells. A vaginal pH test may also be done.  TREATMENT  Bacterial vaginosis may be treated with antibiotic medicines. These may be given in the form of a pill or a vaginal cream. A second round of antibiotics may be prescribed if the condition comes back after treatment. Because bacterial vaginosis increases your risk for sexually transmitted diseases, getting  treated can help reduce your risk for chlamydia, gonorrhea, HIV, and herpes. HOME CARE INSTRUCTIONS   Only take over-the-counter or prescription medicines as directed by your health care provider.  If antibiotic medicine was prescribed, take it as directed. Make sure you finish it even if you start to feel better.  Tell all sexual partners that you have a vaginal infection. They should see their health care provider and be treated if they have problems, such as a mild rash or itching.  During treatment, it is important that you follow these instructions:  Avoid sexual activity or use condoms correctly.  Do not douche.  Avoid alcohol as directed by your health care provider.  Avoid breastfeeding as directed by your health care provider. SEEK MEDICAL CARE IF:   Your symptoms are not improving after 3 days of treatment.  You have increased discharge or pain.  You have a fever. MAKE SURE YOU:   Understand these instructions.  Will watch your condition.  Will get help right away if you are not doing well or get worse. FOR MORE INFORMATION  Centers for Disease Control and Prevention, Division of STD Prevention: www.cdc.gov/std American Sexual Health Association (ASHA): www.ashastd.org    This information is not intended to replace advice given to you by your health care provider. Make sure you discuss any questions you have with your health care provider.   Document Released: 10/23/2005 Document Revised: 11/13/2014 Document Reviewed: 06/04/2013 Elsevier Interactive Patient Education 2016 Elsevier Inc. Monilial Vaginitis Vaginitis in a soreness, swelling and redness (inflammation) of the vagina and vulva. Monilial vaginitis is not a sexually transmitted infection. CAUSES  Yeast vaginitis is caused by yeast (candida) that is normally found in   your vagina. With a yeast infection, the candida has overgrown in number to a point that upsets the chemical balance. SYMPTOMS   White,  thick vaginal discharge.  Swelling, itching, redness and irritation of the vagina and possibly the lips of the vagina (vulva).  Burning or painful urination.  Painful intercourse. DIAGNOSIS  Things that may contribute to monilial vaginitis are:  Postmenopausal and virginal states.  Pregnancy.  Infections.  Being tired, sick or stressed, especially if you had monilial vaginitis in the past.  Diabetes. Good control will help lower the chance.  Birth control pills.  Tight fitting garments.  Using bubble bath, feminine sprays, douches or deodorant tampons.  Taking certain medications that kill germs (antibiotics).  Sporadic recurrence can occur if you become ill. TREATMENT  Your caregiver will give you medication.  There are several kinds of anti monilial vaginal creams and suppositories specific for monilial vaginitis. For recurrent yeast infections, use a suppository or cream in the vagina 2 times a week, or as directed.  Anti-monilial or steroid cream for the itching or irritation of the vulva may also be used. Get your caregiver's permission.  Painting the vagina with methylene blue solution may help if the monilial cream does not work.  Eating yogurt may help prevent monilial vaginitis. HOME CARE INSTRUCTIONS   Finish all medication as prescribed.  Do not have sex until treatment is completed or after your caregiver tells you it is okay.  Take warm sitz baths.  Do not douche.  Do not use tampons, especially scented ones.  Wear cotton underwear.  Avoid tight pants and panty hose.  Tell your sexual partner that you have a yeast infection. They should go to their caregiver if they have symptoms such as mild rash or itching.  Your sexual partner should be treated as well if your infection is difficult to eliminate.  Practice safer sex. Use condoms.  Some vaginal medications cause latex condoms to fail. Vaginal medications that harm condoms are:  Cleocin  cream.  Butoconazole (Femstat).  Terconazole (Terazol) vaginal suppository.  Miconazole (Monistat) (may be purchased over the counter). SEEK MEDICAL CARE IF:   You have a temperature by mouth above 102 F (38.9 C).  The infection is getting worse after 2 days of treatment.  The infection is not getting better after 3 days of treatment.  You develop blisters in or around your vagina.  You develop vaginal bleeding, and it is not your menstrual period.  You have pain when you urinate.  You develop intestinal problems.  You have pain with sexual intercourse.   This information is not intended to replace advice given to you by your health care provider. Make sure you discuss any questions you have with your health care provider.   Document Released: 08/02/2005 Document Revised: 01/15/2012 Document Reviewed: 04/26/2015 Elsevier Interactive Patient Education 2016 Elsevier Inc.  

## 2016-04-18 NOTE — MAU Note (Signed)
Urine sent to lab 

## 2016-04-18 NOTE — MAU Provider Note (Signed)
History     CSN: [id number]  Arrival date and time: 04/18/16 9562   First Provider Initiated Contact with Patient 04/18/16 1933      Chief Complaint  Patient presents with  . Abdominal Pain   HPI Comments: Theresa [name] is a 25 y.o. G4P3003 at [redacted]w[redacted]d presenting with sharp fleeting abdominal pains especially when lying on her side and moving. Also reports spotting.   Abdominal Pain This is a recurrent problem. The current episode started in the past 7 days. The onset quality is undetermined. The problem occurs intermittently. The problem has been unchanged. The pain is located in the suprapubic region, RLQ and LLQ. The pain is at a severity of 5/10. The pain is moderate. The quality of the pain is sharp. The abdominal pain radiates to the LUQ and RUQ. Pertinent negatives include no anorexia, arthralgias, diarrhea, dysuria, fever, nausea or vomiting. Nothing aggravates the pain. The pain is relieved by nothing. She has tried nothing for the symptoms.  Vaginal Bleeding The patient's primary symptoms include vaginal bleeding. The patient's pertinent negatives include no genital itching, genital lesions or genital odor. This is a new problem. The current episode started in the past 7 days. The problem occurs rarely. The pain is moderate. The problem affects both sides. Associated symptoms include abdominal pain. Pertinent negatives include no anorexia, diarrhea, dysuria, fever, nausea or vomiting. The vaginal bleeding is spotting (light spotting x 3 days, not postcoital). She has not been passing clots. She has not been passing tissue. Nothing aggravates the symptoms. She has tried nothing for the symptoms.   ZHY:QMVHQION care with MAU viists, 2 visits at [facility] and appt at [facility] 05/04/16. Admits tobacco but not marijuana. OB History  Gravida Para Term Preterm AB SAB TAB Ectopic Multiple Living  4 3 3       0 3    # Outcome Date GA Lbr Len/2nd Weight Sex Delivery Anes PTL Lv  4 Current           3  Term 12/02/14 [redacted]w[redacted]d 05:36 / 00:12 2.421 kg (5 lb 5.4 oz) M Vag-Spont EPI  Y     CommentsMayford Knife Syndrome  2 Term 02/20/12 [redacted]w[redacted]d 16:37 / 01:09 3.335 kg (7 lb 5.6 oz) M Vag-Spont EPI  Y  1 Term 09/28/09 [redacted]w[redacted]d  3.175 kg (7 lb) F Vag-Spont EPI N Y       Past Medical History  Diagnosis Date  . Chlamydia   . Gonorrhea   . Depression   . Pyelonephritis     X 2  . Eczema     Past Surgical History  Procedure Laterality Date  . No past surgeries      Family History  Problem Relation Age of Onset  . Birth defects Neg Hx   . Diabetes Neg Hx   . Stroke Neg Hx   . Hypertension Neg Hx   . Anesthesia problems Neg Hx   . Hypotension Neg Hx   . Malignant hyperthermia Neg Hx   . Pseudochol deficiency Neg Hx   . Asthma Daughter   . Williams syndrome Son     Social History  Substance Use Topics  . Smoking status: Current Every Day Smoker -- 0.50 packs/day    Types: Cigarettes  . Smokeless tobacco: Never Used  . Alcohol Use: No     Comment: occas.    Allergies:  Allergies  Allergen Reactions  . Latex Itching and Swelling    Prescriptions prior to admission  Medication Sig Dispense  Refill Last Dose  . Prenatal Vit-Min-FA-Fish Oil ([employer] PRENATAL GUMMY PO) Take 1 each by mouth 3 (three) times daily.   02/02/2016 at Unknown time    Review of Systems  Constitutional: Negative for fever and malaise/fatigue.  Gastrointestinal: Positive for abdominal pain. Negative for nausea, vomiting, diarrhea and anorexia.  Genitourinary: Positive for vaginal bleeding. Negative for dysuria.  Musculoskeletal: Negative for arthralgias.   Physical Exam   Blood pressure 113/53, pulse 76, temperature 98.5 F (36.9 C), temperature source Oral, resp. rate 18, height 5' (1.524 m), weight 71.487 kg (157 lb 9.6 oz), last menstrual period 07/08/2015, SpO2 100 %, unknown if currently breastfeeding.  Physical Exam  Nursing note and vitals reviewed. Constitutional: She is oriented to person, place, and  time.  Cardiovascular: Normal rate.   Respiratory: Effort normal.  GI: Soft. There is tenderness. There is no guarding.  Minimal diffuse abd tenderness and tender in grooin area bilaterally FH 33 cm  Genitourinary:  Spec: copious adherent white and homogenous thinm white discharge Cx post/ long closed No blood noted on exam  Musculoskeletal: Normal range of motion.  Neurological: She is alert and oriented to person, place, and time.  EFM: Baseline 140, moderate variability,  reactive without decels Toco: no UCs, sl UI  MAU Course  Procedures Results for orders placed or performed during the hospital encounter of 04/18/16 (from the past 24 hour(s))  Urinalysis, Routine w reflex microscopic (not at [facility])     Status: Abnormal   Collection Time: 04/18/16  7:00 PM  Result Value Ref Range   Color, Urine YELLOW YELLOW   APPearance CLEAR CLEAR   Specific Gravity, Urine >1.030 (H) 1.005 - 1.030   pH 6.0 5.0 - 8.0   Glucose, UA NEGATIVE NEGATIVE mg/dL   Hgb urine dipstick NEGATIVE NEGATIVE   Bilirubin Urine NEGATIVE NEGATIVE   Ketones, ur 15 (A) NEGATIVE mg/dL   Protein, ur NEGATIVE NEGATIVE mg/dL   Nitrite NEGATIVE NEGATIVE   Leukocytes, UA MODERATE (A) NEGATIVE  Urine rapid drug screen (hosp performed)     Status: Abnormal   Collection Time: 04/18/16  7:00 PM  Result Value Ref Range   Opiates NONE DETECTED NONE DETECTED   Cocaine NONE DETECTED NONE DETECTED   Benzodiazepines NONE DETECTED NONE DETECTED   Amphetamines NONE DETECTED NONE DETECTED   Tetrahydrocannabinol POSITIVE (A) NONE DETECTED   Barbiturates NONE DETECTED NONE DETECTED  Urine microscopic-add on     Status: Abnormal   Collection Time: 04/18/16  7:00 PM  Result Value Ref Range   Squamous Epithelial / LPF 6-30 (A) NONE SEEN   WBC, UA 6-30 0 - 5 WBC/hpf   RBC / HPF NONE SEEN 0 - 5 RBC/hpf   Bacteria, UA FEW (A) NONE SEEN   Crystals CA OXALATE CRYSTALS (A) NEGATIVE  Wet prep, genital     Status: Abnormal    Collection Time: 04/18/16  7:56 PM  Result Value Ref Range   Yeast Wet Prep HPF POC PRESENT (A) NONE SEEN   Trich, Wet Prep NONE SEEN NONE SEEN   Clue Cells Wet Prep HPF POC PRESENT (A) NONE SEEN   WBC, Wet Prep HPF POC MODERATE (A) NONE SEEN   Sperm NONE SEEN     Assessment and Plan   1. Yeast vaginitis   2. BV (bacterial vaginosis)   3. Round ligament pain   4. Spotting complicating pregnancy, third trimester      Medication List    TAKE these medications  [facility] PRENATAL GUMMY PO  Take 1 each by mouth 3 (three) times daily.     fluconazole 150 MG tablet  Commonly known as:  DIFLUCAN  Take 1tab po now and repeat in 2 days if needed     metroNIDAZOLE 500 MG tablet  Commonly known as:  FLAGYL  Take 1 tablet (500 mg total) by mouth 2 (two) times daily.       Follow-up Information    Follow up with [facility].   Specialty:  Obstetrics and Gynecology   Why:  Keep your scheduled prenatal appointment unless you are called to come in sooner   Contact information:   [address] [place] [address] [phone]      Isauro Skelley 04/18/2016, 7:39 PM

## 2016-04-18 NOTE — MAU Note (Signed)
Patient presents with c/o of abdominal pain that comes and goes and spotting that started 3 days ago. Patient also states that she had a sharp pain this morning in the middle of her stomach that made her drop to her knees.

## 2016-04-19 LAB — GC/CHLAMYDIA PROBE AMP (~~LOC~~) NOT AT ARMC
Chlamydia: NEGATIVE
Neisseria Gonorrhea: NEGATIVE

## 2016-05-04 ENCOUNTER — Ambulatory Visit (INDEPENDENT_AMBULATORY_CARE_PROVIDER_SITE_OTHER): Payer: Self-pay | Admitting: Obstetrics & Gynecology

## 2016-05-04 ENCOUNTER — Encounter: Payer: Self-pay | Admitting: Obstetrics & Gynecology

## 2016-05-04 ENCOUNTER — Other Ambulatory Visit (HOSPITAL_COMMUNITY)
Admission: RE | Admit: 2016-05-04 | Discharge: 2016-05-04 | Disposition: A | Discharge status: Home / Self Care | Payer: Medicaid Other | Source: Ambulatory Visit | Attending: Obstetrics & Gynecology | Admitting: Obstetrics & Gynecology

## 2016-05-04 VITALS — BP 108/64 | HR 80 | Wt 152.5 lb

## 2016-05-04 DIAGNOSIS — O09293 Supervision of pregnancy with other poor reproductive or obstetric history, third trimester: Secondary | ICD-10-CM | POA: Diagnosis present

## 2016-05-04 DIAGNOSIS — Z113 Encounter for screening for infections with a predominantly sexual mode of transmission: Secondary | ICD-10-CM | POA: Diagnosis present

## 2016-05-04 DIAGNOSIS — Z3493 Encounter for supervision of normal pregnancy, unspecified, third trimester: Secondary | ICD-10-CM

## 2016-05-04 DIAGNOSIS — A499 Bacterial infection, unspecified: Secondary | ICD-10-CM

## 2016-05-04 DIAGNOSIS — N760 Acute vaginitis: Secondary | ICD-10-CM

## 2016-05-04 DIAGNOSIS — B9689 Other specified bacterial agents as the cause of diseases classified elsewhere: Secondary | ICD-10-CM

## 2016-05-04 LAB — POCT URINALYSIS DIP (DEVICE)
Bilirubin Urine: NEGATIVE
GLUCOSE, UA: NEGATIVE mg/dL
KETONES UR: NEGATIVE mg/dL
Nitrite: NEGATIVE
PH: 7 (ref 5.0–8.0)
PROTEIN: NEGATIVE mg/dL
Specific Gravity, Urine: 1.02 (ref 1.005–1.030)
UROBILINOGEN UA: 2 mg/dL — AB (ref 0.0–1.0)

## 2016-05-04 LAB — OB RESULTS CONSOLE GBS: GBS: NEGATIVE

## 2016-05-04 MED ORDER — METRONIDAZOLE 0.75 % VA GEL: 1.0000 | Freq: Every day | VAGINAL | Status: DC

## 2016-05-04 NOTE — Patient Instructions (Signed)
Return to clinic for any scheduled appointments or obstetric concerns, or go to MAU for evaluation  

## 2016-05-04 NOTE — Progress Notes (Signed)
New ob/28 wk packet given 36 wk cultures today  Pt requests a different medication to tx BV other than Flagyl decrease pt appetite

## 2016-05-04 NOTE — Progress Notes (Signed)
Subjective:  Theresa [name] is a 25 y.o. 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  Normal fetal growth so far this pregnancy, no abnormalities.  She is currently monitored for the following issues for this low-risk pregnancy and has Supervision of normal pregnancy; Pica; Cigarette smoker; History of fetal abnormality in previous pregnancy, currently pregnant; Yeast vaginitis; BV (bacterial vaginosis); Spotting complicating pregnancy; and Marijuana use on her problem list.  Patient reports vaginal irritation; was treated for BV with oral Flagyl but desires vaginal regimen due to GI side effects.  Contractions: Irritability. Vag. Bleeding: None.  Movement: Present. Denies leaking of fluid.   The following portions of the patient's history were reviewed and updated as appropriate: allergies, current medications, past family history, past medical history, past social history, past surgical history and problem list. Problem list updated.  Objective:   Filed Vitals:   05/04/16 0931  BP: 108/64  Pulse: 80  Weight: 152 lb 8 oz (69.174 kg)    Fetal Status: Fetal Heart Rate (bpm): 145 Fundal Height: 36 cm Movement: Present  Presentation: Vertex  General:  Alert, oriented and cooperative. Patient is in no acute distress.  Skin: Skin is warm and dry. No rash noted.   Cardiovascular: Normal heart rate noted  Respiratory: Normal respiratory effort, no problems with respiration noted  Abdomen: Soft, gravid, appropriate for gestational age. Pain/Pressure: Present     Pelvic: Cervical exam performed Dilation: 1 Effacement (%): Thick Station: -3  Extremities: Normal range of motion.  Edema: Trace  Mental Status: Normal mood and affect. Normal behavior. Normal judgment and thought content.   Urinalysis: Urine Protein: Negative Urine Glucose: Negative  Assessment and Plan:  Pregnancy: G4P3003 at 7568w6d  1. History of  fetal abnormality in previous pregnancy, currently pregnant, third trimester Normal fetal growth. No concerns this pregnancy so far.  2. BV (bacterial vaginosis) Vaginal Rx given - metroNIDAZOLE (METROGEL) 0.75 % vaginal gel; Place 1 Applicatorful vaginally at bedtime. Apply one applicatorful to vagina at bedtime for 5 days  Dispense: 70 g; Refill: 1  3. Supervision of normal pregnancy, third trimester Pelvic cultures done - POCT urinalysis dip (device) - Culture, beta strep (group b only) - GC/Chlamydia probe amp ([place])not at [facility]  Preterm labor symptoms and general obstetric precautions including but not limited to vaginal bleeding, contractions, leaking of fluid and fetal movement were reviewed in detail with the patient. Please refer to After Visit Summary for other counseling recommendations.  Return in about 2 weeks (around 05/18/2016) for OB Visit.   Tereso NewcomerUgonna A Claudius Mich, MD

## 2016-05-05 LAB — GC/CHLAMYDIA PROBE AMP (~~LOC~~) NOT AT ARMC
CHLAMYDIA, DNA PROBE: NEGATIVE
Neisseria Gonorrhea: NEGATIVE

## 2016-05-06 LAB — CULTURE, BETA STREP (GROUP B ONLY)

## 2016-05-11 ENCOUNTER — Encounter: Payer: Medicaid Other | Admitting: Family Medicine

## 2016-05-16 ENCOUNTER — Inpatient Hospital Stay (HOSPITAL_COMMUNITY): Payer: Medicaid Other | Admitting: Anesthesiology

## 2016-05-16 ENCOUNTER — Encounter (HOSPITAL_COMMUNITY): Payer: Self-pay | Admitting: *Deleted

## 2016-05-16 ENCOUNTER — Inpatient Hospital Stay (HOSPITAL_COMMUNITY)
Admission: AD | Admit: 2016-05-16 | Discharge: 2016-05-18 | DRG: 775 | Disposition: A | Discharge status: Home / Self Care | Payer: Medicaid Other | Source: Ambulatory Visit | Attending: Obstetrics & Gynecology | Admitting: Obstetrics & Gynecology

## 2016-05-16 DIAGNOSIS — F5089 Other specified eating disorder: Secondary | ICD-10-CM | POA: Diagnosis present

## 2016-05-16 DIAGNOSIS — F17210 Nicotine dependence, cigarettes, uncomplicated: Secondary | ICD-10-CM | POA: Diagnosis present

## 2016-05-16 DIAGNOSIS — Z3A37 37 weeks gestation of pregnancy: Secondary | ICD-10-CM

## 2016-05-16 DIAGNOSIS — F1290 Cannabis use, unspecified, uncomplicated: Secondary | ICD-10-CM | POA: Diagnosis present

## 2016-05-16 DIAGNOSIS — O99334 Smoking (tobacco) complicating childbirth: Secondary | ICD-10-CM | POA: Diagnosis not present

## 2016-05-16 DIAGNOSIS — O9962 Diseases of the digestive system complicating childbirth: Secondary | ICD-10-CM | POA: Diagnosis present

## 2016-05-16 DIAGNOSIS — O99344 Other mental disorders complicating childbirth: Secondary | ICD-10-CM | POA: Diagnosis present

## 2016-05-16 DIAGNOSIS — K219 Gastro-esophageal reflux disease without esophagitis: Secondary | ICD-10-CM | POA: Diagnosis present

## 2016-05-16 DIAGNOSIS — O99324 Drug use complicating childbirth: Secondary | ICD-10-CM | POA: Diagnosis present

## 2016-05-16 DIAGNOSIS — IMO0001 Reserved for inherently not codable concepts without codable children: Secondary | ICD-10-CM

## 2016-05-16 DIAGNOSIS — F32A Depression, unspecified: Secondary | ICD-10-CM | POA: Clinically undetermined

## 2016-05-16 DIAGNOSIS — Z825 Family history of asthma and other chronic lower respiratory diseases: Secondary | ICD-10-CM | POA: Diagnosis not present

## 2016-05-16 DIAGNOSIS — F329 Major depressive disorder, single episode, unspecified: Secondary | ICD-10-CM | POA: Diagnosis present

## 2016-05-16 LAB — CBC
HCT: 32.2 % — ABNORMAL LOW (ref 36.0–46.0)
Hemoglobin: 10.7 g/dL — ABNORMAL LOW (ref 12.0–15.0)
MCH: 27.5 pg (ref 26.0–34.0)
MCHC: 33.2 g/dL (ref 30.0–36.0)
MCV: 82.8 fL (ref 78.0–100.0)
Platelets: 243 10*3/uL (ref 150–400)
RBC: 3.89 MIL/uL (ref 3.87–5.11)
RDW: 14.1 % (ref 11.5–15.5)
WBC: 12.5 10*3/uL — ABNORMAL HIGH (ref 4.0–10.5)

## 2016-05-16 LAB — RAPID HIV SCREEN (HIV 1/2 AB+AG)
HIV 1/2 ANTIBODIES: NONREACTIVE
HIV-1 P24 Antigen - HIV24: NONREACTIVE

## 2016-05-16 LAB — OB RESULTS CONSOLE ABO/RH: RH TYPE: POSITIVE

## 2016-05-16 LAB — TYPE AND SCREEN
ABO/RH(D): B POS
ANTIBODY SCREEN: NEGATIVE

## 2016-05-16 LAB — RPR: RPR Ser Ql: NONREACTIVE

## 2016-05-16 MED ORDER — EPHEDRINE 5 MG/ML INJ
10.0000 mg | INTRAVENOUS | Status: DC | PRN
  Filled 2016-05-16: qty 2

## 2016-05-16 MED ORDER — PHENYLEPHRINE 40 MCG/ML (10ML) SYRINGE FOR IV PUSH (FOR BLOOD PRESSURE SUPPORT)
80.0000 ug | PREFILLED_SYRINGE | INTRAVENOUS | Status: DC | PRN
  Filled 2016-05-16: qty 5

## 2016-05-16 MED ORDER — WITCH HAZEL-GLYCERIN EX PADS: 1.0000 "application " | MEDICATED_PAD | CUTANEOUS | Status: DC | PRN

## 2016-05-16 MED ORDER — ACETAMINOPHEN 325 MG PO TABS
650.0000 mg | ORAL_TABLET | ORAL | Status: DC | PRN
  Administered 2016-05-16 (×2): 650 mg via ORAL
  Filled 2016-05-16 (×2): qty 2

## 2016-05-16 MED ORDER — SOD CITRATE-CITRIC ACID 500-334 MG/5ML PO SOLN: 30.0000 mL | ORAL | Status: DC | PRN

## 2016-05-16 MED ORDER — FENTANYL 2.5 MCG/ML BUPIVACAINE 1/10 % EPIDURAL INFUSION (WH - ANES)
14.0000 mL/h | INTRAMUSCULAR | Status: DC | PRN
  Administered 2016-05-16: 14 mL/h via EPIDURAL
  Filled 2016-05-16: qty 125

## 2016-05-16 MED ORDER — DIBUCAINE 1 % RE OINT: 1.0000 "application " | TOPICAL_OINTMENT | RECTAL | Status: DC | PRN

## 2016-05-16 MED ORDER — PRENATAL MULTIVITAMIN CH
1.0000 | ORAL_TABLET | Freq: Every day | ORAL | Status: DC
  Filled 2016-05-16 (×2): qty 1

## 2016-05-16 MED ORDER — LIDOCAINE HCL (PF) 1 % IJ SOLN
INTRAMUSCULAR | Status: DC | PRN
  Administered 2016-05-16 (×2): 6 mL via EPIDURAL

## 2016-05-16 MED ORDER — IBUPROFEN 600 MG PO TABS
600.0000 mg | ORAL_TABLET | Freq: Four times a day (QID) | ORAL | Status: DC
  Administered 2016-05-16 – 2016-05-18 (×9): 600 mg via ORAL
  Filled 2016-05-16 (×9): qty 1

## 2016-05-16 MED ORDER — COCONUT OIL OIL: 1.0000 "application " | TOPICAL_OIL | Status: DC | PRN

## 2016-05-16 MED ORDER — LACTATED RINGERS IV SOLN: 500.0000 mL | Freq: Once | INTRAVENOUS | Status: DC

## 2016-05-16 MED ORDER — SIMETHICONE 80 MG PO CHEW: 80.0000 mg | CHEWABLE_TABLET | ORAL | Status: DC | PRN

## 2016-05-16 MED ORDER — LACTATED RINGERS IV SOLN
500.0000 mL | Freq: Once | INTRAVENOUS | Status: DC
Start: 2016-05-16 — End: 2016-05-16

## 2016-05-16 MED ORDER — ONDANSETRON HCL 4 MG/2ML IJ SOLN: 4.0000 mg | INTRAMUSCULAR | Status: DC | PRN

## 2016-05-16 MED ORDER — DIPHENHYDRAMINE HCL 50 MG/ML IJ SOLN: 12.5000 mg | INTRAMUSCULAR | Status: DC | PRN

## 2016-05-16 MED ORDER — SENNOSIDES-DOCUSATE SODIUM 8.6-50 MG PO TABS
2.0000 | ORAL_TABLET | ORAL | Status: DC
  Administered 2016-05-16 – 2016-05-17 (×2): 2 via ORAL
  Filled 2016-05-16 (×2): qty 2

## 2016-05-16 MED ORDER — ONDANSETRON HCL 4 MG PO TABS: 4.0000 mg | ORAL_TABLET | ORAL | Status: DC | PRN

## 2016-05-16 MED ORDER — LACTATED RINGERS IV SOLN
500.0000 mL | Freq: Once | INTRAVENOUS | Status: AC
  Administered 2016-05-16: 500 mL via INTRAVENOUS

## 2016-05-16 MED ORDER — LACTATED RINGERS IV SOLN
INTRAVENOUS | Status: DC
  Administered 2016-05-16: 125 mL/h via INTRAVENOUS

## 2016-05-16 MED ORDER — OXYTOCIN 40 UNITS IN LACTATED RINGERS INFUSION - SIMPLE MED
2.5000 [IU]/h | INTRAVENOUS | Status: DC
  Filled 2016-05-16: qty 1000

## 2016-05-16 MED ORDER — PHENYLEPHRINE 40 MCG/ML (10ML) SYRINGE FOR IV PUSH (FOR BLOOD PRESSURE SUPPORT)
80.0000 ug | PREFILLED_SYRINGE | INTRAVENOUS | Status: DC | PRN
  Filled 2016-05-16: qty 10
  Filled 2016-05-16: qty 5

## 2016-05-16 MED ORDER — ONDANSETRON HCL 4 MG/2ML IJ SOLN: 4.0000 mg | Freq: Four times a day (QID) | INTRAMUSCULAR | Status: DC | PRN

## 2016-05-16 MED ORDER — SODIUM CHLORIDE 0.9 % IV SOLN
14.0000 mL/h | INTRAVENOUS | Status: DC | PRN
  Filled 2016-05-16 (×2): qty 17

## 2016-05-16 MED ORDER — BENZOCAINE-MENTHOL 20-0.5 % EX AERO: 1.0000 "application " | INHALATION_SPRAY | CUTANEOUS | Status: DC | PRN

## 2016-05-16 MED ORDER — TETANUS-DIPHTH-ACELL PERTUSSIS 5-2.5-18.5 LF-MCG/0.5 IM SUSP: 0.5000 mL | Freq: Once | INTRAMUSCULAR | Status: DC

## 2016-05-16 MED ORDER — OXYTOCIN BOLUS FROM INFUSION
500.0000 mL | INTRAVENOUS | Status: DC
  Administered 2016-05-16: 500 mL via INTRAVENOUS

## 2016-05-16 MED ORDER — LIDOCAINE HCL (PF) 1 % IJ SOLN
30.0000 mL | INTRAMUSCULAR | Status: DC | PRN
  Filled 2016-05-16: qty 30

## 2016-05-16 MED ORDER — OXYCODONE-ACETAMINOPHEN 5-325 MG PO TABS: 1.0000 | ORAL_TABLET | ORAL | Status: DC | PRN

## 2016-05-16 MED ORDER — OXYCODONE-ACETAMINOPHEN 5-325 MG PO TABS: 2.0000 | ORAL_TABLET | ORAL | Status: DC | PRN

## 2016-05-16 MED ORDER — DIPHENHYDRAMINE HCL 25 MG PO CAPS: 25.0000 mg | ORAL_CAPSULE | Freq: Four times a day (QID) | ORAL | Status: DC | PRN

## 2016-05-16 MED ORDER — ACETAMINOPHEN 325 MG PO TABS: 650.0000 mg | ORAL_TABLET | ORAL | Status: DC | PRN

## 2016-05-16 MED ORDER — LACTATED RINGERS IV SOLN: 500.0000 mL | INTRAVENOUS | Status: DC | PRN

## 2016-05-16 MED ORDER — ZOLPIDEM TARTRATE 5 MG PO TABS: 5.0000 mg | ORAL_TABLET | Freq: Every evening | ORAL | Status: DC | PRN

## 2016-05-16 NOTE — Anesthesia Postprocedure Evaluation (Signed)
Anesthesia Post Note  Patient: Theresa [name]  Procedure(s) Performed: * No procedures listed *  Patient location during evaluation: Mother Baby Anesthesia Type: Epidural Level of consciousness: oriented and awake and alert Pain management: pain level controlled Vital Signs Assessment: post-procedure vital signs reviewed and stable Respiratory status: spontaneous breathing and nonlabored ventilation Cardiovascular status: stable Postop Assessment: epidural receding, patient able to bend at knees, no signs of nausea or vomiting and adequate PO intake Anesthetic complications: no     Last Vitals:  Filed Vitals:   05/16/16 0615 05/16/16 0715  BP: 113/67 103/57  Pulse: 68 65  Temp: 36.3 C 36.6 C  Resp: 18 20    Last Pain:  Filed Vitals:   05/16/16 0726  PainSc: 3    Pain Goal: Patients Stated Pain Goal: 7 (05/16/16 0258)               Laban EmperorMalinova,Julizza Sassone Hristova

## 2016-05-16 NOTE — Anesthesia Procedure Notes (Signed)
Epidural Patient location during procedure: OB Start time: 05/16/2016 3:10 AM End time: 05/16/2016 3:35 AM  Staffing Anesthesiologist: Jairo BenJACKSON, Cher Franzoni Performed by: anesthesiologist   Preanesthetic Checklist Completed: patient identified, surgical consent, pre-op evaluation, timeout performed, IV checked, risks and benefits discussed and monitors and equipment checked  Epidural Patient position: sitting Prep: site prepped and draped and DuraPrep Patient monitoring: blood pressure, continuous pulse ox and heart rate Approach: midline Location: L2-L3 Injection technique: LOR air  Needle:  Needle type: Tuohy  Needle gauge: 17 G Needle insertion depth: 6 cm Catheter type: closed end flexible Catheter size: 19 Gauge Catheter at skin depth: 12 cm Test dose: negative (1% lidocaine)  Additional Notes LOR 6 cm  Cath 12 cm skin Pt identified in labor room.  Monitors applied. Working IV access confirmed. Sterile prep, drape lumbar spine.  1% lido local L 2,3.  #17ga Touhy LOR air at 6 cm, L2,3, cath in easily to 12 cm skin.  Test dose OK, cath dosed and infusion begun.  Patient asymptomatic, VSS, no heme aspirated, tolerated well.  Sandford Craze Yizel Canby, MD Reason for block:procedure for pain

## 2016-05-16 NOTE — Lactation Note (Signed)
This note was copied from a baby's chart. Lactation Consultation Note Mom was going to be breast and formula feed. Now mom is now just formula feeding. Patient Name: Theresa Birdie SonsJamel Espana [id number]'s Date: 05/16/2016 Reason for consult: Initial assessment   Maternal Data    Feeding    LATCH Score/Interventions                      Lactation Tools Discussed/Used     Consult Status Consult Status: Complete Date: 05/16/16    Charyl DancerCARVER, Courtnay Petrilla G 05/16/2016, 6:32 AM

## 2016-05-16 NOTE — Anesthesia Preprocedure Evaluation (Signed)
Anesthesia Evaluation  Patient identified by MRN, date of birth, ID band Patient awake    Reviewed: Allergy & Precautions, NPO status , Patient's Chart, lab work & pertinent test results  History of Anesthesia Complications Negative for: history of anesthetic complications  Airway Mallampati: II  TM Distance: >3 FB Neck ROM: Full    Dental  (+) Dental Advisory Given   Pulmonary Current Smoker,    breath sounds clear to auscultation       Cardiovascular negative cardio ROS   Rhythm:Regular Rate:Normal     Neuro/Psych negative neurological ROS     GI/Hepatic GERD  Poorly Controlled,(+)     substance abuse  marijuana use,   Endo/Other  negative endocrine ROS  Renal/GU negative Renal ROS     Musculoskeletal   Abdominal   Peds  Hematology plt 243K   Anesthesia Other Findings   Reproductive/Obstetrics (+) Pregnancy                             Anesthesia Physical Anesthesia Plan  ASA: II  Anesthesia Plan: Epidural   Post-op Pain Management:    Induction:   Airway Management Planned: Natural Airway  Additional Equipment:   Intra-op Plan:   Post-operative Plan:   Informed Consent: I have reviewed the patients History and Physical, chart, labs and discussed the procedure including the risks, benefits and alternatives for the proposed anesthesia with the patient or authorized representative who has indicated his/her understanding and acceptance.   Dental advisory given  Plan Discussed with:   Anesthesia Plan Comments: (Patient identified. Risks/Benefits/Options discussed with patient including but not limited to bleeding, infection, nerve damage, paralysis, failed block, incomplete pain control, headache, blood pressure changes, nausea, vomiting, reactions to medication both or allergic, itching and postpartum back pain. Confirmed with bedside nurse the patient's most recent  platelet count. Confirmed with patient that they are not currently taking any anticoagulation, have any bleeding history or any family history of bleeding disorders. Patient expressed understanding and wished to proceed. All questions were answered.  )        Anesthesia Quick Evaluation

## 2016-05-16 NOTE — H&P (Signed)
LABOR ADMISSION HISTORY AND PHYSICAL  Theresa [name] is a 25 y.o. female [phone] with IUP at 9519w4d by 8wk US presenting for SOL. She reports +FM, + contractions, No LOF, no VB, no blurry vision, headaches or peripheral edema, and RUQ pain.  She plans on breast and bottle feeding. She request Depo for birth control.  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 Chlamydia   . Gonorrhea   . Depression   . Pyelonephritis     X 2  . Eczema     Past Surgical History: Past Surgical History  Procedure Laterality Date  . No past surgeries      Obstetrical History: OB History    Gravida Para Term Preterm AB TAB SAB Ectopic Multiple Living   4 3 3       0 3      Social History: Social History   Social History  . Marital Status: Single    Spouse Name: N/A  . Number of Children: N/A  . Years of Education: N/A   Social History Main Topics  . Smoking status: Current Every Day Smoker -- 0.50 packs/day    Types: Cigarettes  . Smokeless tobacco: Never Used  . Alcohol Use: No     Comment: occas.  . Drug Use: No     Comment: last use one week ago  . Sexual Activity: Yes    Birth Control/ Protection: Condom   Other Topics Concern  . Not on file   Social History Narrative    Family History: Family History  Problem Relation Age of Onset  . Birth defects Neg Hx   . Diabetes Neg Hx   . Stroke Neg Hx   . Hypertension Neg Hx   . Anesthesia problems Neg Hx   . Hypotension Neg Hx   . Malignant hyperthermia Neg Hx   . Pseudochol deficiency Neg Hx   . Asthma Daughter   . Domonik Levario syndrome Son     Allergies: Allergies  Allergen Reactions  . Latex Itching and Swelling    Prescriptions prior to admission   Medication Sig Dispense Refill Last Dose  . metroNIDAZOLE (FLAGYL) 500 MG tablet Take 1 tablet (500 mg total) by mouth 2 (two) times daily. (Patient not taking: Reported on 05/04/2016) 14 tablet 0 Not Taking  . metroNIDAZOLE (METROGEL) 0.75 % vaginal gel Place 1 Applicatorful vaginally at bedtime. Apply one applicatorful to vagina at bedtime for 5 days 70 g 1   . Prenatal Vit-Min-FA-Fish Oil ([employer] PRENATAL GUMMY PO) Take 1 each by mouth 3 (three) times daily.   Taking     Review of Systems   All systems reviewed and negative except as stated in HPI  BP 112/58 mmHg  Temp(Src) 99 F (37.2 C) (Oral)  Resp 18  SpO2 100%  LMP 07/08/2015 General appearance: alert and cooperative Lungs: clear to auscultation bilaterally Heart: regular rate and rhythm Abdomen: soft, non-tender; bowel sounds normal Extremities: Homans sign is negative, no sign of DVT, edema Fetal monitoringBaseline: 150 bpm, Variability: Good {> 6 bpm), Accelerations: none and Decelerations: Early decels, one prolonged decel Uterine activityFrequency: Every 3-6  minutes Dilation: 4 Effacement (%): 80 Station: -1 Exam by:: Camelia Enganielle Simpson RN  Prenatal labs: ABO, Rh:  B pos Antibody:  neg Rubella: Immune  RPR: Nonreactive (05/01 0000)  HBsAg: Negative (05/01 0000)  HIV: obatained n 03/2016 but no result  GBS:   Negative 1 hr Glucola 105 Genetic screening  CF nml Anatomy US nml  Prenatal Transfer Tool  Maternal Diabetes: No Genetic Screening: CF screen neg Maternal Ultrasounds/Referrals: Normal Fetal Ultrasounds or other Referrals:  None Maternal Substance Abuse:  Yes:  Type: Marijuana Significant Maternal Medications:  None Significant Maternal Lab Results: Lab values include: Group B Strep negative  No results found for this or any previous visit (from the past 24 hour(s)).  Patient Active Problem List   Diagnosis Date Noted  . Cigarette smoker 04/18/2016  . History of fetal abnormality in previous  pregnancy, currently pregnant 04/18/2016  . Yeast vaginitis 04/18/2016  . BV (bacterial vaginosis) 04/18/2016  . Spotting complicating pregnancy 04/18/2016  . 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 Supervision of normal pregnancy 03/15/2016  . Pica 03/15/2016    Assessment: Theresa [name] is a 25 y.o. G4P3003 at [redacted]w[redacted]d here for SOL  #Labor: SOL #Pain: Epidural #FWB: Cat 2: early decels, with one prolonged decel, mod variability #ID: negative #MOF: breast and bottle #MOC: Depo  #Circ: outpatient #unknown HIV: ordered on admision  Palma Holter, MD PGY 2 Family Medicine   The patient was seen and examined by me also Agree with note NST reactive and reassuring with decels as listed. Good variability. UCs as listed Cervical exams as listed in note  Aviva Signs, CNM

## 2016-05-17 NOTE — Progress Notes (Signed)
Post Partum Day 1  Subjective:  Jack QuartoJamel L Jenniges is a 25 y.o. [id number] 9076w4d s/p SVD.  No acute events overnight.  Pt denies problems with ambulating, voiding or po intake.  She denies nausea or vomiting.  Pain is moderately controlled.  She has had flatus. She has not had a bowel movement.  Lochia moderate, changed pads 3x overnight.  Plan for birth control is depo.  Method of Feeding: bottle.  Objective: BP 102/59 mmHg  Pulse 47  Temp(Src) 97.8 F (36.6 C) (Oral)  Resp 18  Ht 5' (1.524 m)  Wt 70.761 kg (156 lb)  BMI 30.47 kg/m2  SpO2 100%  LMP 07/08/2015  Breastfeeding? Unknown  Physical Exam:  General: alert, cooperative and no distress Lochia:normal flow Chest: CTAB Heart: RRR no m/r/g Abdomen: +BS, soft, tender to palpation, fundus firm at/below umbilicus Uterine Fundus: firm, nontender DVT Evaluation: No evidence of DVT seen on physical exam. Extremities: no edema   Recent Labs  05/16/16 0235  HGB 10.7*  HCT 32.2*    Assessment/Plan:  ASSESSMENT: Jack QuartoJamel L Brocksmith is a 25 y.o. [id number] 5876w4d ppd #1 s/p NSVD doing well.   Plan for discharge tomorrow and Contraception with depo prior to discharge    LOS: 1 day   Bayard HuggerLauren J Raford Brissett  Medical Student  05/17/2016, 7:41 AM

## 2016-05-17 NOTE — Progress Notes (Signed)
POSTPARTUM PROGRESS NOTE  Post Partum Day 1 Subjective:  Theresa [name] is a 25 y.o. [id number] 3756w4d s/p nsvd.  No acute events overnight.  Pt denies problems with ambulating, voiding or po intake.  She denies nausea or vomiting.  Pain is well controlled.  She has had flatus. She has not had bowel movement.  Lochia Small.   Objective: Blood pressure 125/70, pulse 73, temperature 97.1 F (36.2 C), temperature source Oral, resp. rate 20, height 5' (1.524 m), weight 156 lb (70.761 kg), last menstrual period 07/08/2015, SpO2 100 %, unknown if currently breastfeeding.  Physical Exam:  General: alert, cooperative and no distress Lochia:normal flow Chest: CTAB Heart: RRR no m/r/g Abdomen: +BS, soft, nontender,  Uterine Fundus: firm,  DVT Evaluation: No calf swelling or tenderness Extremities: trace edema   Recent Labs  05/16/16 0235  HGB 10.7*  HCT 32.2*    Assessment/Plan:  ASSESSMENT: Theresa [name] is a 25 y.o. A5W0981G4P4004 1856w4d s/p nsvd, doing well.  Plan for discharge tomorrow   LOS: 1 day   Silvano Bilisoah B Wouk 05/17/2016, 6:19 AM

## 2016-05-17 NOTE — Progress Notes (Signed)
CSW made report to CPS, and provided intake staff with positive UDS screen.

## 2016-05-17 NOTE — Clinical Social Work Maternal (Signed)
  CLINICAL SOCIAL WORK MATERNAL/CHILD NOTE  Patient Details  Name: Theresa [name] MRN: [id number] Date of Birth: Sep 30, 1991  Date:  05/17/2016  Clinical Social Worker Initiating Note:  Theresa [name] Date/ Time Initiated:  05/17/16/1044     Child's Name:  Theresa [name]   Legal Guardian:  Mother   Need for Interpreter:  None   Date of Referral:  05/17/16     Reason for Referral:  Late or No Prenatal Care , Current Substance Use/Substance Use During Pregnancy    Referral Source:  [facility]   Address:  [address] [place] [address]  Phone number:      Household Members:  Self, Minor Children, Parents   Natural Supports (not living in the home):  Parent, Extended Family, Immediate Family, Spouse/significant other   Professional Supports: Case Metallurgist (Baby Love Science writer)   Employment: Unemployed   Type of Work:     Education:      Pensions consultant:  [employer]   Other Resources:  Theatre stage manager Considerations Which May Impact Care:  None reported  Strengths:  Ability to meet basic needs , Home prepared for child , Pediatrician chosen    Risk Factors/Current Problems:  Substance Use , Other (Comment) (Limited Prenatal Care)   Cognitive State:  Alert , Linear Thinking    Mood/Affect:  Comfortable , Calm , Relaxed    CSW Assessment: CSW met with [name] to complete an assessment for hx of THC use during pregnancy and limited PNC.  [name] was inviting, polite, and was open to meeting with CSW.  CSW inquired about [name]'s limited PNC, and [name] stated she received [facility], however, she missed several appointments and care was transferred to [facility].  CSW encouraged [name] to keep postpartum appointments and communicate with clinic if she needs to reschedule.  CSW also inquired about [name]'s SA hx.  [name] was honest, and reported she utilized marijuana throughout pregnancy. CSW thanked [name] for being honest, and reviewed  hospital's drug screen policy.  CSW informed [name] that infant had a positive UDS for marijuana, and a report will be made to CPS.  [name] did not appear concerned, and expressed that she experienced CPS involvement with her last pregnancy because of a positive UDS.  CSW offered resources and referrals for SA treatment and interventions, and [name] declined.  [name] also declined a referral to [facility] for parenting education.  CSW educated [name] about PPD and informed [name] of possible supports and interventions to decrease PPD.  CSW also encouraged [name] to seek medical attention if needed for increased signs and symptoms for PPD. CSW reviewed safe sleep, and SIDS. [name] was knowledgeable, and expressed that she remembers information for her previous pregnancies.  [name] did not have any questions or concerns at this time, and CSW thanked [name] for allowing CSW to meet with her.  CSW also inquired about [name]'s room conflict on 7/40/81.  [name] stated [name] and [name]'s sister had a disagreement, and the communication escalated which made hospital staff concerned.  [name] denied safety concerns and expressed that [name] and sister are "OK" now.   CSW Plan/Description:  Child Protective Service Report , No Further Intervention Required/No Barriers to Discharge, Patient/Family Education , Psychosocial Support and Ongoing Assessment of Needs    Theresa Dimiceli D BOYD-GILYARD, LCSW 05/17/2016, 10:48 AM

## 2016-05-18 MED ORDER — IBUPROFEN 600 MG PO TABS: 600.0000 mg | ORAL_TABLET | Freq: Four times a day (QID) | ORAL | Status: DC | PRN

## 2016-05-18 MED ORDER — DOCUSATE SODIUM 100 MG PO CAPS: 100.0000 mg | ORAL_CAPSULE | Freq: Two times a day (BID) | ORAL | Status: DC

## 2016-05-18 NOTE — Discharge Instructions (Signed)

## 2016-05-18 NOTE — Discharge Summary (Signed)
OB Discharge Summary     Patient Name: Theresa [name] DOB: Apr 03, 1991 MRN: [id number]  Date of admission: 05/16/2016 Delivering MD: Palma Holter   Date of discharge: 05/18/2016  Admitting diagnosis: 38 WEEKS CTX  Intrauterine pregnancy: [redacted]w[redacted]d     Secondary diagnosis:  Active Problems:   Depression   Active labor  Additional problems: none      Discharge diagnosis: Term Pregnancy Delivered                                                                                                Post partum procedures:none  Augmentation: AROM  Complications: None  Hospital course:  Onset of Labor With Vaginal Delivery     25 y.o. yo [id number] at [redacted]w[redacted]d was admitted in Active Labor on 05/16/2016. Patient had an uncomplicated labor course as follows:  Membrane Rupture Time/Date: 4:44 AM ,05/16/2016   Intrapartum Procedures: Episiotomy: None [1]                                         Lacerations:  None [1]  Patient had a delivery of a Viable infant. 05/16/2016  Information for the patient's newborn:  Hollyanne, Schloesser [[id number]]  Delivery Method: Vaginal, Spontaneous Delivery (Filed from Delivery Summary)    Pateint had an uncomplicated postpartum course.  She is ambulating, tolerating a regular diet, passing flatus, and urinating well. Patient is discharged home in stable condition on 05/18/2016.    Physical exam  Filed Vitals:   05/16/16 1500 05/17/16 0656 05/17/16 1800 05/18/16 0745  BP: 125/70 102/59 100/54 107/60  Pulse:  47 73 77  Temp:  97.8 F (36.6 C) 98.4 F (36.9 C) 98.4 F (36.9 C)  TempSrc:  Oral Oral Oral  Resp:  Height:      Weight:      SpO2:       General: alert, cooperative and no distress Lochia: appropriate Uterine Fundus: firm Incision: N/A DVT Evaluation: No evidence of DVT seen on physical exam. No significant calf/ankle edema. Labs: Lab Results  Component Value Date   WBC 12.5* 05/16/2016   HGB 10.7* 05/16/2016   HCT 32.2*  05/16/2016   MCV 82.8 05/16/2016   PLT 243 05/16/2016   CMP Latest Ref Rng 03/28/2010  Glucose 70 - 99 mg/dL 96  BUN 6 - 23 mg/dL 4(L)  Creatinine 0.4 - 1.2 mg/dL 1.30  Sodium 865 - 784 mEq/L 132(L)  Potassium 3.5 - 5.1 mEq/L 3.3(L)  Chloride 96 - 112 mEq/L 98  CO2 19 - 32 mEq/L 27  Calcium 8.4 - 10.5 mg/dL 8.8  Total Protein 6.0 - 8.3 g/dL 7.9  Total Bilirubin 0.3 - 1.2 mg/dL 1.0  Alkaline Phos 39 - 117 U/L 76  AST 0 - 37 U/L 32  ALT 0 - 35 U/L 18    Discharge instruction: per After Visit Summary and "Baby and Me Booklet".  After visit meds:    Medication List    STOP taking these  medications        metroNIDAZOLE 500 MG tablet  Commonly known as:  FLAGYL      TAKE these medications        [employer]S PRENATAL GUMMY PO  Take 1 tablet by mouth 3 (three) times daily.     docusate sodium 100 MG capsule  Commonly known as:  COLACE  Take 1 capsule (100 mg total) by mouth 2 (two) times daily.     ibuprofen 600 MG tablet  Commonly known as:  ADVIL,MOTRIN  Take 1 tablet (600 mg total) by mouth every 6 (six) hours as needed for mild pain or moderate pain.        Diet: routine diet  Activity: Advance as tolerated. Pelvic rest for 6 weeks.   Outpatient follow up:6 weeks Follow up Appt:No future appointments. Follow up Visit:No Follow-up on file.  Postpartum contraception: Depo Provera  Newborn Data: Live born female  Birth Weight: 7 lb 3.7 oz (3280 g) APGAR: 7, 9  Baby Feeding: Bottle and Breast Disposition:home with mother   05/18/2016 Loni MuseKate Timberlake, MD   CNM attestation I have seen and examined this patient and agree with above documentation in the resident's note.   Jack QuartoJamel L Mottola is a 25 y.o. [id number] s/p SVD.   Pain is well controlled.  Plan for birth control is Depo-Provera.  Method of Feeding: both  PE:  BP 107/60 mmHg  Pulse 77  Temp(Src) 98.4 F (36.9 C) (Oral)  Resp 18  Ht 5' (1.524 m)  Wt 70.761 kg (156 lb)  BMI 30.47 kg/m2  SpO2 100%  LMP  07/08/2015  Breastfeeding? Unknown Fundus firm  No results for input(s): HGB, HCT in the last 72 hours.   Plan: discharge today - postpartum care discussed - f/u clinic in 6 weeks for postpartum visit, and sooner if desires Depo prior to 6wks   Johnwesley Lederman, CNM 2:21 AM

## 2016-05-19 NOTE — Progress Notes (Signed)
LCSW received call from staff completing discharge call back contacts. [name] expresses to staff that she does not have any formula nor a crib/bassenett for baby to safely sleep.  LCSW has reviewed chart and aware that CPS is involved due to hx of SA. LCSW will follow up with [name] regarding formula as [name] but no formula to feed baby currently. LCSW will also follow up with FSN/other resources to supply baby bed for [name].  This Clinical research associatewriter did not assess [name]/complete work with [name], but will staff with colleague who assessed [name].  Theresa EmoryHannah Eino Whitner LCSW, MSW Clinical Social Work: System Insurance underwriterWide Float Coverage for [employer] NICU Clinical social worker [phone]

## 2016-05-19 NOTE — Progress Notes (Signed)
CSW attempted to follow-up with [name] regarding basic needs for the infant, via telephone. CSW was unable to leave a message because [name]'s voicemail box was full.  CSW will attempt to call at a later time.

## 2016-05-19 NOTE — Progress Notes (Signed)
Patient Information   Patient Name Sex DOB SSN  [name], [facility] Female 05/16/2018 [id number]  Progress Notes by Barbara CowerAngel D Boyd-Gilyard, LCSW at 05/18/2016 11:55 AM   Author: Barbara CowerAngel D Boyd-Gilyard, LCSW Service: CASE MANAGEMENT Author Type: Social Worker  Filed: 05/19/2016 2:14 PM Note Time: 05/18/2016 11:55 AM Status: Signed  Editor: Barbara CowerAngel D Boyd-Gilyard, LCSW (Social Worker)    Expand All Collapse All    CSW reported positive cord screen for infant to CPS intake worker

## 2016-08-10 ENCOUNTER — Ambulatory Visit: Payer: Medicaid Other | Admitting: Family

## 2016-08-15 ENCOUNTER — Ambulatory Visit: Payer: Medicaid Other | Admitting: Advanced Practice Midwife

## 2016-09-08 ENCOUNTER — Ambulatory Visit: Payer: Medicaid Other | Admitting: Obstetrics and Gynecology

## 2016-10-11 ENCOUNTER — Ambulatory Visit: Payer: Medicaid Other | Admitting: Medical

## 2016-10-23 ENCOUNTER — Ambulatory Visit: Payer: Medicaid Other | Admitting: Advanced Practice Midwife

## 2016-10-25 ENCOUNTER — Encounter (HOSPITAL_COMMUNITY): Payer: Self-pay

## 2016-10-25 ENCOUNTER — Inpatient Hospital Stay (HOSPITAL_COMMUNITY)
Admission: AD | Admit: 2016-10-25 | Discharge: 2016-10-26 | Disposition: A | Discharge status: Home / Self Care | Payer: Medicaid Other | Source: Ambulatory Visit | Attending: Obstetrics & Gynecology | Admitting: Obstetrics & Gynecology

## 2016-10-25 DIAGNOSIS — Z79899 Other long term (current) drug therapy: Secondary | ICD-10-CM | POA: Diagnosis not present

## 2016-10-25 DIAGNOSIS — F17210 Nicotine dependence, cigarettes, uncomplicated: Secondary | ICD-10-CM | POA: Insufficient documentation

## 2016-10-25 DIAGNOSIS — N939 Abnormal uterine and vaginal bleeding, unspecified: Secondary | ICD-10-CM | POA: Insufficient documentation

## 2016-10-25 DIAGNOSIS — B9689 Other specified bacterial agents as the cause of diseases classified elsewhere: Secondary | ICD-10-CM | POA: Insufficient documentation

## 2016-10-25 DIAGNOSIS — N760 Acute vaginitis: Secondary | ICD-10-CM | POA: Diagnosis not present

## 2016-10-25 DIAGNOSIS — Z30013 Encounter for initial prescription of injectable contraceptive: Secondary | ICD-10-CM

## 2016-10-25 LAB — URINALYSIS, ROUTINE W REFLEX MICROSCOPIC
Bilirubin Urine: NEGATIVE
Glucose, UA: NEGATIVE mg/dL
HGB URINE DIPSTICK: NEGATIVE
KETONES UR: NEGATIVE mg/dL
Nitrite: NEGATIVE
PROTEIN: NEGATIVE mg/dL
Specific Gravity, Urine: 1.023 (ref 1.005–1.030)
pH: 7 (ref 5.0–8.0)

## 2016-10-25 LAB — POCT PREGNANCY, URINE: Preg Test, Ur: NEGATIVE

## 2016-10-25 MED ORDER — MEDROXYPROGESTERONE ACETATE 150 MG/ML IM SUSP
150.0000 mg | Freq: Once | INTRAMUSCULAR | Status: AC
  Administered 2016-10-26: 150 mg via INTRAMUSCULAR
  Filled 2016-10-25: qty 1

## 2016-10-25 NOTE — MAU Note (Signed)
Pt presents stating she has been spotting for a week and an odor to her urine. LMP sometime last week. Has not taken pregnancy test. Also having ear fullness for 2 years. Missed appointment today for Depo injection. Denies pain.

## 2016-10-25 NOTE — MAU Provider Note (Signed)
History     CSN: [id number]  Arrival date and time: 10/25/16 2307   First Provider Initiated Contact with Patient 10/25/16 2353      Chief Complaint  Patient presents with  . Vaginal Bleeding   Theresa [name] is a 25 y.o. [id number] who presents today with vaginal discharge. She had an appoitnmetn for Depo today, but she missed her appointment.    Vaginal Bleeding  The patient's primary symptoms include genital itching, pelvic pain, vaginal bleeding and vaginal discharge. This is a new problem. The current episode started in the past 7 days. The problem occurs intermittently. The problem has been unchanged. Pain severity now: 7/10  The problem affects both sides. She is not pregnant. The vaginal discharge was watery and malodorous. The vaginal bleeding is spotting. She has not been passing clots. She has not been passing tissue. Nothing aggravates the symptoms. She has tried nothing for the symptoms. She is sexually active. She uses progestin injections for contraception. Her menstrual history has been regular (LMP: approx 10/06/16 ).   Past Medical History:  Diagnosis Date  . Chlamydia   . Depression   . Eczema   . Gonorrhea   . Pyelonephritis    X 2    Past Surgical History:  Procedure Laterality Date  . NO PAST SURGERIES      Family History  Problem Relation Age of Onset  . Birth defects Neg Hx   . Diabetes Neg Hx   . Stroke Neg Hx   . Hypertension Neg Hx   . Anesthesia problems Neg Hx   . Hypotension Neg Hx   . Malignant hyperthermia Neg Hx   . Pseudochol deficiency Neg Hx   . Asthma Daughter   . Williams syndrome Son     Social History  Substance Use Topics  . Smoking status: Current Every Day Smoker    Packs/day: 0.50    Types: Cigarettes  . Smokeless tobacco: Never Used  . Alcohol use No     Comment: occas.    Allergies:  Allergies  Allergen Reactions  . Latex Itching and Swelling    Prescriptions Prior to Admission  Medication Sig Dispense Refill  Last Dose  . docusate sodium (COLACE) 100 MG capsule Take 1 capsule (100 mg total) by mouth 2 (two) times daily. 30 capsule 0   . ibuprofen (ADVIL,MOTRIN) 600 MG tablet Take 1 tablet (600 mg total) by mouth every 6 (six) hours as needed for mild pain or moderate pain. 30 tablet 0   . Prenatal Vit-Min-FA-Fish Oil ([employer]S PRENATAL GUMMY PO) Take 1 tablet by mouth 3 (three) times daily.    Past Week at Unknown time    Review of Systems  Genitourinary: Positive for pelvic pain, vaginal bleeding and vaginal discharge.   Physical Exam   Blood pressure 96/69, pulse (!) 133, temperature 98 F (36.7 C), temperature source Oral, resp. rate 18, height 5' (1.524 m), weight 129 lb 3.2 oz (58.6 kg), last menstrual period 10/16/2016, unknown if currently breastfeeding.  Physical Exam  Nursing note and vitals reviewed. Constitutional: She is oriented to person, place, and time. She appears well-developed and well-nourished. No distress.  HENT:  Head: Normocephalic.  Cardiovascular: Normal rate.   Respiratory: Effort normal.  GI: Soft. There is no tenderness. There is no rebound.  Neurological: She is alert and oriented to person, place, and time.  Skin: Skin is warm and dry.  Psychiatric: She has a normal mood and affect.     Results for  orders placed or performed during the hospital encounter of 10/25/16 (from the past 24 hour(s))  Urinalysis, Routine w reflex microscopic     Status: Abnormal   Collection Time: 10/25/16 11:20 PM  Result Value Ref Range   Color, Urine YELLOW YELLOW   APPearance CLEAR CLEAR   Specific Gravity, Urine 1.023 1.005 - 1.030   pH 7.0 5.0 - 8.0   Glucose, UA NEGATIVE NEGATIVE mg/dL   Hgb urine dipstick NEGATIVE NEGATIVE   Bilirubin Urine NEGATIVE NEGATIVE   Ketones, ur NEGATIVE NEGATIVE mg/dL   Protein, ur NEGATIVE NEGATIVE mg/dL   Nitrite NEGATIVE NEGATIVE   Leukocytes, UA TRACE (A) NEGATIVE   RBC / HPF 0-5 0 - 5 RBC/hpf   WBC, UA 6-30 0 - 5 WBC/hpf   Bacteria, UA  RARE (A) NONE SEEN   Squamous Epithelial / LPF 6-30 (A) NONE SEEN   Mucous PRESENT   Pregnancy, urine POC     Status: None   Collection Time: 10/25/16 11:38 PM  Result Value Ref Range   Preg Test, Ur NEGATIVE NEGATIVE  Wet prep, genital     Status: Abnormal   Collection Time: 10/26/16 12:08 AM  Result Value Ref Range   Yeast Wet Prep HPF POC NONE SEEN NONE SEEN   Trich, Wet Prep NONE SEEN NONE SEEN   Clue Cells Wet Prep HPF POC PRESENT (A) NONE SEEN   WBC, Wet Prep HPF POC MODERATE (A) NONE SEEN   Sperm NONE SEEN     MAU Course  Procedures  MDM Patient given depo here in MAU today  Assessment and Plan   1. Bacterial vaginosis   2. Encounter for initial prescription of injectable contraceptive    DC home Comfort measures reviewed  RX: flagyl 500mg  BID x 7 days  Return to MAU as needed FU with OB as planned  [facility]-up Information    [facility]   Why:  You are due for your next depo shot on March 8th. Please call the Health department to schedule.  Contact information: [address] [place] [address] [phone]            Tawnya CrookHogan, Heather Donovan 10/25/2016, 11:54 PM

## 2016-10-26 DIAGNOSIS — B9689 Other specified bacterial agents as the cause of diseases classified elsewhere: Secondary | ICD-10-CM

## 2016-10-26 DIAGNOSIS — N760 Acute vaginitis: Secondary | ICD-10-CM

## 2016-10-26 LAB — GC/CHLAMYDIA PROBE AMP (~~LOC~~) NOT AT ARMC
Chlamydia: NEGATIVE
NEISSERIA GONORRHEA: NEGATIVE

## 2016-10-26 LAB — WET PREP, GENITAL
SPERM: NONE SEEN
Trich, Wet Prep: NONE SEEN
YEAST WET PREP: NONE SEEN

## 2016-10-26 MED ORDER — METRONIDAZOLE 500 MG PO TABS: 500.0000 mg | ORAL_TABLET | Freq: Two times a day (BID) | ORAL | 0 refills | Status: DC

## 2016-10-26 NOTE — Discharge Instructions (Signed)
Bacterial Vaginosis Bacterial vaginosis is an infection of the vagina. It happens when too many germs (bacteria) grow in the vagina. This infection puts you at risk for infections from sex (STIs). Treating this infection can lower your risk for some STIs. You should also treat this if you are pregnant. It can cause your baby to be born early. Follow these instructions at home: Medicines  Take over-the-counter and prescription medicines only as told by your doctor.  Take or use your antibiotic medicine as told by your doctor. Do not stop taking or using it even if you start to feel better. General instructions  If you your sexual partner is a woman, tell her that you have this infection. She needs to get treatment if she has symptoms. If you have a female partner, he does not need to be treated.  During treatment:  Avoid sex.  Do not douche.  Avoid alcohol as told.  Avoid breastfeeding as told.  Drink enough fluid to keep your pee (urine) clear or pale yellow.  Keep your vagina and butt (rectum) clean.  Wash the area with warm water every day.  Wipe from front to back after you use the toilet.  Keep all follow-up visits as told by your doctor. This is important. Preventing this condition  Do not douche.  Use only warm water to wash around your vagina.  Use protection when you have sex. This includes:  Latex condoms.  Dental dams.  Limit how many people you have sex with. It is best to only have sex with the same person (be monogamous).  Get tested for STIs. Have your partner get tested.  Wear underwear that is cotton or lined with cotton.  Avoid tight pants and pantyhose. This is most important in summer.  Do not use any products that have nicotine or tobacco in them. These include cigarettes and e-cigarettes. If you need help quitting, ask your doctor.  Do not use illegal drugs.  Limit how much alcohol you drink. Contact a doctor if:  Your symptoms do not get  better, even after you are treated.  You have more discharge or pain when you pee (urinate).  You have a fever.  You have pain in your belly (abdomen).  You have pain with sex.  Your bleed from your vagina between periods. Summary  This infection happens when too many germs (bacteria) grow in the vagina.  Treating this condition can lower your risk for some infections from sex (STIs).  You should also treat this if you are pregnant. It can cause early (premature) birth.  Do not stop taking or using your antibiotic medicine even if you start to feel better. This information is not intended to replace advice given to you by your health care provider. Make sure you discuss any questions you have with your health care provider. Document Released: 08/01/2008 Document Revised: 07/08/2016 Document Reviewed: 07/08/2016 Elsevier Interactive Patient Education  2017 [employer] Inc[employer] Hormonal Contraception Information Introduction Estrogen and progesterone (progestin) are hormones used in many forms of birth control (contraception). These two hormones make up most hormonal contraceptives. Hormonal contraceptives use either:  A combination of estrogen hormone and progesterone hormone in one of these forms:  Pill. Pills come in various combinations of active hormone pills and nonhormonal pills. Different combinations of pills may give you a period once a month, once every 3 months, or no period at all. It is important to take the pills the same time each day.  Patch. The patch  is placed on the lower abdomen every week for 3 weeks. On the fourth week, the patch is not placed.  Vaginal ring. The ring is placed in the vagina and left there for 3 weeks. It is then removed for 1 week.  Progesterone alone in one of these forms:  Pill. Hormone pills are taken every day of the cycle.  Intrauterine device (IUD). The IUD is inserted during a menstrual period and removed or replaced every 5 years or  sooner.  Implant. Plastic rods are placed under the skin of the upper arm. They are removed or replaced every 3 years or sooner.  Injection. The injection is given once every 90 days. Pregnancy can still occur with any of these hormonal contraceptive methods. If you have any suspicion that you might be pregnant, take a pregnancy test and talk to your health care provider. Estrogen and progesterone contraceptives Estrogen and progesterone contraceptives can prevent pregnancy by:  Stopping the release of an egg (ovulation).  Thickening the mucus of the cervix, making it difficult for sperm to enter the uterus.  Changing the lining of the uterus. This change makes it more difficult for an egg to implant. Progesterone contraceptives Progesterone-only contraceptives can prevent pregnancy by:  Blocking ovulation. This occurs in many women, but some women will continue to ovulate.  Preventing the entry of sperm into the uterus by keeping the cervical mucus thick and sticky.  Changing the lining of the uterus. This change makes it more difficult for an egg to implant. Side effects Talk to your health care provider about what side effects may affect you. If you develop persistent side effects or if the effects are severe, talk to your health care provider.  Estrogen. Side effects from estrogen occur more often in the first 2-3 months. They include:  Progesterone. Side effects of progesterone can vary. They include: Questions to ask This information is not intended to replace advice given to you by your health care provider. Make sure you discuss any questions you have with your health care provider. Document Released: 11/12/2007 Document Revised: 07/26/2016 Document Reviewed: 04/06/2013  2017 Elsevier

## 2016-10-27 LAB — URINE CULTURE

## 2016-11-02 ENCOUNTER — Encounter (HOSPITAL_COMMUNITY): Payer: Self-pay

## 2016-11-02 ENCOUNTER — Emergency Department (HOSPITAL_COMMUNITY)
Admission: EM | Admit: 2016-11-02 | Discharge: 2016-11-02 | Disposition: A | Discharge status: Home / Self Care | Payer: Medicaid Other | Attending: Emergency Medicine | Admitting: Emergency Medicine

## 2016-11-02 DIAGNOSIS — R52 Pain, unspecified: Secondary | ICD-10-CM | POA: Insufficient documentation

## 2016-11-02 DIAGNOSIS — Z5321 Procedure and treatment not carried out due to patient leaving prior to being seen by health care provider: Secondary | ICD-10-CM | POA: Diagnosis not present

## 2016-11-02 DIAGNOSIS — Z79899 Other long term (current) drug therapy: Secondary | ICD-10-CM | POA: Insufficient documentation

## 2016-11-02 DIAGNOSIS — F17210 Nicotine dependence, cigarettes, uncomplicated: Secondary | ICD-10-CM | POA: Insufficient documentation

## 2016-11-02 DIAGNOSIS — R197 Diarrhea, unspecified: Secondary | ICD-10-CM | POA: Diagnosis not present

## 2016-11-02 LAB — COMPREHENSIVE METABOLIC PANEL
ALBUMIN: 3.5 g/dL (ref 3.5–5.0)
ALK PHOS: 73 U/L (ref 38–126)
ALT: 19 U/L (ref 14–54)
ANION GAP: 9 (ref 5–15)
AST: 25 U/L (ref 15–41)
BILIRUBIN TOTAL: 0.5 mg/dL (ref 0.3–1.2)
BUN: 9 mg/dL (ref 6–20)
CALCIUM: 8.8 mg/dL — AB (ref 8.9–10.3)
CO2: 22 mmol/L (ref 22–32)
Chloride: 104 mmol/L (ref 101–111)
Creatinine, Ser: 0.82 mg/dL (ref 0.44–1.00)
GFR calc Af Amer: >60 mL/min (ref >60)
GFR calc non Af Amer: >60 mL/min (ref >60)
GLUCOSE: 90 mg/dL (ref 65–99)
POTASSIUM: 3.8 mmol/L (ref 3.5–5.1)
SODIUM: 135 mmol/L (ref 135–145)
Total Protein: 7.7 g/dL (ref 6.5–8.1)

## 2016-11-02 LAB — CBC
HEMATOCRIT: 38.4 % (ref 36.0–46.0)
Hemoglobin: 12.9 g/dL (ref 12.0–15.0)
MCH: 28 pg (ref 26.0–34.0)
MCHC: 33.6 g/dL (ref 30.0–36.0)
MCV: 83.3 fL (ref 78.0–100.0)
Platelets: 217 10*3/uL (ref 150–400)
RBC: 4.61 MIL/uL (ref 3.87–5.11)
RDW: 13.9 % (ref 11.5–15.5)
WBC: 5 10*3/uL (ref 4.0–10.5)

## 2016-11-02 LAB — LIPASE, BLOOD: Lipase: 24 U/L (ref 11–51)

## 2016-11-02 NOTE — ED Notes (Signed)
Called for vital sign reassessment. No answer.  

## 2016-11-02 NOTE — ED Notes (Signed)
Called pt to take to room. No answer 

## 2016-11-02 NOTE — ED Triage Notes (Signed)
Called for triage X2 no response.

## 2016-11-02 NOTE — ED Triage Notes (Signed)
Pt reports diarrhea and generalized body aches X2 days. She also reports that she has a headache. Pt also reports episodes of sweating perfusely.

## 2016-11-02 NOTE — ED Notes (Signed)
Call for room, no answer

## 2016-11-02 NOTE — ED Notes (Signed)
Pt called for room x3. Pt found in pediatric waiting area. Pt stated she needed to go home to her child and did not want to be seen anymore.

## 2017-01-04 ENCOUNTER — Telehealth: Payer: Self-pay | Admitting: Obstetrics and Gynecology

## 2017-01-04 ENCOUNTER — Ambulatory Visit (INDEPENDENT_AMBULATORY_CARE_PROVIDER_SITE_OTHER): Payer: Medicaid Other | Admitting: Obstetrics and Gynecology

## 2017-01-04 ENCOUNTER — Encounter: Payer: Self-pay | Admitting: Obstetrics and Gynecology

## 2017-01-04 ENCOUNTER — Other Ambulatory Visit (HOSPITAL_COMMUNITY)
Admission: RE | Admit: 2017-01-04 | Discharge: 2017-01-04 | Disposition: A | Discharge status: Home / Self Care | Payer: Medicaid Other | Source: Ambulatory Visit | Attending: Obstetrics and Gynecology | Admitting: Obstetrics and Gynecology

## 2017-01-04 VITALS — BP 104/60 | HR 72 | Wt 135.7 lb

## 2017-01-04 DIAGNOSIS — Z3042 Encounter for surveillance of injectable contraceptive: Secondary | ICD-10-CM

## 2017-01-04 DIAGNOSIS — Z01419 Encounter for gynecological examination (general) (routine) without abnormal findings: Secondary | ICD-10-CM | POA: Insufficient documentation

## 2017-01-04 DIAGNOSIS — Z0000 Encounter for general adult medical examination without abnormal findings: Secondary | ICD-10-CM | POA: Diagnosis present

## 2017-01-04 DIAGNOSIS — Z113 Encounter for screening for infections with a predominantly sexual mode of transmission: Secondary | ICD-10-CM | POA: Diagnosis present

## 2017-01-04 LAB — POCT URINALYSIS DIP (DEVICE)
Bilirubin Urine: NEGATIVE
GLUCOSE, UA: NEGATIVE mg/dL
Hgb urine dipstick: NEGATIVE
KETONES UR: NEGATIVE mg/dL
Nitrite: NEGATIVE
PH: 7 (ref 5.0–8.0)
PROTEIN: NEGATIVE mg/dL
Specific Gravity, Urine: 1.015 (ref 1.005–1.030)
UROBILINOGEN UA: 1 mg/dL (ref 0.0–1.0)

## 2017-01-04 MED ORDER — MEDROXYPROGESTERONE ACETATE 150 MG/ML IM SUSP: 150.0000 mg | INTRAMUSCULAR | 0 refills | Status: DC

## 2017-01-04 MED ORDER — MEDROXYPROGESTERONE ACETATE 150 MG/ML IM SUSP
150.0000 mg | Freq: Once | INTRAMUSCULAR | Status: AC
  Administered 2017-01-04: 150 mg via INTRAMUSCULAR

## 2017-01-04 NOTE — Patient Instructions (Signed)
Human Papillomavirus Human papillomavirus (HPV) is the most common sexually transmitted infection (STI). It is easy to pass it from person to person (contagious). HPV can cause cervical cancer, anal cancer, and genital warts. The genital warts can be seen and felt. Also, there may be wartlike regions in the throat. HPV may not have any symptoms. It is possible to have HPV for a long time and not know it. You may pass HPV on to others without knowing it. Follow these instructions at home:  Take medicines as told by your doctor.  Use over-the-counter creams for itching as told by your doctor.  Keep all follow-up visits. Make sure to get Pap tests as told by your doctor.  Do not touch or scratch the warts.  Do not treat genital warts with medicines used for treating hand warts.  Do not have sex while you are getting treatment.  Do not douche or use tampons during treatment of HPV.  Tell your sex partner about your infection because he or she may also need treatment.  If you get pregnant, tell your doctor that you had HPV. Your doctor will watch your pregnancy closely. This is important to keep your baby safe.  After treatment, use condoms during sex to prevent future infections.  Have only one sex partner.  Have a sex partner who does not have other sex partners. Contact a doctor if:  The treated skin is red, swollen, or painful.  You have a fever.  You feel ill.  You feel lumps or pimple-like areas in and around your genital area.  You have bleeding of the vagina or the area that was treated.  You have pain during sex. This information is not intended to replace advice given to you by your health care provider. Make sure you discuss any questions you have with your health care provider. Document Released: 10/05/2008 Document Revised: 03/30/2016 Document Reviewed: 01/28/2014 Elsevier Interactive Patient Education  2017 [employer]vier Inc.  

## 2017-01-04 NOTE — Progress Notes (Signed)
Subjective:    Patient ID: Theresa [name], female    DOB: 05-04-1991, 26 y.o.   MRN: [id number]  26 y/o female with no significant medical history presents today for well exam and depo shot. She is complaining for some occasional buring with urination, no frequency or urgency. She is also concerned she might have a year infection. She reports some genital itching. Last pap was in 2105 and was negative.  She reports irregular bleeding on her Deop provera. She started depo 3 months ago. She is sexually active does not use protection. She does have anal as well as penetrative vaginal sex.  She has 1 partner when she has been monogamous with for several years.      Review of Systems  Constitutional: Negative for activity change, appetite change, chills and fever.  HENT: Negative for congestion and rhinorrhea.   Respiratory: Negative for cough, choking and shortness of breath.   Cardiovascular: Negative for chest pain and leg swelling.  Gastrointestinal: Negative for abdominal distention, abdominal pain, constipation, diarrhea, nausea and vomiting.  Genitourinary: Positive for dysuria and frequency. Negative for difficulty urinating, flank pain and hematuria.  Musculoskeletal: Negative for arthralgias, back pain and myalgias.  Skin: Negative for pallor and rash.  Allergic/Immunologic: Negative for environmental allergies and food allergies.  Neurological: Negative for dizziness and weakness.  Psychiatric/Behavioral: Negative for agitation and suicidal ideas.   Family History  Problem Relation Age of Onset  . Asthma Daughter   . Williams syndrome Son   . Birth defects Neg Hx   . Diabetes Neg Hx   . Stroke Neg Hx   . Hypertension Neg Hx   . Anesthesia problems Neg Hx   . Hypotension Neg Hx   . Malignant hyperthermia Neg Hx   . Pseudochol deficiency Neg Hx    Social History   Social History  . Marital status: Single    Spouse name: N/A  . Number of children: N/A  . Years of  education: N/A   Occupational History  . Not on file.   Social History Main Topics  . Smoking status: Current Every Day Smoker    Packs/day: 0.50    Types: Cigarettes  . Smokeless tobacco: Never Used  . Alcohol use No     Comment: occas.  . Drug use: No     Comment: last use one week ago  . Sexual activity: Yes    Birth control/ protection: Condom   Other Topics Concern  . Not on file   Social History Narrative  . No narrative on file   Past Surgical History:  Procedure Laterality Date  . NO PAST SURGERIES          Objective:   Physical Exam  Constitutional: She is oriented to person, place, and time. She appears well-developed and well-nourished.  HENT:  Head: Normocephalic and atraumatic.  Eyes: Conjunctivae and EOM are normal. Pupils are equal, round, and reactive to light.  Neck: Normal range of motion. Neck supple.  Cardiovascular: Normal rate, regular rhythm, normal heart sounds and intact distal pulses.  Exam reveals no friction rub.   No murmur heard. Pulmonary/Chest: Effort normal and breath sounds normal. No respiratory distress. She has no wheezes.  Abdominal: Soft. Bowel sounds are normal. She exhibits no distension and no mass. There is no tenderness. There is no rebound and no guarding.  Genitourinary:  Genitourinary Comments: Cervix with some mild irritation, copious thin white discharge in the vaginal vault significantly posterior cervix no cervical motion  tenderness, vaginal mucosa normal  Musculoskeletal: Normal range of motion. She exhibits no edema or tenderness.  Neurological: She is alert and oriented to person, place, and time. No cranial nerve deficit. Coordination normal.  Skin: Skin is warm and dry. No erythema.  Psychiatric: She has a normal mood and affect. Her behavior is normal.          Assessment & Plan:  #1 dysuria: UA normal,  consider yeast or BV as below #2 Vaginal discharge: Check for GC/CT, BV. Yeast, Trich today #3 Well  woman exam: uptodate on screenings aside from PAP due today. Ordered and collected.

## 2017-01-05 LAB — CERVICOVAGINAL ANCILLARY ONLY
BACTERIAL VAGINITIS: POSITIVE — AB
CANDIDA VAGINITIS: POSITIVE — AB
CHLAMYDIA, DNA PROBE: NEGATIVE
NEISSERIA GONORRHEA: NEGATIVE
TRICH (WINDOWPATH): NEGATIVE

## 2017-01-08 LAB — CYTOLOGY - PAP: DIAGNOSIS: NEGATIVE

## 2017-01-08 MED ORDER — MICONAZOLE NITRATE 2 % EX CREA: 1.0000 "application " | TOPICAL_CREAM | Freq: Two times a day (BID) | CUTANEOUS | 0 refills | Status: DC

## 2017-01-08 MED ORDER — METRONIDAZOLE 0.75 % VA GEL: 1.0000 | Freq: Every day | VAGINAL | 0 refills | Status: AC

## 2017-01-08 NOTE — Telephone Encounter (Signed)
Patient contact and instructed she has BV and yeast. Given miconazole and metrogel per patients request. Sent to [facility] on [address]

## 2017-02-18 ENCOUNTER — Inpatient Hospital Stay (HOSPITAL_COMMUNITY): Payer: Medicaid Other

## 2017-02-18 ENCOUNTER — Encounter (HOSPITAL_COMMUNITY): Payer: Self-pay | Admitting: *Deleted

## 2017-02-18 ENCOUNTER — Inpatient Hospital Stay (HOSPITAL_COMMUNITY)
Admission: AD | Admit: 2017-02-18 | Discharge: 2017-02-18 | Disposition: A | Discharge status: Home / Self Care | Payer: Medicaid Other | Source: Ambulatory Visit | Attending: Obstetrics & Gynecology | Admitting: Obstetrics & Gynecology

## 2017-02-18 DIAGNOSIS — O4402 Placenta previa specified as without hemorrhage, second trimester: Secondary | ICD-10-CM | POA: Diagnosis not present

## 2017-02-18 DIAGNOSIS — O99332 Smoking (tobacco) complicating pregnancy, second trimester: Secondary | ICD-10-CM | POA: Insufficient documentation

## 2017-02-18 DIAGNOSIS — Z3A20 20 weeks gestation of pregnancy: Secondary | ICD-10-CM | POA: Diagnosis not present

## 2017-02-18 DIAGNOSIS — R102 Pelvic and perineal pain: Secondary | ICD-10-CM | POA: Diagnosis present

## 2017-02-18 DIAGNOSIS — O26899 Other specified pregnancy related conditions, unspecified trimester: Secondary | ICD-10-CM | POA: Insufficient documentation

## 2017-02-18 LAB — URINALYSIS, MICROSCOPIC (REFLEX)

## 2017-02-18 LAB — URINALYSIS, ROUTINE W REFLEX MICROSCOPIC
Bilirubin Urine: NEGATIVE
Glucose, UA: NEGATIVE mg/dL
Hgb urine dipstick: NEGATIVE
KETONES UR: NEGATIVE mg/dL
NITRITE: POSITIVE — AB
PH: 6 (ref 5.0–8.0)
Protein, ur: NEGATIVE mg/dL
SPECIFIC GRAVITY, URINE: 1.01 (ref 1.005–1.030)

## 2017-02-18 MED ORDER — PRENATAL ADULT GUMMY/DHA/FA 0.4-25 MG PO CHEW: 1.0000 | CHEWABLE_TABLET | Freq: Once | ORAL | 11 refills | Status: AC

## 2017-02-18 NOTE — MAU Provider Note (Signed)
History   [id number] in with c/o preg symptoms and stomach swelling. LMP 10/16/16 got DMPA on 10/25/16 second dose of DMPA on 01/20/17.  CSN: [id number]  Arrival date & time 02/18/17  1310   None     Chief Complaint  Patient presents with  . Abdominal Pain    HPI  Past Medical History:  Diagnosis Date  . Chlamydia   . Depression   . Eczema   . Gonorrhea   . Pyelonephritis    X 2    Past Surgical History:  Procedure Laterality Date  . NO PAST SURGERIES      Family History  Problem Relation Age of Onset  . Asthma Daughter   . Williams syndrome Son   . Birth defects Neg Hx   . Diabetes Neg Hx   . Stroke Neg Hx   . Hypertension Neg Hx   . Anesthesia problems Neg Hx   . Hypotension Neg Hx   . Malignant hyperthermia Neg Hx   . Pseudochol deficiency Neg Hx     Social History  Substance Use Topics  . Smoking status: Current Every Day Smoker    Packs/day: 0.50    Types: Cigarettes  . Smokeless tobacco: Never Used  . Alcohol use No     Comment: occas.    OB History    Gravida Para Term Preterm AB Living   SAB TAB Ectopic Multiple Live Births         0 4      Review of Systems  Constitutional: Negative.   HENT: Negative.   Eyes: Negative.   Respiratory: Negative.   Cardiovascular: Negative.   Gastrointestinal: Positive for abdominal pain.  Endocrine: Negative.   Genitourinary: Negative.   Skin: Negative.   Allergic/Immunologic: Negative.   Neurological: Negative.   Hematological: Negative.   Psychiatric/Behavioral: Negative.     Allergies  Latex  Home Medications    BP 135/78   Pulse 85   Temp 97.8 F (36.6 C)   Resp 18   Ht  (1.6 m)   Wt 147 lb (66.7 kg)   LMP 10/16/2016 (Within Weeks)   BMI 26.04 kg/m   Physical Exam  Constitutional: She is oriented to person, place, and time. She appears well-developed and well-nourished.  HENT:  Head: Normocephalic.  Eyes: Pupils are equal, round, and reactive to light.  Neck:  Normal range of motion.  Cardiovascular: Normal rate.   Pulmonary/Chest: Effort normal.  Abdominal: Soft. Bowel sounds are normal.  Musculoskeletal: Normal range of motion.  Neurological: She is alert and oriented to person, place, and time. She has normal reflexes.  Skin: Skin is warm and dry.  Psychiatric: She has a normal mood and affect. Her behavior is normal. Judgment and thought content normal.    MAU Course  Procedures (including critical care time)  Labs Reviewed  URINALYSIS, ROUTINE W REFLEX MICROSCOPIC   No results found.   No diagnosis found.    MDM   [id number] in with c/o preg symptoms and stomach swelling. LMP 10/16/16 got DMPA on 10/25/16 second dose of DMPA on 01/20/17. FHR 144 st and reg. Fundal height consistant with 18 wks-20 wks.  US shows IUP at 20.2 wks with complete previa and possible vasa previa. Lengthy  Conversation with pt regarding options. She states she is going to keep pregnancy. I instructed her no sex nothing in vagina. Will message clinic to get pt in ASAP for high  rist and MFM eval. D/C home. POC discussed with Dr. Penne Lash.

## 2017-02-18 NOTE — Discharge Instructions (Signed)
Placenta Previa Placenta previa is a condition in which the placenta implants in the lower part of the uterus in pregnant women. The placenta either partially or completely covers the opening to the cervix. This is a problem because the baby must pass through the cervix during delivery. There are three types of placenta previa:  Marginal placenta previa. The placenta reaches within an inch (2.5 cm) of the cervical opening but does not cover it.  Partial placenta previa. The placenta covers part of the cervical opening.  Complete placenta previa. The placenta covers the entire cervical opening. If the previa is marginal or partial and it is diagnosed in the first half of pregnancy, the placenta may move into a normal position as the pregnancy progresses and may no longer cover the cervix. It is important to keep all prenatal visits with your health care provider so you can be more closely monitored. What are the causes? The cause of this condition is not known. What increases the risk? This condition is more likely to develop in women who:  Are carrying more than one baby (multiples).  Have an abnormally shaped uterus.  Have scars on the lining of the uterus.  Have had surgeries involving the uterus, such as a cesarean delivery.  Have delivered a baby before.  Have a history of placenta previa.  Have smoked or used cocaine during pregnancy.  Are age 35 or older during pregnancy. What are the signs or symptoms? The main symptom of this condition is sudden, painless vaginal bleeding during the second half of pregnancy. The amount of bleeding can be very light at first, and it usually stops on its own. Heavier bleeding episodes may also happen. Some women with placenta previa may have no bleeding at all. How is this diagnosed?  This condition is diagnosed:  From an ultrasound. This test uses sound waves to find where the placenta is located before you have any bleeding  episodes.  During a checkup after vaginal bleeding is noticed.  If you are diagnosed with a partial or complete previa, digital exams with fingers will generally be avoided. Your health care provider will still perform a speculum exam.  If you did not have an ultrasound during your pregnancy, placenta previa may not be diagnosed until bleeding occurs during labor. How is this treated? Treatment for this condition may include:  Decreased activity.  Bed rest at home or in the hospital.  Pelvic rest. Nothing is placed inside the vagina during pelvic rest. This means not having sex and not using tampons or douches.  A blood transfusion to replace blood that you have lost (maternal blood loss).  A cesarean delivery. This may be performed if:  The bleeding is heavy and cannot be controlled.  The placenta completely covers the cervix.  Medicines to stop premature labor or to help the baby's lungs to mature. This treatment may be used if you need delivery before your pregnancy is full-term. Your treatment will be decided based on:  How much you are bleeding, or whether the bleeding has stopped.  How far along you are in your pregnancy.  The condition of your baby.  The type of placenta previa that you have. Follow these instructions at home:  Get plenty of rest and lessen activity as told by your health care provider.  Stay on bed rest for as long as told by your health care provider.  Do not have sex, use tampons, use a douche, or place anything inside of your   vagina if your health care provider recommended pelvic rest.  Take over-the-counter and prescription medicines as told by your health care provider.  Keep all follow-up visits as told by your health care provider. This is important. Get help right away if:  You have vaginal bleeding, even if in small amounts and even if you have no pain.  You have cramping or regular contractions.  You have pain in your abdomen or  your lower back.  You have a feeling of increased pressure in your pelvis.  You have increased watery or bloody mucus from the vagina. This information is not intended to replace advice given to you by your health care provider. Make sure you discuss any questions you have with your health care provider. Document Released: 10/23/2005 Document Revised: 07/12/2016 Document Reviewed: 05/06/2016 Elsevier Interactive Patient Education  2017 [employer] Inc[employer]  

## 2017-02-18 NOTE — MAU Note (Signed)
Pt presents to MAU with complaints of lower abdominal cramping that started a couple of months ago. Denies any vaginal bleeding. PT had negative pregnancy test in Dec and had depo shot here in MAU . Pt had 2nd depo shot she states here in the clinic around the middle of march.

## 2017-02-26 ENCOUNTER — Ambulatory Visit (INDEPENDENT_AMBULATORY_CARE_PROVIDER_SITE_OTHER): Payer: Medicaid Other | Admitting: Family Medicine

## 2017-02-26 ENCOUNTER — Encounter: Payer: Self-pay | Admitting: Family Medicine

## 2017-02-26 ENCOUNTER — Other Ambulatory Visit (HOSPITAL_COMMUNITY)
Admission: RE | Admit: 2017-02-26 | Discharge: 2017-02-26 | Disposition: A | Discharge status: Home / Self Care | Payer: Medicaid Other | Source: Ambulatory Visit | Attending: Family Medicine | Admitting: Family Medicine

## 2017-02-26 VITALS — BP 97/58 | HR 87 | Wt 139.8 lb

## 2017-02-26 DIAGNOSIS — Z8489 Family history of other specified conditions: Secondary | ICD-10-CM | POA: Insufficient documentation

## 2017-02-26 DIAGNOSIS — F17210 Nicotine dependence, cigarettes, uncomplicated: Secondary | ICD-10-CM | POA: Diagnosis not present

## 2017-02-26 DIAGNOSIS — O0992 Supervision of high risk pregnancy, unspecified, second trimester: Secondary | ICD-10-CM | POA: Diagnosis present

## 2017-02-26 DIAGNOSIS — O2342 Unspecified infection of urinary tract in pregnancy, second trimester: Secondary | ICD-10-CM | POA: Diagnosis not present

## 2017-02-26 DIAGNOSIS — O4402 Placenta previa specified as without hemorrhage, second trimester: Secondary | ICD-10-CM | POA: Diagnosis not present

## 2017-02-26 LAB — POCT URINALYSIS DIP (DEVICE)
Bilirubin Urine: NEGATIVE
GLUCOSE, UA: NEGATIVE mg/dL
Hgb urine dipstick: NEGATIVE
KETONES UR: NEGATIVE mg/dL
Nitrite: POSITIVE — AB
PROTEIN: NEGATIVE mg/dL
Specific Gravity, Urine: 1.02 (ref 1.005–1.030)
UROBILINOGEN UA: 1 mg/dL (ref 0.0–1.0)
pH: 7 (ref 5.0–8.0)

## 2017-02-26 MED ORDER — NITROFURANTOIN MONOHYD MACRO 100 MG PO CAPS: 100.0000 mg | ORAL_CAPSULE | Freq: Two times a day (BID) | ORAL | 0 refills | Status: DC

## 2017-02-26 NOTE — Patient Instructions (Signed)
Placenta Previa Placenta previa is a condition in which the placenta implants in the lower part of the uterus in pregnant women. The placenta either partially or completely covers the opening to the cervix. This is a problem because the baby must pass through the cervix during delivery. There are three types of placenta previa:  Marginal placenta previa. The placenta reaches within an inch (2.5 cm) of the cervical opening but does not cover it.  Partial placenta previa. The placenta covers part of the cervical opening.  Complete placenta previa. The placenta covers the entire cervical opening. If the previa is marginal or partial and it is diagnosed in the first half of pregnancy, the placenta may move into a normal position as the pregnancy progresses and may no longer cover the cervix. It is important to keep all prenatal visits with your health care provider so you can be more closely monitored. What are the causes? The cause of this condition is not known. What increases the risk? This condition is more likely to develop in women who:  Are carrying more than one baby (multiples).  Have an abnormally shaped uterus.  Have scars on the lining of the uterus.  Have had surgeries involving the uterus, such as a cesarean delivery.  Have delivered a baby before.  Have a history of placenta previa.  Have smoked or used cocaine during pregnancy.  Are age 35 or older during pregnancy. What are the signs or symptoms? The main symptom of this condition is sudden, painless vaginal bleeding during the second half of pregnancy. The amount of bleeding can be very light at first, and it usually stops on its own. Heavier bleeding episodes may also happen. Some women with placenta previa may have no bleeding at all. How is this diagnosed?  This condition is diagnosed:  From an ultrasound. This test uses sound waves to find where the placenta is located before you have any bleeding  episodes.  During a checkup after vaginal bleeding is noticed.  If you are diagnosed with a partial or complete previa, digital exams with fingers will generally be avoided. Your health care provider will still perform a speculum exam.  If you did not have an ultrasound during your pregnancy, placenta previa may not be diagnosed until bleeding occurs during labor. How is this treated? Treatment for this condition may include:  Decreased activity.  Bed rest at home or in the hospital.  Pelvic rest. Nothing is placed inside the vagina during pelvic rest. This means not having sex and not using tampons or douches.  A blood transfusion to replace blood that you have lost (maternal blood loss).  A cesarean delivery. This may be performed if:  The bleeding is heavy and cannot be controlled.  The placenta completely covers the cervix.  Medicines to stop premature labor or to help the baby's lungs to mature. This treatment may be used if you need delivery before your pregnancy is full-term. Your treatment will be decided based on:  How much you are bleeding, or whether the bleeding has stopped.  How far along you are in your pregnancy.  The condition of your baby.  The type of placenta previa that you have. Follow these instructions at home:  Get plenty of rest and lessen activity as told by your health care provider.  Stay on bed rest for as long as told by your health care provider.  Do not have sex, use tampons, use a douche, or place anything inside of your   vagina if your health care provider recommended pelvic rest.  Take over-the-counter and prescription medicines as told by your health care provider.  Keep all follow-up visits as told by your health care provider. This is important. Get help right away if:  You have vaginal bleeding, even if in small amounts and even if you have no pain.  You have cramping or regular contractions.  You have pain in your abdomen or  your lower back.  You have a feeling of increased pressure in your pelvis.  You have increased watery or bloody mucus from the vagina. This information is not intended to replace advice given to you by your health care provider. Make sure you discuss any questions you have with your health care provider. Document Released: 10/23/2005 Document Revised: 07/12/2016 Document Reviewed: 05/06/2016 Elsevier Interactive Patient Education  2017 [employer]   Second Trimester of Pregnancy The second trimester is from week 13 through week 28, month 4 through 6. This is often the time in pregnancy that you feel your best. Often times, morning sickness has lessened or quit. You may have more energy, and you may get hungry more often. Your unborn baby (fetus) is growing rapidly. At the end of the sixth month, he or she is about 9 inches long and weighs about 1 pounds. You will likely feel the baby move (quickening) between 18 and 20 weeks of pregnancy. Follow these instructions at home:  Avoid all smoking, herbs, and alcohol. Avoid drugs not approved by your doctor.  Do not use any tobacco products, including cigarettes, chewing tobacco, and electronic cigarettes. If you need help quitting, ask your doctor. You may get counseling or other support to help you quit.  Only take medicine as told by your doctor. Some medicines are safe and some are not during pregnancy.  Exercise only as told by your doctor. Stop exercising if you start having cramps.  Eat regular, healthy meals.  Wear a good support bra if your breasts are tender.  Do not use hot tubs, steam rooms, or saunas.  Wear your seat belt when driving.  Avoid raw meat, uncooked cheese, and liter boxes and soil used by cats.  Take your prenatal vitamins.  Take 1500-2000 milligrams of calcium daily starting at the 20th week of pregnancy until you deliver your baby.  Try taking medicine that helps you poop (stool softener) as needed, and  if your doctor approves. Eat more fiber by eating fresh fruit, vegetables, and whole grains. Drink enough fluids to keep your pee (urine) clear or pale yellow.  Take warm water baths (sitz baths) to soothe pain or discomfort caused by hemorrhoids. Use hemorrhoid cream if your doctor approves.  If you have puffy, bulging veins (varicose veins), wear support hose. Raise (elevate) your feet for 15 minutes, 3-4 times a day. Limit salt in your diet.  Avoid heavy lifting, wear low heals, and sit up straight.  Rest with your legs raised if you have leg cramps or low back pain.  Visit your dentist if you have not gone during your pregnancy. Use a soft toothbrush to brush your teeth. Be gentle when you floss.  You can have sex (intercourse) unless your doctor tells you not to.  Go to your doctor visits. Get help if:  You feel dizzy.  You have mild cramps or pressure in your lower belly (abdomen).  You have a nagging pain in your belly area.  You continue to feel sick to your stomach (nauseous), throw up (vomit),  or have watery poop (diarrhea).  You have bad smelling fluid coming from your vagina.  You have pain with peeing (urination). Get help right away if:  You have a fever.  You are leaking fluid from your vagina.  You have spotting or bleeding from your vagina.  You have severe belly cramping or pain.  You lose or gain weight rapidly.  You have trouble catching your breath and have chest pain.  You notice sudden or extreme puffiness (swelling) of your face, hands, ankles, feet, or legs.  You have not felt the baby move in over an hour.  You have severe headaches that do not go away with medicine.  You have vision changes. This information is not intended to replace advice given to you by your health care provider. Make sure you discuss any questions you have with your health care provider. Document Released: 01/17/2010 Document Revised: 03/30/2016 Document Reviewed:  12/24/2012 Elsevier Interactive Patient Education  2017 [employer].

## 2017-02-26 NOTE — Progress Notes (Signed)
New OB Note  02/26/2017   CC:  Chief Complaint  Patient presents with  . Initial Prenatal Visit    Transfer of Care Patient: no  History of Present Illness: Theresa [name] is a 26 y.o. [id number] at [redacted]w[redacted]d by midtrimester ultrasound being seen today for her first obstetrical visit. Her obstetrical history is significant for smoker and COMPLETE POSTERIOR PLACENTA PREVIA (concern for vasa previa). Patient does not intend to breast feed. Patient plans to do PP BTL for contraception after completion of pregnancy. Pregnancy history fully reviewed.  Her periods were: regular periods every 28 days She was using Depo-Provera when she conceived, received 2 injections while pregnant, never missed doses.  She has Negative signs or symptoms of nausea/vomiting of pregnancy. She has Negative signs or symptoms of miscarriage or preterm labor She identifies Negative Zika risk factors for her and her partner  Patient reports no complaints.  Any prior children are healthy, doing well, without any problems or issues: no, third baby has [facility] SYNDROME.  Complications in prior pregnancies: no, all vaginal births.  Complications in prior deliveries: no, all vaginal deliveries, normal.    ROS: A 12-point review of systems was performed and negative, except as stated in the above HPI.  HISTORY:  OBGYN History: As per HPI. OB History  Gravida Para Term Preterm AB Living  SAB TAB Ectopic Multiple Live Births        0 4    # Outcome Date GA Lbr Len/2nd Weight Sex Delivery Anes PTL Lv  5 Current           4 Term 05/16/16 [redacted]w[redacted]d 02:17 / 00:11 7 lb 3.7 oz (3.28 kg) M Vag-Spont EPI  LIV     Birth Comments: wnl  3 Term 12/02/14 [redacted]w[redacted]d 05:36 / 00:12 5 lb 5.4 oz (2.421 kg) M Vag-Spont EPI  LIV     Birth CommentsMayford Knife Syndrome  2 Term 02/20/12 [redacted]w[redacted]d 16:37 / 01:09 7 lb 5.6 oz (3.335 kg) M Vag-Spont EPI  LIV  1 Term 09/28/09 [redacted]w[redacted]d  7 lb (3.175 kg) F Vag-Spont EPI N LIV      Past  Medical History: Past Medical History:  Diagnosis Date  . Chlamydia   . Depression   . Eczema   . Gonorrhea   . Pyelonephritis    X 2    Past Surgical History: Past Surgical History:  Procedure Laterality Date  . NO PAST SURGERIES      Family History:  Family History  Problem Relation Age of Onset  . Asthma Daughter   . Williams syndrome Son   . Diabetes Mother   . Birth defects Neg Hx   . Stroke Neg Hx   . Hypertension Neg Hx   . Anesthesia problems Neg Hx   . Hypotension Neg Hx   . Malignant hyperthermia Neg Hx   . Pseudochol deficiency Neg Hx     She denies any female cancers, bleeding or blood clotting disorders.  She reports history of Williams syndrome in son, denies mental retardation, birth defects or genetic disorders in her or the FOB's history.  Social History:  Social History   Social History  . Marital status: Single    Spouse name: N/A  . Number of children: N/A  . Years of education: N/A   Occupational History  . Not on file.   Social History Main Topics  . Smoking status: Current Every Day Smoker  Packs/day: 0.50    Types: Cigarettes  . Smokeless tobacco: Never Used  . Alcohol use Yes     Comment: occas. before knowing of pregnancy  . Drug use: No     Comment: last use one week ago  . Sexual activity: No   Other Topics Concern  . Not on file   Social History Narrative  . No narrative on file   Any pets in the household: no   Allergy: Allergies  Allergen Reactions  . Latex Itching and Swelling    Health Maintenance:  Mammogram Up to Date: not applicable Pap Smear Up to date: yes.  Last pap smear 01/04/2017. Abnormal: no History of STIs: Yes - GC, CT, Trich, >4 years ago.  Current Outpatient Medications: PNV  Physical Exam:   BP (!) 97/58   Pulse 87   Wt 139 lb 12.8 oz (63.4 kg)   LMP 10/16/2016 (Within Weeks)   BMI 24.76 kg/m  Body mass index is 24.76 kg/m. Fundal height: 20 cm FHTs: 143 bpm  Vitals:    02/26/17 1502  BP: (!) 97/58  Pulse: 87  Weight: 139 lb 12.8 oz (63.4 kg)   Fetal Heart Rate (bpm): 143  Uterus:  Fundal Height: 20 cm  Pelvic Exam:   PATIENT REFUSES  System: General: well-developed, well-nourished female in no acute distress   Breast:  normal appearance, no masses or tenderness   Skin: normal coloration and turgor, no rashes   Neurologic/Psych: Alert, oriented, normal mood and affect, no gross deficits   Extremities: normal strength, tone, and muscle mass, ROM of all joints is normal   HEENT PERRLA, extraocular movement intact and sclera clear, anicteric   Mouth/Teeth mucous membranes moist, pharynx normal without lesions and dental hygiene good   Neck Supple, normal appearance, and no thyromegaly    Cardiovascular: S1, S2 normal, no murmur, rub or gallop, regular rate and rhythm   Respiratory:  Clear to auscultation bilateral. Normal respiratory effort   Abdomen: soft, non-tender; bowel sounds normal; no masses,  no organomegaly     Assessment/Plan: [id number] [redacted]w[redacted]d  1. Supervision of high risk pregnancy in second trimester - COMPLETE placenta previa, ?vasa previa - [place] MFM OB COMP + 14 WK; Future  2. Complete placenta previa nos or without hemorrhage, second trimester - Anatomy scan scheduled - Pelvic rest emphasized  3. Cigarette smoker - 10 cig/day, encouraged cessation. Wants to quit and wants assistance, but does not want to see BHT for this.  4. Family history of genetic disorder - Williams syndrome with son, would like to have genetic counseling, ordered. QUAD today  5. UTI in mother during second trimester of pregnancy - Nitrites + in urine today, +frequency - Macrobid sent to pharmacy   Initial labs drawn. Continue prenatal vitamins. Genetic Screening discussed QUAD today, Genetic counseling due to history of child with Williams syndrome.: requested. Ultrasound discussed; fetal anatomic survey: ordered. Problem list reviewed and updated. The  nature of [facility] with multiple MDs and other Advanced Practice Providers was explained to patient; also emphasized that residents, students are part of our team. Routine obstetric precautions reviewed. Return in about 4 weeks (around 03/26/2017) for Routine OB visit.  >50% of 30 min visit spent on counseling and coordination of care.     Cleda Clarks, DO OB Fellow [facility])

## 2017-02-26 NOTE — Progress Notes (Signed)
Breastfeeding tip reviewed Schedule anatomy u/s asap Initial prenatal labs today Declines flu vaccine

## 2017-02-27 LAB — HEMOGLOBIN A1C
Est. average glucose Bld gHb Est-mCnc: 103 mg/dL
HEMOGLOBIN A1C: 5.2 % (ref 4.8–5.6)

## 2017-02-27 LAB — CERVICOVAGINAL ANCILLARY ONLY
Chlamydia: NEGATIVE
Neisseria Gonorrhea: NEGATIVE

## 2017-02-27 LAB — OBSTETRIC PANEL, INCLUDING HIV
ANTIBODY SCREEN: NEGATIVE
BASOS ABS: 0 10*3/uL (ref 0.0–0.2)
Basos: 0 %
EOS (ABSOLUTE): 0.4 10*3/uL (ref 0.0–0.4)
Eos: 4 %
HEMATOCRIT: 32.5 % — AB (ref 34.0–46.6)
HEP B S AG: NEGATIVE
HIV SCREEN 4TH GENERATION: NONREACTIVE
Hemoglobin: 11.1 g/dL (ref 11.1–15.9)
Immature Grans (Abs): 0 10*3/uL (ref 0.0–0.1)
Immature Granulocytes: 0 %
LYMPHS ABS: 3.1 10*3/uL (ref 0.7–3.1)
Lymphs: 31 %
MCH: 28.5 pg (ref 26.6–33.0)
MCHC: 34.2 g/dL (ref 31.5–35.7)
MCV: 84 fL (ref 79–97)
MONOCYTES: 5 %
Monocytes Absolute: 0.5 10*3/uL (ref 0.1–0.9)
NEUTROS ABS: 6 10*3/uL (ref 1.4–7.0)
Neutrophils: 60 %
PLATELETS: 255 10*3/uL (ref 150–379)
RBC: 3.89 x10E6/uL (ref 3.77–5.28)
RDW: 14.2 % (ref 12.3–15.4)
RPR: NONREACTIVE
RUBELLA: 5.55 {index} (ref >0.99)
Rh Factor: POSITIVE
WBC: 10 10*3/uL (ref 3.4–10.8)

## 2017-02-27 LAB — GLUCOSE TOLERANCE, 1 HOUR: Glucose, 1Hr PP: 63 mg/dL — ABNORMAL LOW (ref 65–199)

## 2017-02-28 ENCOUNTER — Encounter: Payer: Self-pay | Admitting: Family Medicine

## 2017-03-01 LAB — URINE CULTURE, OB REFLEX

## 2017-03-01 LAB — CULTURE, OB URINE

## 2017-03-05 ENCOUNTER — Ambulatory Visit (HOSPITAL_COMMUNITY): Admission: RE | Admit: 2017-03-05 | Payer: Medicaid Other | Source: Ambulatory Visit

## 2017-03-05 ENCOUNTER — Ambulatory Visit (HOSPITAL_COMMUNITY)
Admission: RE | Admit: 2017-03-05 | Discharge: 2017-03-05 | Disposition: A | Discharge status: Home / Self Care | Payer: Medicaid Other | Source: Ambulatory Visit | Attending: Family Medicine | Admitting: Family Medicine

## 2017-03-05 ENCOUNTER — Encounter (HOSPITAL_COMMUNITY): Payer: Self-pay

## 2017-03-05 VITALS — BP 98/55 | HR 78 | Wt 142.2 lb

## 2017-03-05 DIAGNOSIS — Z3A22 22 weeks gestation of pregnancy: Secondary | ICD-10-CM | POA: Diagnosis not present

## 2017-03-05 DIAGNOSIS — O0992 Supervision of high risk pregnancy, unspecified, second trimester: Secondary | ICD-10-CM | POA: Diagnosis present

## 2017-03-05 DIAGNOSIS — O4400 Placenta previa specified as without hemorrhage, unspecified trimester: Secondary | ICD-10-CM | POA: Insufficient documentation

## 2017-03-05 DIAGNOSIS — O4402 Placenta previa specified as without hemorrhage, second trimester: Secondary | ICD-10-CM

## 2017-03-06 ENCOUNTER — Ambulatory Visit (HOSPITAL_COMMUNITY): Payer: Medicaid Other

## 2017-03-06 ENCOUNTER — Ambulatory Visit (HOSPITAL_COMMUNITY): Payer: Medicaid Other | Attending: Family Medicine

## 2017-03-22 ENCOUNTER — Ambulatory Visit: Payer: Medicaid Other

## 2017-03-26 ENCOUNTER — Encounter: Payer: Medicaid Other | Admitting: Family Medicine

## 2017-03-29 ENCOUNTER — Encounter: Payer: Self-pay | Admitting: *Deleted

## 2017-03-29 ENCOUNTER — Ambulatory Visit (INDEPENDENT_AMBULATORY_CARE_PROVIDER_SITE_OTHER): Payer: Medicaid Other | Admitting: Family Medicine

## 2017-03-29 VITALS — BP 93/55 | HR 101 | Wt 147.6 lb

## 2017-03-29 DIAGNOSIS — O0992 Supervision of high risk pregnancy, unspecified, second trimester: Secondary | ICD-10-CM | POA: Diagnosis not present

## 2017-03-29 DIAGNOSIS — O4402 Placenta previa specified as without hemorrhage, second trimester: Secondary | ICD-10-CM | POA: Diagnosis not present

## 2017-03-29 MED ORDER — PRENATAL VITAMINS 28-0.8 MG PO TABS
1.0000 | ORAL_TABLET | Freq: Every day | ORAL | 12 refills | Status: DC
Start: 2017-03-29 — End: 2017-05-20

## 2017-03-29 NOTE — Progress Notes (Signed)
Addendum: 4:25 pm- completed medical home screening form.

## 2017-03-29 NOTE — Progress Notes (Signed)
States had a little bleeding last night when used restroom- states it was dark red. None since then.

## 2017-03-29 NOTE — Progress Notes (Signed)
   PRENATAL VISIT NOTE  Subjective:  Jack QuartoJamel L Mahler is a 26 y.o. [id number] at 5249w6d being seen today for ongoing prenatal care.  She is currently monitored for the following issues for this high-risk pregnancy and has Pica; Cigarette smoker; Marijuana use; Depression; Supervision of high risk pregnancy in second trimester; Family history of genetic disorder; and Complete placenta previa nos or without hemorrhage, second trimester on her problem list.  Patient reports no complaints.  Contractions: Not present. Vag. Bleeding: Other.  Movement: Present. Denies leaking of fluid.   The following portions of the patient's history were reviewed and updated as appropriate: allergies, current medications, past family history, past medical history, past social history, past surgical history and problem list. Problem list updated.  Objective:   Vitals:   03/29/17 1307  BP: (!) 93/55  Pulse: (!) 101  Weight: 147 lb 9.6 oz (67 kg)    Fetal Status: Fetal Heart Rate (bpm): 147   Movement: Present     General:  Alert, oriented and cooperative. Patient is in no acute distress.  Skin: Skin is warm and dry. No rash noted.   Cardiovascular: Normal heart rate noted  Respiratory: Normal respiratory effort, no problems with respiration noted  Abdomen: Soft, gravid, appropriate for gestational age. Pain/Pressure: Absent     Pelvic:  Cervical exam deferred        Extremities: Normal range of motion.  Edema: Trace  Mental Status: Normal mood and affect. Normal behavior. Normal judgment and thought content.   Assessment and Plan:  Pregnancy: G5P4004 at 5949w6d  1. Supervision of high risk pregnancy in second trimester FHT and FH normal - Prenatal Vit-Fe Fumarate-FA (PRENATAL VITAMINS) 28-0.8 MG TABS; Take 1 tablet by mouth daily.  Dispense: 30 tablet; Refill: 12  2. Complete placenta previa nos or without hemorrhage, second trimester Reviewed symptoms of when to go to the hospital. - Prenatal Vit-Fe  Fumarate-FA (PRENATAL VITAMINS) 28-0.8 MG TABS; Take 1 tablet by mouth daily.  Dispense: 30 tablet; Refill: 12  Preterm labor symptoms and general obstetric precautions including but not limited to vaginal bleeding, contractions, leaking of fluid and fetal movement were reviewed in detail with the patient. Please refer to After Visit Summary for other counseling recommendations.  Return for obfu/ fasting 2hr gtt/ 28 wk labs.   Levie HeritageJacob J Luisangel Wainright, DO

## 2017-04-03 ENCOUNTER — Other Ambulatory Visit (HOSPITAL_COMMUNITY): Payer: Self-pay | Admitting: Maternal and Fetal Medicine

## 2017-04-03 ENCOUNTER — Ambulatory Visit (HOSPITAL_COMMUNITY)
Admission: RE | Admit: 2017-04-03 | Discharge: 2017-04-03 | Disposition: A | Discharge status: Home / Self Care | Payer: Medicaid Other | Source: Ambulatory Visit | Attending: Family Medicine | Admitting: Family Medicine

## 2017-04-03 ENCOUNTER — Encounter (HOSPITAL_COMMUNITY): Payer: Self-pay

## 2017-04-03 DIAGNOSIS — O09892 Supervision of other high risk pregnancies, second trimester: Secondary | ICD-10-CM | POA: Diagnosis not present

## 2017-04-03 DIAGNOSIS — O99332 Smoking (tobacco) complicating pregnancy, second trimester: Secondary | ICD-10-CM | POA: Diagnosis not present

## 2017-04-03 DIAGNOSIS — IMO0002 Reserved for concepts with insufficient information to code with codable children: Secondary | ICD-10-CM

## 2017-04-03 DIAGNOSIS — Z0489 Encounter for examination and observation for other specified reasons: Secondary | ICD-10-CM

## 2017-04-03 DIAGNOSIS — O0932 Supervision of pregnancy with insufficient antenatal care, second trimester: Secondary | ICD-10-CM

## 2017-04-03 DIAGNOSIS — Z3A26 26 weeks gestation of pregnancy: Secondary | ICD-10-CM | POA: Diagnosis not present

## 2017-04-03 DIAGNOSIS — F1910 Other psychoactive substance abuse, uncomplicated: Secondary | ICD-10-CM

## 2017-04-03 DIAGNOSIS — O4402 Placenta previa specified as without hemorrhage, second trimester: Secondary | ICD-10-CM

## 2017-04-03 DIAGNOSIS — O99320 Drug use complicating pregnancy, unspecified trimester: Secondary | ICD-10-CM

## 2017-04-03 NOTE — Addendum Note (Signed)
Encounter addended by: Genevie CheshireWaken, Tramaine Snell M, RT on: 04/03/2017  4:31 PM<BR>    Actions taken: Imaging Exam ended

## 2017-04-04 ENCOUNTER — Ambulatory Visit (INDEPENDENT_AMBULATORY_CARE_PROVIDER_SITE_OTHER): Payer: Medicaid Other | Admitting: Physician Assistant

## 2017-04-04 ENCOUNTER — Telehealth: Payer: Self-pay | Admitting: *Deleted

## 2017-04-04 ENCOUNTER — Other Ambulatory Visit (HOSPITAL_COMMUNITY): Payer: Self-pay | Admitting: *Deleted

## 2017-04-04 DIAGNOSIS — O43103 Malformation of placenta, unspecified, third trimester: Secondary | ICD-10-CM

## 2017-04-04 NOTE — Telephone Encounter (Addendum)
-----   Message from Jonathon BellowsBeronica Mendez sent at 04/04/2017 10:37 AM EDT ----- Please call patient has question regarding her last visit.   Thanks!  5/30  1645  Called pt and discussed her concerns.  She stated that she thinks she has a UTI and wants to know the results of her pee test from last visit (5/24).  I advised pt that I did not see where any testing of her urine had been done at that visit. She was upset that she had been asked for a sample by the nurse and then it was not tested. I apologized to pt and stated that I could not provide any information as to why that happened. Pt was advised that she may come in tomorrow for urine sample testing. She agreed to appt @ 1515.

## 2017-04-05 ENCOUNTER — Ambulatory Visit: Payer: Medicaid Other

## 2017-04-10 ENCOUNTER — Encounter (HOSPITAL_COMMUNITY): Payer: Self-pay

## 2017-04-10 ENCOUNTER — Inpatient Hospital Stay (HOSPITAL_COMMUNITY)
Admission: AD | Admit: 2017-04-10 | Discharge: 2017-04-17 | Disposition: A | Discharge status: Home / Self Care | Payer: Medicaid Other | Source: Ambulatory Visit | Attending: Obstetrics & Gynecology | Admitting: Obstetrics & Gynecology

## 2017-04-10 ENCOUNTER — Inpatient Hospital Stay (HOSPITAL_COMMUNITY)
Admission: AD | Admit: 2017-04-10 | Discharge: 2017-04-10 | DRG: 782 | Disposition: A | Discharge status: Home / Self Care | Payer: Medicaid Other | Source: Ambulatory Visit | Attending: Family Medicine | Admitting: Family Medicine

## 2017-04-10 DIAGNOSIS — Z91040 Latex allergy status: Secondary | ICD-10-CM | POA: Diagnosis not present

## 2017-04-10 DIAGNOSIS — O4692 Antepartum hemorrhage, unspecified, second trimester: Secondary | ICD-10-CM | POA: Diagnosis not present

## 2017-04-10 DIAGNOSIS — O99332 Smoking (tobacco) complicating pregnancy, second trimester: Secondary | ICD-10-CM | POA: Diagnosis present

## 2017-04-10 DIAGNOSIS — O4403 Placenta previa specified as without hemorrhage, third trimester: Secondary | ICD-10-CM

## 2017-04-10 DIAGNOSIS — Z3A27 27 weeks gestation of pregnancy: Secondary | ICD-10-CM | POA: Diagnosis not present

## 2017-04-10 DIAGNOSIS — O4402 Placenta previa specified as without hemorrhage, second trimester: Secondary | ICD-10-CM | POA: Diagnosis present

## 2017-04-10 DIAGNOSIS — N939 Abnormal uterine and vaginal bleeding, unspecified: Secondary | ICD-10-CM | POA: Diagnosis present

## 2017-04-10 DIAGNOSIS — Z3A28 28 weeks gestation of pregnancy: Secondary | ICD-10-CM

## 2017-04-10 DIAGNOSIS — F17210 Nicotine dependence, cigarettes, uncomplicated: Secondary | ICD-10-CM | POA: Diagnosis present

## 2017-04-10 DIAGNOSIS — O4400 Placenta previa specified as without hemorrhage, unspecified trimester: Secondary | ICD-10-CM | POA: Diagnosis present

## 2017-04-10 DIAGNOSIS — O4412 Placenta previa with hemorrhage, second trimester: Principal | ICD-10-CM | POA: Diagnosis present

## 2017-04-10 LAB — URINALYSIS, ROUTINE W REFLEX MICROSCOPIC
Bacteria, UA: NONE SEEN
Bilirubin Urine: NEGATIVE
GLUCOSE, UA: NEGATIVE mg/dL
Ketones, ur: NEGATIVE mg/dL
NITRITE: NEGATIVE
PH: 6 (ref 5.0–8.0)
PROTEIN: NEGATIVE mg/dL
SPECIFIC GRAVITY, URINE: 1.017 (ref 1.005–1.030)

## 2017-04-10 LAB — WET PREP, GENITAL
CLUE CELLS WET PREP: NONE SEEN
SPERM: NONE SEEN
Trich, Wet Prep: NONE SEEN
WBC WET PREP: NONE SEEN
Yeast Wet Prep HPF POC: NONE SEEN

## 2017-04-10 LAB — CBC
HEMATOCRIT: 31.3 % — AB (ref 36.0–46.0)
HEMOGLOBIN: 10.4 g/dL — AB (ref 12.0–15.0)
MCH: 29 pg (ref 26.0–34.0)
MCHC: 33.2 g/dL (ref 30.0–36.0)
MCV: 87.2 fL (ref 78.0–100.0)
Platelets: 250 10*3/uL (ref 150–400)
RBC: 3.59 MIL/uL — AB (ref 3.87–5.11)
RDW: 13.7 % (ref 11.5–15.5)
WBC: 11.9 10*3/uL — AB (ref 4.0–10.5)

## 2017-04-10 MED ORDER — CALCIUM CARBONATE ANTACID 500 MG PO CHEW: 2.0000 | CHEWABLE_TABLET | ORAL | Status: DC | PRN

## 2017-04-10 MED ORDER — SODIUM CHLORIDE 0.9 % IV SOLN: 250.0000 mL | INTRAVENOUS | Status: DC | PRN

## 2017-04-10 MED ORDER — ACETAMINOPHEN 325 MG PO TABS: 650.0000 mg | ORAL_TABLET | ORAL | Status: DC | PRN

## 2017-04-10 MED ORDER — DOCUSATE SODIUM 100 MG PO CAPS
100.0000 mg | ORAL_CAPSULE | Freq: Every day | ORAL | Status: DC
  Administered 2017-04-11 – 2017-04-16 (×2): 100 mg via ORAL
  Filled 2017-04-10 (×4): qty 1

## 2017-04-10 MED ORDER — DOCUSATE SODIUM 100 MG PO CAPS: 100.0000 mg | ORAL_CAPSULE | Freq: Every day | ORAL | Status: DC

## 2017-04-10 MED ORDER — PRENATAL MULTIVITAMIN CH: 1.0000 | ORAL_TABLET | Freq: Every day | ORAL | Status: DC

## 2017-04-10 MED ORDER — ZOLPIDEM TARTRATE 5 MG PO TABS
5.0000 mg | ORAL_TABLET | Freq: Every evening | ORAL | Status: DC | PRN
  Filled 2017-04-10: qty 1

## 2017-04-10 MED ORDER — ZOLPIDEM TARTRATE 5 MG PO TABS: 5.0000 mg | ORAL_TABLET | Freq: Every evening | ORAL | Status: DC | PRN

## 2017-04-10 MED ORDER — SODIUM CHLORIDE 0.9% FLUSH: 3.0000 mL | Freq: Two times a day (BID) | INTRAVENOUS | Status: DC

## 2017-04-10 MED ORDER — BETAMETHASONE SOD PHOS & ACET 6 (3-3) MG/ML IJ SUSP
12.0000 mg | INTRAMUSCULAR | Status: DC
  Filled 2017-04-10: qty 2

## 2017-04-10 MED ORDER — PRENATAL MULTIVITAMIN CH
1.0000 | ORAL_TABLET | Freq: Every day | ORAL | Status: DC
  Administered 2017-04-13 – 2017-04-16 (×4): 1 via ORAL
  Filled 2017-04-10 (×5): qty 1

## 2017-04-10 MED ORDER — SODIUM CHLORIDE 0.9% FLUSH: 3.0000 mL | INTRAVENOUS | Status: DC | PRN

## 2017-04-10 NOTE — H&P (Signed)
CSN: [id number]  Arrival date and time: 04/10/17 1517   None     Chief Complaint  Patient presents with  . Vaginal Bleeding   HPI   Theresa [name] is a 26 yo 957w4d, G5P4004 who presents to the maternity admissions unit with complaints abnormal vaginal bleeding, contractions, and intermittent vaginal discharge.   She is currently being seen by high-risk unit due to placenta previa.  She reports that she has had 3 "episodes" of bleeding that began 4 days ago.  The first episode soaked the pad that she was wearing and was associated with contractions that lasted the entire day.  She described the bleeding as a "gush," but the bleeding stopped and she did not have another episode till two days later.  The next two episodes of bleeding were not associated with any contractions or pain, and both times she reported multiple small blood clots on her pads.  Again, the bleeding soaked up about 1 pad and stopped.  The vaginal discharge began about 3 months ago and is described as yellowish-white.  She has had 4 full-term, healthy vaginal births with no complications.   She admits to fetal movement, denies any other abdominal pain, LOF, fever, recent trauma, recent intercourse, lightheadedness,  dizziness, dysuria, or drug use.     OB History    Gravida Para Term Preterm AB Living   5 4 4     4    SAB TAB Ectopic Multiple Live Births         0 4      Past Medical History:  Diagnosis Date  . Chlamydia   . Depression   . Eczema   . Gonorrhea   . Pyelonephritis    X 2    Past Surgical History:  Procedure Laterality Date  . NO PAST SURGERIES      Family History  Problem Relation Age of Onset  . Asthma Daughter   . Williams syndrome Son   . Diabetes Mother   . Birth defects Neg Hx   . Stroke Neg Hx   . Hypertension Neg Hx   . Anesthesia problems Neg Hx   . Hypotension Neg Hx   . Malignant hyperthermia Neg Hx   . Pseudochol deficiency Neg Hx     Social History  Substance Use Topics  .  Smoking status: Current Every Day Smoker    Packs/day: 0.50    Types: Cigarettes  . Smokeless tobacco: Never Used  . Alcohol use Yes     Comment: occas. before knowing of pregnancy    Allergies:  Allergies  Allergen Reactions  . Latex Itching and Swelling    Prescriptions Prior to Admission  Medication Sig Dispense Refill Last Dose  . Prenatal Vit-Fe Fumarate-FA (PRENATAL VITAMINS) 28-0.8 MG TABS Take 1 tablet by mouth daily. (Patient not taking: Reported on 04/03/2017) 30 tablet 12 Not Taking    Review of Systems  Constitutional: Negative for chills and fever.  Gastrointestinal: Negative for nausea and vomiting.  Genitourinary: Positive for vaginal bleeding and vaginal discharge. Negative for dysuria and vaginal pain.  Neurological: Negative for dizziness and light-headedness.   Physical Exam   Blood pressure (!) 106/53, pulse 90, temperature 98.5 F (36.9 C), temperature source Oral, resp. rate 18, last menstrual period 10/16/2016, SpO2 100 %, unknown if currently breastfeeding.  Physical Exam  Constitutional: She appears well-developed and well-nourished.  Cardiovascular: Normal rate, regular rhythm and normal heart sounds.   Respiratory: Effort normal and breath sounds normal.  GI: Soft.  There is tenderness.  Neurological: She is alert.  Skin: Skin is warm and dry.  Psychiatric: She has a normal mood and affect. Her behavior is normal.    MAU Course  Procedures  MDM Discussed case with Dr. Alysia Penna, who recommends 7 day admission. Discussed case with Dr. Mikle Bosworth in NICU, who agrees that it is appropriate for patient to be admitted.   Assessment and Plan   1. Placenta previa antepartum in third trimester    2. Admit to Antepartum 3. Beta x 2 NST q shift Toco PRN No neuromag for now IV hep lock Admission labs Regular diet  [name] CNM

## 2017-04-10 NOTE — H&P (Signed)
Chief Complaint  Patient presents with  . Vaginal Bleeding   HPI   Theresa [name] is a 26 yo 566w4d, G5P4004 who presents to the maternity admissions unit with complaints abnormal vaginal bleeding, contractions, and intermittent vaginal discharge.   She is currently being seen by high-risk unit due to placenta previa.  She reports that she has had 3 "episodes" of bleeding that began 4 days ago.  The first episode soaked the pad that she was wearing and was associated with contractions that lasted the entire day.  She described the bleeding as a "gush," but the bleeding stopped and she did not have another episode till two days later.  The next two episodes of bleeding were not associated with any contractions or pain, and both times she reported multiple small blood clots on her pads.  Again, the bleeding soaked up about 1 pad and stopped.  The vaginal discharge began about 3 months ago and is described as yellowish-white.  She has had 4 full-term, healthy vaginal births with no complications.   She admits to fetal movement, denies any other abdominal pain, LOF, fever, recent trauma, recent intercourse, lightheadedness,  dizziness, dysuria, or drug use.             OB History    Gravida Para Term Preterm AB Living   5 4 4     4    SAB TAB Ectopic Multiple Live Births         0 4      Past Medical History:  Diagnosis Date  . Chlamydia   . Depression   . Eczema   . Gonorrhea   . Pyelonephritis    X 2         Past Surgical History:  Procedure Laterality Date  . NO PAST SURGERIES      Family History  Problem Relation Age of Onset  . Asthma Daughter   . Williams syndrome Son   . Diabetes Mother   . Birth defects Neg Hx   . Stroke Neg Hx   . Hypertension Neg Hx   . Anesthesia problems Neg Hx   . Hypotension Neg Hx   . Malignant hyperthermia Neg Hx   . Pseudochol deficiency Neg Hx     Social History  Substance Use Topics  . Smoking status:  Current Every Day Smoker    Packs/day: 0.50    Types: Cigarettes  . Smokeless tobacco: Never Used  . Alcohol use Yes      Comment: occas. before knowing of pregnancy    Allergies:      Allergies  Allergen Reactions  . Latex Itching and Swelling           Prescriptions Prior to Admission  Medication Sig Dispense Refill Last Dose  . Prenatal Vit-Fe Fumarate-FA (PRENATAL VITAMINS) 28-0.8 MG TABS Take 1 tablet by mouth daily. (Patient not taking: Reported on 04/03/2017) 30 tablet 12 Not Taking    Review of Systems  Constitutional: Negative for chills and fever.  Gastrointestinal: Negative for nausea and vomiting.  Genitourinary: Positive for vaginal bleeding and vaginal discharge. Negative for dysuria and vaginal pain.  Neurological: Negative for dizziness and light-headedness.   Physical Exam   Blood pressure (!) 106/53, pulse 90, temperature 98.5 F (36.9 C), temperature source Oral, resp. rate 18, last menstrual period 10/16/2016, SpO2 100 %, unknown if currently breastfeeding.  Physical Exam  Constitutional: She appears well-developed and well-nourished.  Cardiovascular: Normal rate, regular rhythm and normal heart sounds.   Respiratory:  Effort normal and breath sounds normal.  GI: Soft. There is tenderness.  Neurological: She is alert.  Skin: Skin is warm and dry.  Psychiatric: She has a normal mood and affect. Her behavior is normal.    MAU Course  Procedures  MDM Discussed case with Dr. Alysia Penna, who recommends 7 day admission. Discussed case with Dr. Mikle Bosworth in NICU, who agrees that it is appropriate for patient to be admitted.   Assessment and Plan   1. Placenta previa antepartum in third trimester    2. Admit to Antepartum 3. Beta x 2 NST q shift Toco PRN No neuromag for now IV hep lock Admission labs Regular diet  [name] CNM  Attestation of Attending Supervision of Advanced Practitioner (PA/CNM/NP): Evaluation and  management procedures were performed by the Advanced Practitioner under my supervision and collaboration.  I have reviewed the Advanced Practitioner's note and chart, and I agree with the management and plan.  Candelaria Celeste, DO Attending Physician Faculty Practice, [facility] [place]

## 2017-04-10 NOTE — MAU Note (Signed)
Patient said she wanted to bring her children out to her mother's car and she said she would be right back in. Patient did not come back. Patient was called by provider and said she would be back in an hour after she situates her children.

## 2017-04-10 NOTE — MAU Provider Note (Signed)
History     CSN: [id number]  Arrival date and time: 04/10/17 1517   None     Chief Complaint  Patient presents with  . Vaginal Bleeding   HPI   Theresa [name] is a 26 yo 7871w4d, G5P4004 who presents to the maternity admissions unit with complaints abnormal vaginal bleeding, contractions, and intermittent vaginal discharge.   She is currently being seen by high-risk unit due to placenta previa.  She reports that she has had 3 "episodes" of bleeding that began 4 days ago.  The first episode soaked the pad that she was wearing and was associated with contractions that lasted the entire day.  She described the bleeding as a "gush," but the bleeding stopped and she did not have another episode till two days later.  The next two episodes of bleeding were not associated with any contractions or pain, and both times she reported multiple small blood clots on her pads.  Again, the bleeding soaked up about 1 pad and stopped.  The vaginal discharge began about 3 months ago and is described as yellowish-white.  She has had 4 full-term, healthy vaginal births with no complications.   She admits to fetal movement, denies any other abdominal pain, LOF, fever, recent trauma, recent intercourse, lightheadedness,  dizziness, dysuria, or drug use.     OB History    Gravida Para Term Preterm AB Living   5 4 4     4    SAB TAB Ectopic Multiple Live Births         0 4      Past Medical History:  Diagnosis Date  . Chlamydia   . Depression   . Eczema   . Gonorrhea   . Pyelonephritis    X 2    Past Surgical History:  Procedure Laterality Date  . NO PAST SURGERIES      Family History  Problem Relation Age of Onset  . Asthma Daughter   . Williams syndrome Son   . Diabetes Mother   . Birth defects Neg Hx   . Stroke Neg Hx   . Hypertension Neg Hx   . Anesthesia problems Neg Hx   . Hypotension Neg Hx   . Malignant hyperthermia Neg Hx   . Pseudochol deficiency Neg Hx     Social History  Substance  Use Topics  . Smoking status: Current Every Day Smoker    Packs/day: 0.50    Types: Cigarettes  . Smokeless tobacco: Never Used  . Alcohol use Yes     Comment: occas. before knowing of pregnancy    Allergies:  Allergies  Allergen Reactions  . Latex Itching and Swelling    Prescriptions Prior to Admission  Medication Sig Dispense Refill Last Dose  . Prenatal Vit-Fe Fumarate-FA (PRENATAL VITAMINS) 28-0.8 MG TABS Take 1 tablet by mouth daily. (Patient not taking: Reported on 04/03/2017) 30 tablet 12 Not Taking    Review of Systems  Constitutional: Negative for chills and fever.  Gastrointestinal: Negative for nausea and vomiting.  Genitourinary: Positive for vaginal bleeding and vaginal discharge. Negative for dysuria and vaginal pain.  Neurological: Negative for dizziness and light-headedness.   Physical Exam   Blood pressure (!) 106/53, pulse 90, temperature 98.5 F (36.9 C), temperature source Oral, resp. rate 18, last menstrual period 10/16/2016, SpO2 100 %, unknown if currently breastfeeding.  Physical Exam  Constitutional: She appears well-developed and well-nourished.  Cardiovascular: Normal rate, regular rhythm and normal heart sounds.   Respiratory: Effort normal and breath  sounds normal.  GI: Soft. There is tenderness.  Neurological: She is alert.  Skin: Skin is warm and dry.  Psychiatric: She has a normal mood and affect. Her behavior is normal.    MAU Course  Procedures  MDM   Assessment and Plan    Jeanann Lewandowsky 04/10/2017, 3:57 PM

## 2017-04-10 NOTE — MAU Note (Signed)
Patient states that she has been bleeding on and off for the past 4 days. Patient states that today at [phone] she started to bleed heavily and pass clots. She is not currently bleeding. Patient denies any pain. Fetus active.

## 2017-04-11 DIAGNOSIS — N939 Abnormal uterine and vaginal bleeding, unspecified: Secondary | ICD-10-CM | POA: Diagnosis present

## 2017-04-11 DIAGNOSIS — Z3A27 27 weeks gestation of pregnancy: Secondary | ICD-10-CM

## 2017-04-11 MED ORDER — BETAMETHASONE SOD PHOS & ACET 6 (3-3) MG/ML IJ SUSP
12.0000 mg | INTRAMUSCULAR | Status: AC
  Administered 2017-04-11 – 2017-04-12 (×2): 12 mg via INTRAMUSCULAR
  Filled 2017-04-11 (×2): qty 2

## 2017-04-11 NOTE — Progress Notes (Signed)
p0t refusing IV now after 2 attempts with no success, dr. Adrian BlackwaterStinson notified pt refusing IV, educated on importance of IV if pt starts to bleed, pt still refuses.

## 2017-04-11 NOTE — Progress Notes (Signed)
Pt reported bright red bleeding with wiping. Dr. Alvester MorinNewton notified of the bleeding and the fact that patient left the floor. Pt is currently in EFM. FHR 135 with moderate variability. Fetal movement visualized. Carmelina DaneERRI L Simone Tuckey, RN

## 2017-04-11 NOTE — Progress Notes (Signed)
Went to patient's room and patient was no where to be found. Patient eventually came back to floor. Educated patient on her bedrest order and the importance of not leaving the floor. Pt still refusing to have IV placed. Pt currently in bathroom and will be placed on fetal monitor. Theresa DaneERRI L Charlene Cowdrey, RN

## 2017-04-11 NOTE — Progress Notes (Signed)
Pt off floor. Pt has been educated on the risks of leaving the floor. Pt denies any vaginal bleeding at this time Theresa DaneERRI L Brae Gartman, RN

## 2017-04-11 NOTE — Progress Notes (Signed)
Patient ID: Theresa [name], female   DOB: [birth year], 26 y.o.   MRN: [id number]  FACULTY PRACTICE ANTEPARTUM NOTE  Theresa [name] is a 26 y.o. [id number] at 7413w5d  who is admitted for vaginal bleeding with known previa.   Fetal presentation is cephalic. Length of Stay:  1  Days  Subjective: Reports no further bleeding. Good fetal activity. Patient agreeable to BMZ and IV - I communicated this to the nurse. Patient reports good fetal movement.   She reports no uterine contractions She reports no bleeding  She reports no loss of fluid per vagina.  Vitals:  Blood pressure (!) 91/48, pulse 69, temperature 98 F (36.7 C), temperature source Oral, resp. rate 18, height 5' (1.524 m), weight 146 lb (66.2 kg), last menstrual period 10/16/2016, SpO2 100 %, unknown if currently breastfeeding. Physical Examination:  General appearance - alert, well appearing, and in no distress Chest - clear to auscultation, no wheezes, rales or rhonchi, symmetric air entry Heart - normal rate, regular rhythm, normal S1, S2, no murmurs, rubs, clicks or gallops Abdomen - soft, nontender, nondistended, no masses or organomegaly Fundal Height:  size equals dates Extremities: extremities normal, atraumatic, no cyanosis or edema and Homans sign is negative, no sign of DVT  Membranes:intact  Fetal Monitoring:  Baseline: 140 bpm, Variability: Good {> 6 bpm), Accelerations: Non-reactive but appropriate for gestational age and Decelerations: Absent  Labs:  Results for orders placed or performed during the hospital encounter of 04/10/17 (from the past 24 hour(s))  CBC   Collection Time: 04/10/17  5:25 PM  Result Value Ref Range   WBC 11.9 (H) 4.0 - 10.5 K/uL   RBC 3.59 (L) 3.87 - 5.11 MIL/uL   Hemoglobin 10.4 (L) 12.0 - 15.0 g/dL   HCT 14.731.3 (L) 82.936.0 - 56.246.0 %   MCV 87.2 78.0 - 100.0 fL   MCH 29.0 26.0 - 34.0 pg   MCHC 33.2 30.0 - 36.0 g/dL   RDW 13.013.7 86.511.5 - 78.415.5 %   Platelets 250 150 - 400 K/uL  Results for orders  placed or performed during the hospital encounter of 04/10/17 (from the past 24 hour(s))  Urinalysis, Routine w reflex microscopic   Collection Time: 04/10/17  4:55 PM  Result Value Ref Range   Color, Urine YELLOW YELLOW   APPearance HAZY (A) CLEAR   Specific Gravity, Urine 1.017 1.005 - 1.030   pH 6.0 5.0 - 8.0   Glucose, UA NEGATIVE NEGATIVE mg/dL   Hgb urine dipstick MODERATE (A) NEGATIVE   Bilirubin Urine NEGATIVE NEGATIVE   Ketones, ur NEGATIVE NEGATIVE mg/dL   Protein, ur NEGATIVE NEGATIVE mg/dL   Nitrite NEGATIVE NEGATIVE   Leukocytes, UA TRACE (A) NEGATIVE   RBC / HPF 0-5 0 - 5 RBC/hpf   WBC, UA 6-30 0 - 5 WBC/hpf   Bacteria, UA NONE SEEN NONE SEEN   Squamous Epithelial / LPF 6-30 (A) NONE SEEN   Mucous PRESENT   Wet prep, genital   Collection Time: 04/10/17  5:01 PM  Result Value Ref Range   Yeast Wet Prep HPF POC NONE SEEN NONE SEEN   Trich, Wet Prep NONE SEEN NONE SEEN   Clue Cells Wet Prep HPF POC NONE SEEN NONE SEEN   WBC, Wet Prep HPF POC NONE SEEN NONE SEEN   Sperm NONE SEEN     Imaging Studies:       Medications:  Scheduled . betamethasone acetate-betamethasone sodium phosphate  12 mg Intramuscular Q24H  . docusate sodium  100 mg Oral Daily  . prenatal multivitamin  1 tablet Oral Q1200   I have reviewed the patient's current medications.  ASSESSMENT: Active Problems:   Complete placenta previa nos or without hemorrhage, second trimester   Vaginal bleeding   [redacted] weeks gestation of pregnancy   PLAN: 1. Vaginal bleeding 2. Placenta Previa 3. [redacted] week Gestation  Will give BMZ today  No further bleeding  Reassuring NST (appropriate for gestational age) Continue routine antenatal care.   Levie Heritage, DO 04/11/2017,8:02 AM

## 2017-04-11 NOTE — Progress Notes (Signed)
Called by nursing - pt declined steroids. I talked with patient on phone, discussed purpose of steroids and risks to baby. Pt declined.  Levie HeritageStinson, Jacob J, DO 04/11/2017 1:02 AM

## 2017-04-11 NOTE — Progress Notes (Signed)
Pt refusing betamethasone shot wanting to talk with dr. Adrian BlackwaterStinson.   Dr. Adrian Blackwaterstinson on phone with pt pt refusing betamethasone shot. Medicine not given per pt refusal.

## 2017-04-12 DIAGNOSIS — Z3A27 27 weeks gestation of pregnancy: Secondary | ICD-10-CM

## 2017-04-12 DIAGNOSIS — O4402 Placenta previa specified as without hemorrhage, second trimester: Secondary | ICD-10-CM | POA: Diagnosis present

## 2017-04-12 LAB — GC/CHLAMYDIA PROBE AMP (~~LOC~~) NOT AT ARMC
CHLAMYDIA, DNA PROBE: NEGATIVE
NEISSERIA GONORRHEA: NEGATIVE

## 2017-04-12 NOTE — Progress Notes (Signed)
Patient ID: Theresa QuartoJamel L Canter, female   DOB: 1991-03-19, 26 y.o.   MRN: [id number]  FACULTY PRACTICE ANTEPARTUM NOTE  Theresa [name] is a 26 y.o. [id number] at Estimated Date of Delivery: 07/06/17   who is admitted for vaginal bleeding with known previa.   Fetal presentation is cephalic. Length of Stay:  2  Days  Subjective: Had some pink spots when she wiped last night Patient reports good fetal movement.   She reports no uterine contractions She reports no bleeding  She reports no loss of fluid per vagina.  Vitals:  Blood pressure (!) 105/52, pulse 70, temperature 98.2 F (36.8 C), temperature source Oral, resp. rate 18, height 5' (1.524 m), weight 146 lb (66.2 kg), last menstrual period 10/16/2016, SpO2 100 %, unknown if currently breastfeeding. Physical Examination:  General appearance - alert, well appearing, and in no distress Chest - clear to auscultation, no wheezes, rales or rhonchi, symmetric air entry Heart - normal rate, regular rhythm, normal S1, S2, no murmurs, rubs, clicks or gallops Abdomen - soft, nontender, nondistended, no masses or organomegaly Fundal Height:  size equals dates Extremities: extremities normal, atraumatic, no cyanosis or edema and Homans sign is negative, no sign of DVT  Membranes:intact  Fetal Monitoring:  Baseline: 140 bpm, Variability: Good {> 6 bpm), Accelerations: Non-reactive but appropriate for gestational age and Decelerations: Absent  Labs:  No results found for this or any previous visit (from the past 24 hour(s)).  Imaging Studies:       Medications:  Scheduled . docusate sodium  100 mg Oral Daily  . prenatal multivitamin  1 tablet Oral Q1200   I have reviewed the patient's current medications.  ASSESSMENT: Active Problems:   Complete placenta previa nos or without hemorrhage, second trimester   Vaginal bleeding   [redacted] weeks gestation of pregnancy   Placenta previa antepartum in second trimester   PLAN: 1. Vaginal bleeding 2.  Placenta Previa 3. [redacted] week Gestation  Will give BMZ today  No further bleeding  Reassuring NST (appropriate for gestational age) Continue routine antenatal care.   Lazaro ArmsEure, Kyle Luppino H, MD 04/12/2017,8:00 AMPatient ID: Theresa [name], female   DOB: 1991-03-19, 26 y.o.   MRN: [id number]

## 2017-04-13 DIAGNOSIS — N939 Abnormal uterine and vaginal bleeding, unspecified: Secondary | ICD-10-CM

## 2017-04-13 DIAGNOSIS — Z3A28 28 weeks gestation of pregnancy: Secondary | ICD-10-CM

## 2017-04-13 NOTE — Progress Notes (Signed)
MD notified of patient refusing type and screen.

## 2017-04-13 NOTE — Progress Notes (Signed)
Patient ID: Theresa [name], female   DOB: 1991/05/23, 26 y.o.   MRN: [id number] FACULTY PRACTICE ANTEPARTUM(COMPREHENSIVE) NOTE  Theresa [name] is a 26 y.o. [id number] at 2962w0d by best clinical estimate who is admitted for bleeding previa.   Fetal presentation is cephalic. Length of Stay:  3  Days  Subjective: Had some spotting last night, bright red bleeding noted, has resolved Patient reports the fetal movement as active. Patient reports uterine contraction  activity as none. Patient reports  vaginal bleeding as less flow than a normal period. Patient describes fluid per vagina as None.  Vitals:  Blood pressure (!) 87/43, pulse 73, temperature 98.1 F (36.7 C), temperature source Oral, resp. rate 18, height 5' (1.524 m), weight 146 lb (66.2 kg), last menstrual period 10/16/2016, SpO2 99 %, unknown if currently breastfeeding. Physical Examination:  General appearance - alert, well appearing, and in no distress Chest - normal effort Abdomen - gravid, NT Fundal Height:  size equals dates Extremities: Homans sign is negative, no sign of DVT  Membranes:intact  Fetal Monitoring: NST:  Baseline: 135 bpm, Variability: Good {> 6 bpm), Accelerations: Reactive and Decelerations: Absent   Baseline: 135 bpm, Variability: Good {> 6 bpm), Accelerations: Reactive and Decelerations: Absent  Baseline: 135 bpm, Variability: Good {> 6 bpm), Accelerations: Reactive and Decelerations: Absent Medications:  Scheduled . docusate sodium  100 mg Oral Daily  . prenatal multivitamin  1 tablet Oral Q1200   I have reviewed the patient's current medications.  ASSESSMENT: Active Problems:   Complete placenta previa nos or without hemorrhage, second trimester   Vaginal bleeding   [redacted] weeks gestation of pregnancy   Placenta previa antepartum in second trimester   PLAN: Still bleeding Continue hospitalization due to bleeding Refusing IV q 3 d T and S  Reva Boresanya S Elisah Parmer, MD 04/13/2017,7:42 AM

## 2017-04-14 NOTE — Progress Notes (Signed)
Patient ID: Theresa [name], female   DOB: [birth year], 26 y.o.   MRN: [id number] FACULTY PRACTICE ANTEPARTUM(COMPREHENSIVE) NOTE  Theresa [name] is a 26 y.o. [id number] at 6266w1d  who is admitted for bleeding placenta previa.   Length of Stay:  4  Days  Subjective: Pt had bright red bleeding yesteday.  Brown spotting today Patient reports the fetal movement as active. Patient reports uterine contraction  activity as none. Patient reports  vaginal bleeding as brwon spotting today. Patient describes fluid per vagina as None.  Vitals:  Blood pressure (!) 104/42, pulse 90, temperature 98.4 F (36.9 C), temperature source Oral, resp. rate 18, height 5' (1.524 m), weight 146 lb (66.2 kg), last menstrual period 10/16/2016, SpO2 100 %, unknown if currently breastfeeding. Physical Examination:  General appearance - alert, well appearing, and in no distress Abdomen - soft, nontender, nondistended, no masses or organomegaly Musculoskeletal - neg Homan's, no tenderness  Fetal Monitoring:  Baseline: 140 bpm, Variability: Good {> 6 bpm), Accelerations: Non-reactive but appropriate for gestational age and Decelerations: one deceleration   Medications:  Scheduled . docusate sodium  100 mg Oral Daily  . prenatal multivitamin  1 tablet Oral Q1200   I have reviewed the patient's current medications.  ASSESSMENT: Patient Active Problem List   Diagnosis Date Noted  . Placenta previa antepartum in second trimester 04/12/2017  . Vaginal bleeding 04/11/2017  . [redacted] weeks gestation of pregnancy 04/11/2017  . Placenta previa 04/10/2017  . Supervision of high risk pregnancy in second trimester 02/26/2017  . Family history of genetic disorder 02/26/2017  . Complete placenta previa nos or without hemorrhage, second trimester 02/26/2017  . Depression 05/16/2016  . Cigarette smoker 04/18/2016  . 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 Pica 03/15/2016    PLAN: 26 yo female with placenta previa Bleed yesterday BRB Will  place back on monitor today given deceleration Maintain type and cross  Elsie LincolnKelly Tonye Tancredi 04/14/2017,3:28 PM

## 2017-04-14 NOTE — Progress Notes (Signed)
Pt requesting to leave the unit because today is her daughter's birthday. Patient was advised that it would be AMA and educated on the risks of leaving. Dr. Penne LashLeggett aware of the patient's request to leave. Pt has not made a final decision at this time Carmelina DaneERRI L Len Kluver, RN

## 2017-04-15 NOTE — Progress Notes (Signed)
Patient ID: Theresa [name], female   DOB: 1990/12/12, 26 y.o.   MRN: [id number]  FACULTY PRACTICE ANTEPARTUM(COMPREHENSIVE) NOTE  Theresa [name] is a 26 y.o. [id number] at 4126w2d  who is admitted for bleeding and complete previa.  Day 2 after last bleed in hosptial.   Length of Stay:  5  Days  Subjective: No complaints Patient reports the fetal movement as active Patient reports uterine contraction  activity as none. Patient reports  vaginal bleeding as none. Patient describes fluid per vagina as None.  Vitals:  Blood pressure (!) 110/50, pulse 80, temperature 98.4 F (36.9 C), temperature source Oral, resp. rate 18, height 5' (1.524 m), weight 146 lb (66.2 kg), last menstrual period 10/16/2016, SpO2 95 %, unknown if currently breastfeeding. Physical Examination:  General appearance - alert, well appearing, and in no distress Abdomen - soft, nontender, nondistended, no masses or organomegaly Extremities - no evidence DVT, non tender  Fetal Monitoring: 16:40   Baseline: 140 bpm, Variability: Good {> 6 bpm), Accelerations: Non-reactive but appropriate for gestational age and Decelerations: Absent  22:10  Baseline: 140 bpm, Variability: Good {> 6 bpm), Accelerations: Non-reactive but appropriate for gestational age and Decelerations: Absent   Medications:  Scheduled . docusate sodium  100 mg Oral Daily  . prenatal multivitamin  1 tablet Oral Q1200   I have reviewed the patient's current medications.  ASSESSMENT: Patient Active Problem List   Diagnosis Date Noted  . Placenta previa antepartum in second trimester 04/12/2017  . Vaginal bleeding 04/11/2017  . [redacted] weeks gestation of pregnancy 04/11/2017  . Placenta previa 04/10/2017  . Supervision of high risk pregnancy in second trimester 02/26/2017  . Family history of genetic disorder 02/26/2017  . Complete placenta previa nos or without hemorrhage, second trimester 02/26/2017  . Depression 05/16/2016  . Cigarette smoker 04/18/2016  .  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 Pica 03/15/2016    PLAN: 26 yo female at 28 weeks 2 days with bleeding and complete placenta previa 1-Day 2 after last episode of BRB 2-Continue EFM daily. 3. Maintain IV access and active type and screen.  Elsie LincolnKelly Leggett 04/15/2017,10:01 AM

## 2017-04-16 ENCOUNTER — Inpatient Hospital Stay (HOSPITAL_COMMUNITY): Payer: Medicaid Other

## 2017-04-16 ENCOUNTER — Encounter (HOSPITAL_COMMUNITY): Payer: Self-pay

## 2017-04-16 DIAGNOSIS — O4402 Placenta previa specified as without hemorrhage, second trimester: Secondary | ICD-10-CM

## 2017-04-16 DIAGNOSIS — Z3A28 28 weeks gestation of pregnancy: Secondary | ICD-10-CM

## 2017-04-16 LAB — CBC
HEMATOCRIT: 31.2 % — AB (ref 36.0–46.0)
HEMOGLOBIN: 10.5 g/dL — AB (ref 12.0–15.0)
MCH: 29.1 pg (ref 26.0–34.0)
MCHC: 33.7 g/dL (ref 30.0–36.0)
MCV: 86.4 fL (ref 78.0–100.0)
PLATELETS: 215 10*3/uL (ref 150–400)
RBC: 3.61 MIL/uL — AB (ref 3.87–5.11)
RDW: 13.5 % (ref 11.5–15.5)
WBC: 13.6 10*3/uL — ABNORMAL HIGH (ref 4.0–10.5)

## 2017-04-16 LAB — TYPE AND SCREEN
ABO/RH(D): B POS
Antibody Screen: NEGATIVE

## 2017-04-16 MED ORDER — NICOTINE 14 MG/24HR TD PT24
14.0000 mg | MEDICATED_PATCH | Freq: Every day | TRANSDERMAL | Status: DC
  Administered 2017-04-17 (×2): 14 mg via TRANSDERMAL
  Filled 2017-04-16 (×2): qty 1

## 2017-04-16 NOTE — Progress Notes (Signed)
Patient leaving floor in wheelchair with family.

## 2017-04-16 NOTE — Progress Notes (Signed)
Patient leaving floor for ultrasound

## 2017-04-16 NOTE — Progress Notes (Addendum)
   Patient ID: Theresa [name], female   DOB: 1990/11/08, 26 y.o.   MRN: [id number] FACULTY PRACTICE ANTEPARTUM(COMPREHENSIVE) NOTE  Theresa [name] is a 26 y.o. [id number] at 2451w3d  who is admitted for vaginal bleeding with a posterior previa.   Fetal presentation is unsure. Last u/s 5/28 Length of Stay:  6  Days  Subjective: Now hospital day 6 after admit for bleeding for pl previa. Patient reports the fetal movement as active. Patient reports uterine contraction  activity as none. Patient reports  vaginal bleeding as none. Patient describes fluid per vagina as None.  Vitals:  Blood pressure (!) 116/52, pulse 71, temperature 98.9 F (37.2 C), temperature source Oral, resp. rate 18, height 5' (1.524 m), weight 66.2 kg (146 lb), last menstrual period 10/16/2016, SpO2 100 %, unknown if currently breastfeeding. Physical Examination:  General appearance - alert, well appearing, and in no distress, oriented to person, place, and time and normal appearing weight Heart - normal rate and regular rhythm Abdomen - soft, nontender, nondistended Fundal Height:  size equals dates Cervical Exam: Not evaluated. and f fetal presentation is .Marland Kitchen. Extremities: extremities normal, atraumatic, no cyanosis or edema and Homans sign is negative, no sign of DVT with DTRs 2+ bilaterally Membranes:intact  Fetal Monitoring:  unsure  NST reviewed: reactive with good beat to beat; no decels. Uterine activity none.  Labs:   Imaging Studies:    Last u/s 2 wk ago prior to bleed..  Medications:  Scheduled . docusate sodium  100 mg Oral Daily  . prenatal multivitamin  1 tablet Oral Q1200   I have reviewed the patient's current medications.  ASSESSMENT: Patient Active Problem List   Diagnosis Date Noted  . Placenta previa antepartum in second trimester 04/12/2017  . Vaginal bleeding 04/11/2017  . [redacted] weeks gestation of pregnancy 04/11/2017  . Placenta previa 04/10/2017  . Supervision of high risk pregnancy in  second trimester 02/26/2017  . Family history of genetic disorder 02/26/2017  . Complete placenta previa nos or without hemorrhage, second trimester 02/26/2017  . Depression 05/16/2016  . Cigarette smoker 04/18/2016  . 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 Pica 03/15/2016    PLAN: Pt is a candidate for d/c tomorrow. Will update u/s today   Nandi Tonnesen V 04/16/2017,9:41 AM

## 2017-04-17 DIAGNOSIS — O4403 Placenta previa specified as without hemorrhage, third trimester: Secondary | ICD-10-CM

## 2017-04-17 NOTE — Progress Notes (Signed)
Daily Antepartum Note  Admission Date: 04/10/2017 Current Date: 04/17/2017 6:59 AM  Theresa [name] is a 26 y.o. [id number] @ 3250w4d, HD#8, admitted for VB in the setting of complete previa.  Pregnancy complicated by: H/o pyelo in prior pregnancy Patient Active Problem List   Diagnosis Date Noted  . Placenta previa antepartum in second trimester 04/12/2017  . Vaginal bleeding 04/11/2017  . [redacted] weeks gestation of pregnancy 04/11/2017  . Placenta previa 04/10/2017  . Supervision of high risk pregnancy in second trimester 02/26/2017  . Family history of genetic disorder 02/26/2017  . Complete placenta previa nos or without hemorrhage, second trimester 02/26/2017  . Depression 05/16/2016  . Cigarette smoker 04/18/2016  . 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 Pica 03/15/2016    Overnight/24hr events:  none  Subjective:  Last bleed was on HD#3 per patient (spotting)  No s/s of PTL or decreased FM  Objective:    Current Vital Signs 24h Vital Sign Ranges  T 98.5 F (36.9 C) Temp  Avg: 98.9 F (37.2 C)  Min: 98.4 F (36.9 C)  Max: 99.3 F (37.4 C)  BP (!) 105/51 BP  Min: 96/80  Max: 116/52  HR 78 Pulse  Avg: 79.5  Min: 71  Max: 87  RR 16 Resp  Avg: 17.6  Min: 16  Max: 18  SaO2 100 % Not Delivered SpO2  Avg: 97.2 %  Min: 90 %  Max: 100 %       24 Hour I/O Current Shift I/O  Time Ins Outs No intake/output data recorded. No intake/output data recorded.  140 baseline +accels, no decels, mod variability x 1 h 145 baseline +accels, no decels, mod variability x 1 h   Physical exam: General: Well nourished, well developed female in no acute distress. Abdomen: gravid nttp Cardiovascular: S1, S2 normal, no murmur, rub or gallop, regular rate and rhythm Respiratory: CTAB Extremities: no clubbing, cyanosis or edema Skin: Warm and dry.   Medications: Current Facility-Administered Medications  Medication Dose Route Frequency Provider Last Rate Last Dose  . acetaminophen (TYLENOL) tablet 650 mg   650 mg Oral Q4H PRN Levie HeritageStinson, Jacob J, DO      . calcium carbonate (TUMS - dosed in mg elemental calcium) chewable tablet 400 mg of elemental calcium  2 tablet Oral Q4H PRN Levie HeritageStinson, Jacob J, DO      . docusate sodium (COLACE) capsule 100 mg  100 mg Oral Daily Levie HeritageStinson, Jacob J, DO   100 mg at 04/16/17 1009  . nicotine (NICODERM CQ - dosed in mg/24 hours) patch 14 mg  14 mg Transdermal Daily [place] [name], Theresa Shartzer, MD   14 mg at 04/17/17 0003  . prenatal multivitamin tablet 1 tablet  1 tablet Oral Q1200 Levie HeritageStinson, Jacob J, DO   1 tablet at 04/16/17 1009  . zolpidem (AMBIEN) tablet 5 mg  5 mg Oral QHS PRN Levie HeritageStinson, Jacob J, DO        Labs:   Recent Labs Lab 04/10/17 1725 04/16/17 1827  WBC 11.9* 13.6*  HGB 10.4* 10.5*  HCT 31.3* 31.2*  PLT 250 215    B POS  Radiology:  6/11: still complete posterior previa. Normal AFI, cephalic 5/29: 41%, 877g, normal AC  Assessment & Plan:  Pt doing well *Pregnancy:routine care. Will do qday NSTs. Desires BTL. Papers signed this admission.  *Tobacco abuse: continue patch *VB, previa: d/c to home tomorrow if no bleeding. Rh pos *Preterm: no issues. S/p BMZ on 6/6-6/7 *PPx: OOB, SCDs *FEN/GI: regular diet *Dispo: d/c  to home tomorrow if no   Cornelia Copa. MD Attending Center for [employer] [facility])

## 2017-04-17 NOTE — Discharge Summary (Signed)
Antenatal Physician Discharge Summary  Patient ID: JOZI MALACHI MRN: [id number] DOB/AGE: [birth year] 26 y.o.  Admit date: 04/10/2017 Discharge date: 04/17/2017  Admission Diagnoses: Posterior previa, vaginal bleeding  Discharge Diagnoses: The same  Prenatal Procedures: NST and ultrasound  Hospital Course:  This is a 26 y.o. [id number] with IUP at [redacted]w[redacted]d admitted for first episode of vaginal bleeding in the setting in known of posterior previa.  No significant contractions, no leaking of fluid and she had good fetal movement.  She was initially started on betamethasone x 2 doses.  She was observed, she had no further bleeding, fetal heart rate monitoring remained reassuring, and she had no signs/symptoms of progressing preterm labor or other maternal-fetal concerns.  She was deemed stable for discharge to home with outpatient follow up.  Discharge Exam: Temp:  [98.1 F (36.7 C)-99.3 F (37.4 C)] 98.1 F (36.7 C) (06/12 0802) Pulse Rate:  [78-87] 82 (06/12 0802) Resp:  [16-18] 16 (06/12 0802) BP: (96-112)/(48-80) 102/55 (06/12 0802) SpO2:  [90 %-100 %] 99 % (06/12 0802) Physical Examination: CONSTITUTIONAL: Well-developed, well-nourished female in no acute distress.  HENT:  Normocephalic, atraumatic, External right and left ear normal. Oropharynx is clear and moist EYES: Conjunctivae and EOM are normal. Pupils are equal, round, and reactive to light. No scleral icterus.  NECK: Normal range of motion, supple, no masses SKIN: Skin is warm and dry. No rash noted. Not diaphoretic. No erythema. No pallor. NEUROLGIC: Alert and oriented to person, place, and time. Normal reflexes, muscle tone coordination. No cranial nerve deficit noted. PSYCHIATRIC: Normal mood and affect. Normal behavior. Normal judgment and thought content. CARDIOVASCULAR: Normal heart rate noted, regular rhythm RESPIRATORY: Effort and breath sounds normal, no problems with respiration noted MUSCULOSKELETAL: Normal range of  motion. No edema and no tenderness. 2+ distal pulses. ABDOMEN: Soft, nontender, nondistended, gravid. CERVIX:  Deferred  Fetal monitoring: FHR: 145 bpm, Variability: moderate, Accelerations: Present, Decelerations: Absent  Uterine activity: No contractions   Significant Diagnostic Studies:  Results for orders placed or performed during the hospital encounter of 04/10/17 (from the past 168 hour(s))  CBC   Collection Time: 04/10/17  5:25 PM  Result Value Ref Range   WBC 11.9 (H) 4.0 - 10.5 K/uL   RBC 3.59 (L) 3.87 - 5.11 MIL/uL   Hemoglobin 10.4 (L) 12.0 - 15.0 g/dL   HCT 14.7 (L) 82.9 - 56.2 %   MCV 87.2 78.0 - 100.0 fL   MCH 29.0 26.0 - 34.0 pg   MCHC 33.2 30.0 - 36.0 g/dL   RDW 13.0 86.5 - 78.4 %   Platelets 250 150 - 400 K/uL  CBC   Collection Time: 04/16/17  6:27 PM  Result Value Ref Range   WBC 13.6 (H) 4.0 - 10.5 K/uL   RBC 3.61 (L) 3.87 - 5.11 MIL/uL   Hemoglobin 10.5 (L) 12.0 - 15.0 g/dL   HCT 69.6 (L) 29.5 - 28.4 %   MCV 86.4 78.0 - 100.0 fL   MCH 29.1 26.0 - 34.0 pg   MCHC 33.7 30.0 - 36.0 g/dL   RDW 13.2 44.0 - 10.2 %   Platelets 215 150 - 400 K/uL  Type and screen [facility]   Collection Time: 04/16/17  6:27 PM  Result Value Ref Range   ABO/RH(D) B POS    Antibody Screen NEG    Sample Expiration 04/19/2017   Results for orders placed or performed during the hospital encounter of 04/10/17 (from the past 168 hour(s))  Urinalysis, Routine  w reflex microscopic   Collection Time: 04/10/17  4:55 PM  Result Value Ref Range   Color, Urine YELLOW YELLOW   APPearance HAZY (A) CLEAR   Specific Gravity, Urine 1.017 1.005 - 1.030   pH 6.0 5.0 - 8.0   Glucose, UA NEGATIVE NEGATIVE mg/dL   Hgb urine dipstick MODERATE (A) NEGATIVE   Bilirubin Urine NEGATIVE NEGATIVE   Ketones, ur NEGATIVE NEGATIVE mg/dL   Protein, ur NEGATIVE NEGATIVE mg/dL   Nitrite NEGATIVE NEGATIVE   Leukocytes, UA TRACE (A) NEGATIVE   RBC / HPF 0-5 0 - 5 RBC/hpf   WBC, UA 6-30  0 - 5 WBC/hpf   Bacteria, UA NONE SEEN NONE SEEN   Squamous Epithelial / LPF 6-30 (A) NONE SEEN   Mucous PRESENT   Wet prep, genital   Collection Time: 04/10/17  5:01 PM  Result Value Ref Range   Yeast Wet Prep HPF POC NONE SEEN NONE SEEN   Trich, Wet Prep NONE SEEN NONE SEEN   Clue Cells Wet Prep HPF POC NONE SEEN NONE SEEN   WBC, Wet Prep HPF POC NONE SEEN NONE SEEN   Sperm NONE SEEN    [place] Mfm Ob Follow Up  Result Date: 04/03/2017 ----------------------------------------------------------------------  OBSTETRICS REPORT                      (Signed Final 04/03/2017 04:14 pm) ---------------------------------------------------------------------- Patient Info  ID #:       [id number]                          D.O.B.:  1991/05/23 (26 yrs)  Name:       [name]                  Visit Date: 04/03/2017 03:20 pm ---------------------------------------------------------------------- Performed By  Performed By:     Jodean Lima          Referred By:      MAU Nursing-                    RDMS                                     MAU/Triage  Attending:        Charlsie Merles MD         Location:         [facility] ---------------------------------------------------------------------- Orders   #  Description                                 Code   1  [place] MFM OB FOLLOW UP                         [phone]  ----------------------------------------------------------------------   #  Ordered By               Order #        Accession #    Episode #   1  Alpha Gula            [id number]      [id number]     [id number]  ---------------------------------------------------------------------- Indications   [redacted] weeks gestation of pregnancy  Z3A.26   Placenta previa specified as without           O44.02   hemorrhage, second trimester   Marijuana Abuse   Smoking complicating pregnancy, second         O99.332   trimester   Family history of genetic disorder (child with Z82.89   Williams syndrome)   Late prenatal  care, second trimester           O09.32   Short interval between pregancies, 2nd         O09.892   trimester  ---------------------------------------------------------------------- OB History  Blood Type:            Height:  5'3"   Weight (lb):  142       BMI:  25.15  Gravidity:    5         Term:   4  Living:       4 ---------------------------------------------------------------------- Fetal Evaluation  Num Of Fetuses:     1  Fetal Heart         142  Rate(bpm):  Cardiac Activity:   Observed  Presentation:       Cephalic  Placenta:           Posterior Previa  P. Cord Insertion:  Visualized, central  Amniotic Fluid  AFI FV:      Subjectively within normal limits                              Largest Pocket(cm)                              7.29 ---------------------------------------------------------------------- Biometry  BPD:      63.9  mm     G. Age:  25w 6d         18  %    CI:        73.56   %    70 - 86                                                          FL/HC:      19.1   %    18.6 - 20.4  HC:      236.7  mm     G. Age:  25w 5d          7  %    HC/AC:      1.06        1.05 - 1.21  AC:      224.1  mm     G. Age:  26w 6d         50  %    FL/BPD:     70.6   %    71 - 87  FL:       45.1  mm     G. Age:  25w 0d          4  %    FL/AC:      20.1   %    20 - 24  Est. FW:     877  gm    1 lb 15 oz      40  % ----------------------------------------------------------------------  Gestational Age  LMP:           24w 1d        Date:  10/16/16                 EDD:   07/23/17  U/S Today:     25w 6d                                        EDD:   07/11/17  Best:          26w 4d     Det. By:  U/S  (02/18/17)          EDD:   07/06/17 ---------------------------------------------------------------------- Anatomy  Cranium:               Appears normal         Aortic Arch:            Previously seen  Cavum:                 Appears normal         Ductal Arch:            Previously seen  Ventricles:            Appears normal          Diaphragm:              Appears normal  Choroid Plexus:        Previously seen        Stomach:                Appears normal, left                                                                        sided  Cerebellum:            Previously seen        Abdomen:                Appears normal  Posterior Fossa:       Previously seen        Abdominal Wall:         Previously seen  Nuchal Fold:           Previously seen        Cord Vessels:           Previously seen  Face:                  Orbits and profile     Kidneys:                Appear normal                         previously seen  Lips:                  Appears normal         Bladder:                Appears normal  Thoracic:  Appears normal         Spine:                  Limited views                                                                        previously seen  Heart:                 Previously seen        Upper Extremities:      Previously seen  RVOT:                  Appears normal         Lower Extremities:      Previously seen  LVOT:                  Previously seen  Other:  Fetus appears to be a female. Heels visualized.  Technically difficult          due to fetal position. ---------------------------------------------------------------------- Cervix Uterus Adnexa  Cervix  Length:           4.64  cm.  Normal appearance by transabdominal scan.  Uterus  No abnormality visualized.  Left Ovary  Not visualized.  Right Ovary  Not visualized.  Adnexa:       No abnormality visualized. ---------------------------------------------------------------------- Impression  Single IUP at 26+4 weeks  All fetal anatomy appears normal  The estimated fetal weight is at the 40th percentile   A complete posterior placenta previa continues to be present  Normal amniotic fluid volume ---------------------------------------------------------------------- Recommendations  Repeat scan in 6 weeks to assess placental position  ----------------------------------------------------------------------                 Charlsie Merles, MD Electronically Signed Final Report   04/03/2017 04:14 pm ----------------------------------------------------------------------  [place] Mfm Ob Limited  Result Date: 04/16/2017 ----------------------------------------------------------------------  OBSTETRICS REPORT                      (Signed Final 04/16/2017 01:04 pm) ---------------------------------------------------------------------- Patient Info  ID #:       [id number]                         D.O.B.:   13-Jul-1991 (26 yrs)  Name:       [name]                 Visit Date:  04/16/2017 12:06 pm ---------------------------------------------------------------------- Performed By  Performed By:     Tomma Lightning             Referred By:      3rd Nursing- 3rd                    RDMS,RVT                                 floor [phone]  Attending:        Particia Nearing MD       Location:         [facility] ---------------------------------------------------------------------- Orders   #  Description                                 Code   1  US MFM OB LIMITED                           [id number]  ----------------------------------------------------------------------   #  Ordered By               Order #        Accession #    Episode #   1  Christin BachJOHN FERGUSON            [id number]      [id number]     [id number]  ---------------------------------------------------------------------- Indications   [redacted] weeks gestation of pregnancy                Z3A.28   Smoking complicating pregnancy, third          O99.332   trimester   Family history of genetic disorder (child with 65Z84.89   Williams syndrome)   Late prenatal care, third trimester            O09.32   Short interval between pregancies, third       O09.892   trimester   Placenta previa with hemorrhage, third         O44.13   trimester  ---------------------------------------------------------------------- OB History  Blood Type:             Height:  5'3"   Weight (lb):  142      BMI:   25.15  Gravidity:    5         Term:   4  Living:       4 ---------------------------------------------------------------------- Fetal Evaluation  Num Of Fetuses:     1  Fetal Heart         142  Rate(bpm):  Cardiac Activity:   Observed  Presentation:       Cephalic  Placenta:           Posterior, complete previa  Amniotic Fluid  AFI FV:      Subjectively within normal limits  AFI Sum(cm)     %Tile       Largest Pocket(cm)  10.39           14          4.61  RUQ(cm)       RLQ(cm)       LUQ(cm)        LLQ(cm)  2.36          0.99          2.43           4.61 ---------------------------------------------------------------------- Gestational Age  LMP:           26w 0d       Date:   10/16/16                 EDD:   07/23/17  Best:          Eden Emms28w 3d    Det. By:   U/S  (02/18/17)          EDD:   07/06/17 ---------------------------------------------------------------------- Cervix Uterus Adnexa  Cervix  Length:            4.3  cm.  Normal appearance by transabdominal scan. ---------------------------------------------------------------------- Impression  SIUP at 28+3 weeks  Normal amniotic fluid volume  Complete, central previa; no subchorionic fluid  collections/hemorrhage identified ---------------------------------------------------------------------- Recommendations  Serial ultrasounds for growth  Delivery at 36 weeks ----------------------------------------------------------------------                 Particia Nearing, MD Electronically Signed Final Report   04/16/2017 01:04 pm ----------------------------------------------------------------------   Discharge Condition: Stable  Disposition: Home   Allergies as of 04/17/2017      Reactions   Latex Itching, Swelling      Medication List    TAKE these medications   Prenatal Vitamins 28-0.8 MG Tabs Take 1 tablet by mouth daily.      Follow-up Information    Adam Phenix, MD Follow up on 04/26/2017.    Specialty:  Obstetrics and Gynecology Why:  8:40 am for fasting glucose test, third trimester labs, follow up Contact information: [address] [place] [address] [phone]           Signed: Jaynie Collins M.D. 04/17/2017, 8:52 AM

## 2017-04-17 NOTE — Progress Notes (Signed)
Patient discharged home with family. Discharge paperwork, instructions, follow-up appts, and home care reviewed. Reasons for seeking medical care/returning to the hospital reviewed. Patient verbalized understanding and had no questions.

## 2017-04-17 NOTE — Discharge Instructions (Signed)
Needs to be fasting before appointment on 04/26/17  Placenta Previa Placenta previa is a condition in which the placenta implants in the lower part of the uterus in pregnant women. The placenta either partially or completely covers the opening to the cervix. This is a problem because the baby must pass through the cervix during delivery. There are three types of placenta previa:  Marginal placenta previa. The placenta reaches within an inch (2.5 cm) of the cervical opening but does not cover it.  Partial placenta previa. The placenta covers part of the cervical opening.  Complete placenta previa. The placenta covers the entire cervical opening.  If the previa is marginal or partial and it is diagnosed in the first half of pregnancy, the placenta may move into a normal position as the pregnancy progresses and may no longer cover the cervix. It is important to keep all prenatal visits with your health care provider so you can be more closely monitored. What are the causes? The cause of this condition is not known. What increases the risk? This condition is more likely to develop in women who:  Are carrying more than one baby (multiples).  Have an abnormally shaped uterus.  Have scars on the lining of the uterus.  Have had surgeries involving the uterus, such as a cesarean delivery.  Have delivered a baby before.  Have a history of placenta previa.  Have smoked or used cocaine during pregnancy.  Are age 535 or older during pregnancy.  What are the signs or symptoms? The main symptom of this condition is sudden, painless vaginal bleeding during the second half of pregnancy. The amount of bleeding can be very light at first, and it usually stops on its own. Heavier bleeding episodes may also happen. Some women with placenta previa may have no bleeding at all. How is this diagnosed?  This condition is diagnosed: ? From an ultrasound. This test uses sound waves to find where the placenta  is located before you have any bleeding episodes. ? During a checkup after vaginal bleeding is noticed.  If you are diagnosed with a partial or complete previa, digital exams with fingers will generally be avoided. Your health care provider will still perform a speculum exam.  If you did not have an ultrasound during your pregnancy, placenta previa may not be diagnosed until bleeding occurs during labor. How is this treated? Treatment for this condition may include:  Decreased activity.  Bed rest at home or in the hospital.  Pelvic rest. Nothing is placed inside the vagina during pelvic rest. This means not having sex and not using tampons or douches.  A blood transfusion to replace blood that you have lost (maternal blood loss).  A cesarean delivery. This may be performed if: ? The bleeding is heavy and cannot be controlled. ? The placenta completely covers the cervix.  Medicines to stop premature labor or to help the baby's lungs to mature. This treatment may be used if you need delivery before your pregnancy is full-term.  Your treatment will be decided based on:  How much you are bleeding, or whether the bleeding has stopped.  How far along you are in your pregnancy.  The condition of your baby.  The type of placenta previa that you have.  Follow these instructions at home:  Get plenty of rest and lessen activity as told by your health care provider.  Stay on bed rest for as long as told by your health care provider.  Do not  have sex, use tampons, use a douche, or place anything inside of your vagina if your health care provider recommended pelvic rest.  Take over-the-counter and prescription medicines as told by your health care provider.  Keep all follow-up visits as told by your health care provider. This is important. Get help right away if:  You have vaginal bleeding, even if in small amounts and even if you have no pain.  You have cramping or regular  contractions.  You have pain in your abdomen or your lower back.  You have a feeling of increased pressure in your pelvis.  You have increased watery or bloody mucus from the vagina. This information is not intended to replace advice given to you by your health care provider. Make sure you discuss any questions you have with your health care provider. Document Released: 10/23/2005 Document Revised: 07/12/2016 Document Reviewed: 05/06/2016 Elsevier Interactive Patient Education  [employer].

## 2017-04-25 ENCOUNTER — Telehealth: Payer: Self-pay

## 2017-04-25 NOTE — Telephone Encounter (Signed)
Pt call transferred from the front office and pt informed me that she is having migraines and a sharp pain in her foot that has been intermittent for the past 4 days .  I asked pt if she has tried Tylenol pt stated "no".  I advised pt that she should try Tylenol 1000 mg, taking as instructed, and not more than 3,000 mg.  I ask pt if she is on her feet a lot, and what type of shoes she wears.  Pt stated "I Googled the sharp pain and it stated that it could be fluid coming from my feet up to my heart".  I verified with pt if she is currently having any swelling in her feet.  Pt stated "no, I am not having any swelling in my feet."  Pt then states that she is going to [facility].  I advised pt that because she has an appt tomorrow with a provider that I would try the Tylenol to see if it helps with both the headache and sharp pain in her foot and the provider can evaluate her tomorrow.   Pt stated that she is going to [facility] and phone disconnected.

## 2017-04-26 ENCOUNTER — Encounter: Payer: Medicaid Other | Admitting: Obstetrics and Gynecology

## 2017-04-26 ENCOUNTER — Telehealth: Payer: Self-pay

## 2017-04-26 NOTE — Telephone Encounter (Signed)
Called patient about her missed appointment,patient stated that she rescheduled for 04/26/17 at 11:00am.

## 2017-05-02 ENCOUNTER — Encounter: Payer: Self-pay | Admitting: General Practice

## 2017-05-02 ENCOUNTER — Encounter: Payer: Medicaid Other | Admitting: Obstetrics & Gynecology

## 2017-05-15 ENCOUNTER — Ambulatory Visit (HOSPITAL_COMMUNITY)
Admission: RE | Admit: 2017-05-15 | Discharge: 2017-05-15 | Disposition: A | Discharge status: Home / Self Care | Payer: Medicaid Other | Source: Ambulatory Visit | Attending: Family Medicine | Admitting: Family Medicine

## 2017-05-16 ENCOUNTER — Inpatient Hospital Stay (HOSPITAL_COMMUNITY)
Admission: AD | Admit: 2017-05-16 | Discharge: 2017-05-19 | DRG: 782 | Disposition: A | Discharge status: Home / Self Care | Payer: Medicaid Other | Source: Ambulatory Visit | Attending: Obstetrics and Gynecology | Admitting: Obstetrics and Gynecology

## 2017-05-16 ENCOUNTER — Telehealth: Payer: Self-pay | Admitting: *Deleted

## 2017-05-16 DIAGNOSIS — Z5321 Procedure and treatment not carried out due to patient leaving prior to being seen by health care provider: Secondary | ICD-10-CM | POA: Diagnosis present

## 2017-05-16 DIAGNOSIS — Z87891 Personal history of nicotine dependence: Secondary | ICD-10-CM

## 2017-05-16 DIAGNOSIS — Z3A32 32 weeks gestation of pregnancy: Secondary | ICD-10-CM

## 2017-05-16 DIAGNOSIS — O4403 Placenta previa specified as without hemorrhage, third trimester: Secondary | ICD-10-CM | POA: Diagnosis present

## 2017-05-16 DIAGNOSIS — O4413 Placenta previa with hemorrhage, third trimester: Principal | ICD-10-CM | POA: Diagnosis present

## 2017-05-16 NOTE — [facility]U Note (Signed)
Pt here with c/o vaginal bleeding since this morning, had large blood clot come out, then bleeding slowed down. Then started up again tonight but not as heavy. Has placenta previa and was discharged about 3 weeks ago after spending 10 days here.

## 2017-05-16 NOTE — Telephone Encounter (Signed)
Pt called and wanted to know when her C/S will be scheduled as she was told it will be 1 month before the due date. She also reported having heavy bleeding with clots this morning @ 1030. Since then the bleeding has still been present however is lighter. I advised pt that she will need to discuss her C/S date with the doctor @ her next visit on 7/16. I then advised pt that she needs to come to the hospital ASAP due to the bleeding she has had today. This amount of bleeding requires immediate evaluation of her condition and that of the baby as well. Pt stated that she will come in about 2 hours because she knows she will have to stay in hospital and it is her son's birthday and she is taking him out to eat. I stated that the longer she goes without evaluation could mean a greater chance of long standing problems for the baby. I also stated that if she has another large gush of blood, she should come to hospital immediately. Pt voiced understanding of all information and instructions given.

## 2017-05-17 ENCOUNTER — Inpatient Hospital Stay (HOSPITAL_COMMUNITY): Payer: Medicaid Other

## 2017-05-17 ENCOUNTER — Encounter (HOSPITAL_COMMUNITY): Payer: Self-pay | Admitting: *Deleted

## 2017-05-17 DIAGNOSIS — O4403 Placenta previa specified as without hemorrhage, third trimester: Secondary | ICD-10-CM | POA: Diagnosis present

## 2017-05-17 DIAGNOSIS — Z3A32 32 weeks gestation of pregnancy: Secondary | ICD-10-CM | POA: Diagnosis not present

## 2017-05-17 DIAGNOSIS — O4413 Placenta previa with hemorrhage, third trimester: Secondary | ICD-10-CM | POA: Diagnosis not present

## 2017-05-17 DIAGNOSIS — Z5321 Procedure and treatment not carried out due to patient leaving prior to being seen by health care provider: Secondary | ICD-10-CM | POA: Diagnosis present

## 2017-05-17 DIAGNOSIS — Z3A33 33 weeks gestation of pregnancy: Secondary | ICD-10-CM | POA: Diagnosis not present

## 2017-05-17 DIAGNOSIS — Z87891 Personal history of nicotine dependence: Secondary | ICD-10-CM | POA: Diagnosis not present

## 2017-05-17 LAB — CBC
HEMATOCRIT: 31.2 % — AB (ref 36.0–46.0)
HEMOGLOBIN: 10.4 g/dL — AB (ref 12.0–15.0)
MCH: 28.9 pg (ref 26.0–34.0)
MCHC: 33.3 g/dL (ref 30.0–36.0)
MCV: 86.7 fL (ref 78.0–100.0)
Platelets: 248 10*3/uL (ref 150–400)
RBC: 3.6 MIL/uL — ABNORMAL LOW (ref 3.87–5.11)
RDW: 13.6 % (ref 11.5–15.5)
WBC: 10.4 10*3/uL (ref 4.0–10.5)

## 2017-05-17 LAB — URINALYSIS, ROUTINE W REFLEX MICROSCOPIC
Bilirubin Urine: NEGATIVE
GLUCOSE, UA: NEGATIVE mg/dL
KETONES UR: NEGATIVE mg/dL
NITRITE: NEGATIVE
PH: 6 (ref 5.0–8.0)
PROTEIN: NEGATIVE mg/dL
Specific Gravity, Urine: 1.003 — ABNORMAL LOW (ref 1.005–1.030)

## 2017-05-17 LAB — TYPE AND SCREEN
ABO/RH(D): B POS
Antibody Screen: NEGATIVE

## 2017-05-17 LAB — RPR: RPR: NONREACTIVE

## 2017-05-17 MED ORDER — ACETAMINOPHEN 325 MG PO TABS: 650.0000 mg | ORAL_TABLET | ORAL | Status: DC | PRN

## 2017-05-17 MED ORDER — FERUMOXYTOL INJECTION 510 MG/17 ML
510.0000 mg | Freq: Once | INTRAVENOUS | Status: AC
  Administered 2017-05-17: 510 mg via INTRAVENOUS
  Filled 2017-05-17: qty 17

## 2017-05-17 MED ORDER — SODIUM CHLORIDE 0.9% FLUSH
3.0000 mL | Freq: Two times a day (BID) | INTRAVENOUS | Status: DC
  Administered 2017-05-17 – 2017-05-18 (×2): 3 mL via INTRAVENOUS

## 2017-05-17 MED ORDER — DOCUSATE SODIUM 100 MG PO CAPS
100.0000 mg | ORAL_CAPSULE | Freq: Every day | ORAL | Status: DC
  Filled 2017-05-17 (×3): qty 1

## 2017-05-17 MED ORDER — PRENATAL MULTIVITAMIN CH
1.0000 | ORAL_TABLET | Freq: Every day | ORAL | Status: DC
  Filled 2017-05-17 (×3): qty 1

## 2017-05-17 MED ORDER — SODIUM CHLORIDE 0.9 % IV SOLN: 250.0000 mL | INTRAVENOUS | Status: DC | PRN

## 2017-05-17 MED ORDER — SODIUM CHLORIDE 0.9% FLUSH: 3.0000 mL | INTRAVENOUS | Status: DC | PRN

## 2017-05-17 MED ORDER — CALCIUM CARBONATE ANTACID 500 MG PO CHEW: 2.0000 | CHEWABLE_TABLET | ORAL | Status: DC | PRN

## 2017-05-17 MED ORDER — ZOLPIDEM TARTRATE 5 MG PO TABS: 5.0000 mg | ORAL_TABLET | Freq: Every evening | ORAL | Status: DC | PRN

## 2017-05-17 NOTE — H&P (Signed)
ANTEPARTUM ADMISSION HISTORY AND PHYSICAL NOTE   History of Present Illness: Theresa [name] is a 26 y.o. [id number] at 4736w6d admitted for vaginal bleeding in the presence of a known posterior placenta previa.  Patient reports the fetal movement as active. Patient reports uterine contraction  activity as none. Patient reports  vaginal bleeding as spotting with some clots; at times heavy and at times dark brown.  Patient describes fluid per vagina as None. Fetal presentation is unsure.  Patient Active Problem List   Diagnosis Date Noted  . Placenta previa antepartum in second trimester 04/12/2017  . Vaginal bleeding 04/11/2017  . [redacted] weeks gestation of pregnancy 04/11/2017  . Placenta previa 04/10/2017  . Supervision of high risk pregnancy in second trimester 02/26/2017  . Family history of genetic disorder 02/26/2017  . Complete placenta previa nos or without hemorrhage, second trimester 02/26/2017  . Depression 05/16/2016  . Cigarette smoker 04/18/2016  . 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 Pica 03/15/2016    Past Medical History:  Diagnosis Date  . Chlamydia   . Depression   . Eczema   . Gonorrhea   . Pyelonephritis    X 2    Past Surgical History:  Procedure Laterality Date  . NO PAST SURGERIES      OB History  Gravida Para Term Preterm AB Living  5 4 4     4   SAB TAB Ectopic Multiple Live Births        0 4    # Outcome Date GA Lbr Len/2nd Weight Sex Delivery Anes PTL Lv  5 Current           4 Term 05/16/16 6980w4d 02:17 / 00:11 7 lb 3.7 oz (3.28 kg) M Vag-Spont EPI  LIV     Birth Comments: wnl  3 Term 12/02/14 3458w6d 05:36 / 00:12 5 lb 5.4 oz (2.421 kg) M Vag-Spont EPI  LIV     Birth CommentsMayford Knife: Williams Syndrome  2 Term 02/20/12 4258w6d 16:37 / 01:09 7 lb 5.6 oz (3.335 kg) M Vag-Spont EPI  LIV  1 Term 09/28/09 763w0d  7 lb (3.175 kg) F Vag-Spont EPI N LIV      Social History   Social History  . Marital status: Single    Spouse name: N/A  . Number of children: N/A  .  Years of education: N/A   Social History Main Topics  . Smoking status: Former Smoker    Packs/day: 0.50    Types: Cigarettes  . Smokeless tobacco: Never Used  . Alcohol use Yes     Comment: occas. before knowing of pregnancy  . Drug use: No     Comment: last use one week ago  . Sexual activity: No   Other Topics Concern  . None   Social History Narrative  . None    Family History  Problem Relation Age of Onset  . Asthma Daughter   . Williams syndrome Son   . Diabetes Mother   . Birth defects Neg Hx   . Stroke Neg Hx   . Hypertension Neg Hx   . Anesthesia problems Neg Hx   . Hypotension Neg Hx   . Malignant hyperthermia Neg Hx   . Pseudochol deficiency Neg Hx     Allergies  Allergen Reactions  . Latex Itching and Swelling    Prescriptions Prior to Admission  Medication Sig Dispense Refill Last Dose  . Prenatal Vit-Fe Fumarate-FA (PRENATAL VITAMINS) 28-0.8 MG TABS Take 1 tablet by mouth daily. (Patient not  taking: Reported on 04/03/2017) 30 tablet 12 Not Taking    Review of Systems - Negative except vaginal bleeding.   Vitals:  BP 113/61 (BP Location: Right Arm)   Pulse 97   Temp 98.4 F (36.9 C) (Oral)   Resp 18   Ht 5\' 1"  (1.549 m)   Wt 155 lb (70.3 kg)   LMP 10/16/2016 (Within Weeks)   SpO2 100%   BMI 29.29 kg/m  Physical Examination: CONSTITUTIONAL: Well-developed, well-nourished female in no acute distress.  HENT:  Normocephalic, atraumatic, External right and left ear normal. Oropharynx is clear and moist EYES: Conjunctivae and EOM are normal. Pupils are equal, round, and reactive to light. No scleral icterus.  NECK: Normal range of motion, supple, no masses SKIN: Skin is warm and dry. No rash noted. Not diaphoretic. No erythema. No pallor. NEUROLGIC: Alert and oriented to person, place, and time. Normal reflexes, muscle tone coordination. No cranial nerve deficit noted. PSYCHIATRIC: Normal mood and affect. Normal behavior. Normal judgment and  thought content. CARDIOVASCULAR: Normal heart rate noted, regular rhythm RESPIRATORY: Effort and breath sounds normal, no problems with respiration noted ABDOMEN: Soft, nontender, nondistended, gravid. MUSCULOSKELETAL: Normal range of motion. No edema and no tenderness. 2+ distal pulses.  Cervix: Not evaluated. and found to be not evaluated/ not evaluated/ and fetal presentation is not evaluated. Membranes:intact Fetal Monitoring:Baseline: 150 bpm with moderate variability.  Tocometer: Flat  Labs:  Results for orders placed or performed during the hospital encounter of 05/16/17 (from the past 24 hour(s))  Urinalysis, Routine w reflex microscopic   Collection Time: 05/16/17 11:45 PM  Result Value Ref Range   Color, Urine YELLOW YELLOW   APPearance HAZY (A) CLEAR   Specific Gravity, Urine 1.003 (L) 1.005 - 1.030   pH 6.0 5.0 - 8.0   Glucose, UA NEGATIVE NEGATIVE mg/dL   Hgb urine dipstick LARGE (A) NEGATIVE   Bilirubin Urine NEGATIVE NEGATIVE   Ketones, ur NEGATIVE NEGATIVE mg/dL   Protein, ur NEGATIVE NEGATIVE mg/dL   Nitrite NEGATIVE NEGATIVE   Leukocytes, UA LARGE (A) NEGATIVE   RBC / HPF 6-30 0 - 5 RBC/hpf   WBC, UA 6-30 0 - 5 WBC/hpf   Bacteria, UA RARE (A) NONE SEEN   Squamous Epithelial / LPF 6-30 (A) NONE SEEN   Mucous PRESENT     Imaging Studies: No results found.   Assessment and Plan: Patient Active Problem List   Diagnosis Date Noted  . Placenta previa antepartum in second trimester 04/12/2017  . Vaginal bleeding 04/11/2017  . [redacted] weeks gestation of pregnancy 04/11/2017  . Placenta previa 04/10/2017  . Supervision of high risk pregnancy in second trimester 02/26/2017  . Family history of genetic disorder 02/26/2017  . Complete placenta previa nos or without hemorrhage, second trimester 02/26/2017  . Depression 05/16/2016  . Cigarette smoker 04/18/2016  . 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 Pica 03/15/2016   Admit to Antenatal Routine antenatal care Patient to  have [place] on 05-17-2017.  Patient is s/p BMZ  Marylene Land, CNM   Faculty Practice, [facility]

## 2017-05-17 NOTE — Progress Notes (Signed)
MD notified about variables, MD okay with strip and can take pt off monitor to send to ultrasound.

## 2017-05-17 NOTE — Progress Notes (Signed)
FACULTY PRACTICE ANTEPARTUM(COMPREHENSIVE) NOTE  Theresa [name] is a 26 y.o. [id number] at [redacted]w[redacted]d by early ultrasound who is admitted for second episode of bleeding from a posterior placenta previa. Pt passed a few clots then has reduced to light amounts of dark blood. .   Fetal presentation is unsure. Usually cephalic Length of Stay:  0  Days  Subjective: Denies cramping Patient reports the fetal movement as active. Patient reports uterine contraction  activity as none. Patient reports  vaginal bleeding as spotting. Patient describes fluid per vagina as None.  Vitals:  Blood pressure (!) 104/50, pulse 88, temperature 98.4 F (36.9 C), temperature source Oral, resp. rate 18, height 5\' 1"  (1.549 m), weight 155 lb (70.3 kg), last menstrual period 10/16/2016, SpO2 100 %, unknown if currently breastfeeding. Physical Examination:  General appearance - alert, well appearing, and in no distress Heart - normal rate and regular rhythm Abdomen - soft, nontender, nondistended Fundal Height:  size equals datestal presentation is unsure. Extremities: extremities normal, atraumatic, no cyanosis or edema and Homans sign is negative, no sign of DVT with DTRs 2+ bilaterally Membranes:intact  Fetal Monitoring:  Baseline: 150 bpm, Variability: Fair (1-6 bpm), Accelerations: Non-reactive but appropriate for gestational age, Decelerations: Absent and no clots this am.  Labs:  Results for orders placed or performed during the hospital encounter of 05/16/17 (from the past 24 hour(s))  Urinalysis, Routine w reflex microscopic   Collection Time: 05/16/17 11:45 PM  Result Value Ref Range   Color, Urine YELLOW YELLOW   APPearance HAZY (A) CLEAR   Specific Gravity, Urine 1.003 (L) 1.005 - 1.030   pH 6.0 5.0 - 8.0   Glucose, UA NEGATIVE NEGATIVE mg/dL   Hgb urine dipstick LARGE (A) NEGATIVE   Bilirubin Urine NEGATIVE NEGATIVE   Ketones, ur NEGATIVE NEGATIVE mg/dL   Protein, ur NEGATIVE NEGATIVE mg/dL   Nitrite NEGATIVE NEGATIVE   Leukocytes, UA LARGE (A) NEGATIVE   RBC / HPF 6-30 0 - 5 RBC/hpf   WBC, UA 6-30 0 - 5 WBC/hpf   Bacteria, UA RARE (A) NONE SEEN   Squamous Epithelial / LPF 6-30 (A) NONE SEEN   Mucous PRESENT   CBC on admission   Collection Time: 05/17/17 12:20 AM  Result Value Ref Range   WBC 10.4 4.0 - 10.5 K/uL   RBC 3.60 (L) 3.87 - 5.11 MIL/uL   Hemoglobin 10.4 (L) 12.0 - 15.0 g/dL   HCT 45.4 (L) 09.8 - 11.9 %   MCV 86.7 78.0 - 100.0 fL   MCH 28.9 26.0 - 34.0 pg   MCHC 33.3 30.0 - 36.0 g/dL   RDW 14.7 82.9 - 56.2 %   Platelets 248 150 - 400 K/uL  Type and screen [facility]    Collection Time: 05/17/17 12:20 AM  Result Value Ref Range   ABO/RH(D) B POS    Antibody Screen NEG    Sample Expiration 05/20/2017     Imaging Studies:       Medications:  Scheduled . docusate sodium  100 mg Oral Daily  . prenatal multivitamin  1 tablet Oral Q1200  . sodium chloride flush  3 mL Intravenous Q12H   I have reviewed the patient's current medications.  ASSESSMENT: Patient Active Problem List   Diagnosis Date Noted  . Placenta previa antepartum in third trimester 05/17/2017  . Placenta previa antepartum in second trimester 04/12/2017  . Vaginal bleeding 04/11/2017  . [redacted] weeks gestation of pregnancy 04/11/2017  . Placenta previa 04/10/2017  .  Supervision of high risk pregnancy in second trimester 02/26/2017  . Family history of genetic disorder 02/26/2017  . Complete placenta previa nos or without hemorrhage, second trimester 02/26/2017  . Depression 05/16/2016  . Cigarette smoker 04/18/2016  . 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 Pica 03/15/2016    PLAN: Repeat u/s this am. Has completed Betamethasone 1st round Too far along for Mag for neuroprotection Bedrest in hospital til 1wk, consider staying til delivery as this is second admit for bleeding. Consider IV iron   [name],[name] 05/17/2017,6:16 AM    Patient ID: Theresa [name], female   DOB:  04-10-1991, 26 y.o.   MRN: [id number]

## 2017-05-18 DIAGNOSIS — Z3A33 33 weeks gestation of pregnancy: Secondary | ICD-10-CM

## 2017-05-18 DIAGNOSIS — O4403 Placenta previa specified as without hemorrhage, third trimester: Secondary | ICD-10-CM

## 2017-05-18 NOTE — Plan of Care (Signed)
Pt. W/ family in room. Pt.'s family preparing to leave and pt. Stated she wanted to walk out with them. Advised pt. Orders would need to be reviewed regarding her permission to or not to leave the unit. Pt. Does not have active wheelchair ride orders, and was advised not to leave. Pt. stated she was going to ride out with family anyhow. Vitals checked before pt. Left in wheelchair. VSS.

## 2017-05-18 NOTE — Progress Notes (Signed)
Patient ID: Jack QuartoJamel L Vandenheuvel, female   DOB: [birth year], 26 y.o.   MRN: [id number] FACULTY PRACTICE ANTEPARTUM(COMPREHENSIVE) NOTE  Jack QuartoJamel L Bennette is a 26 y.o. [id number] at 1743w0d who is admitted for bleeding with placenta previa.   Fetal presentation is cephalic. Length of Stay:  1  Days  Subjective:  Patient reports the fetal movement as active. Patient reports uterine contraction  activity as none. Patient reports  vaginal bleeding as less flow than a normal period. Patient describes fluid per vagina as None.  Vitals:  Blood pressure (!) 91/44, pulse 77, temperature 98.8 F (37.1 C), temperature source Oral, resp. rate 17, height 5\' 1"  (1.549 m), weight 155 lb (70.3 kg), last menstrual period 10/16/2016, SpO2 100 %, unknown if currently breastfeeding. Physical Examination:  General appearance - alert, well appearing, and in no distress Heart - normal rate and regular rhythm Abdomen - soft, nontender, nondistended Fundal Height:  size equals dates Cervical Exam: Not evaluated. Extremities: extremities normal, atraumatic, no cyanosis or edema and Homans sign is negative, no sign of DVT Membranes:intact, clear fluid  Fetal Monitoring:     Fetal Heart Rate A  Mode External filed at 05/17/2017 2230  Baseline Rate (A) 145 bpm filed at 05/18/2017 1310  Variability 6-25 BPM filed at 05/18/2017 1310  Accelerations 15 x 15 filed at 05/18/2017 1310  Decelerations None filed at 05/18/2017 1310     Labs:  No results found for this or any previous visit (from the past 24 hour(s)).    Medications:  Scheduled . docusate sodium  100 mg Oral Daily  . prenatal multivitamin  1 tablet Oral Q1200  . sodium chloride flush  3 mL Intravenous Q12H   I have reviewed the patient's current medications.  ASSESSMENT: Patient Active Problem List   Diagnosis Date Noted  . Placenta previa antepartum in third trimester 05/17/2017  . Placenta previa antepartum in second trimester 04/12/2017  . Vaginal  bleeding 04/11/2017  . [redacted] weeks gestation of pregnancy 04/11/2017  . Placenta previa 04/10/2017  . Supervision of high risk pregnancy in second trimester 02/26/2017  . Family history of genetic disorder 02/26/2017  . Complete placenta previa nos or without hemorrhage, second trimester 02/26/2017  . Depression 05/16/2016  . Cigarette smoker 04/18/2016  . 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 Pica 03/15/2016    PLAN: Observe for increased vaginal bleeding. She states that bleeding is decreased  Scheryl DarterJames Binyamin Nelis 05/18/2017,1:16 PM

## 2017-05-18 NOTE — Plan of Care (Signed)
Phoned Dr. Despina HiddenEure, that pt. Had left and smoked. Order for wheelchair privileges entered, per verbal order.  Pt. Asked if she wanted a nicotine patch and she does not. Stated they make her sick.

## 2017-05-19 ENCOUNTER — Inpatient Hospital Stay (HOSPITAL_COMMUNITY)
Admission: AD | Admit: 2017-05-19 | Discharge: 2017-05-22 | DRG: 765 | Disposition: A | Discharge status: Home / Self Care | Payer: Medicaid Other | Source: Ambulatory Visit | Attending: Obstetrics and Gynecology | Admitting: Obstetrics and Gynecology

## 2017-05-19 DIAGNOSIS — Z87891 Personal history of nicotine dependence: Secondary | ICD-10-CM

## 2017-05-19 DIAGNOSIS — O4403 Placenta previa specified as without hemorrhage, third trimester: Secondary | ICD-10-CM

## 2017-05-19 DIAGNOSIS — O4413 Placenta previa with hemorrhage, third trimester: Principal | ICD-10-CM | POA: Diagnosis present

## 2017-05-19 DIAGNOSIS — O4400 Placenta previa specified as without hemorrhage, unspecified trimester: Secondary | ICD-10-CM | POA: Diagnosis present

## 2017-05-19 DIAGNOSIS — Z302 Encounter for sterilization: Secondary | ICD-10-CM

## 2017-05-19 DIAGNOSIS — Z91040 Latex allergy status: Secondary | ICD-10-CM

## 2017-05-19 DIAGNOSIS — Z3A32 32 weeks gestation of pregnancy: Secondary | ICD-10-CM

## 2017-05-19 NOTE — Progress Notes (Signed)
Pt left AMA. Dr. Alysia PennaErvin came to bedside to educated patient on the risks of leaving. Pt refused fetal monitoring before leaving. Pt states that she has social issues that she needs to deal with. Pt states that she was planning on returning for readmission. Carmelina DaneERRI L Skyley Grandmaison, RN

## 2017-05-19 NOTE — Progress Notes (Signed)
Patient ID: Theresa [name], female   DOB: 1990-12-29, 26 y.o.   MRN: [id number] FACULTY PRACTICE ANTEPARTUM(COMPREHENSIVE) NOTE  Theresa [name] is a 26 y.o. [id number] with Estimated Date of Delivery: 07/06/17    4460w1d  who is admitted for bleeding with a placenta previa.    Fetal presentation is cephalic. Length of Stay:  2  Days  Date of admission:05/16/2017  Subjective: Denies any problems specifically bleeding Patient reports the fetal movement as active. Patient reports uterine contraction  activity as none. Patient reports  vaginal bleeding as none. Patient describes fluid per vagina as None.  Vitals:  Blood pressure (!) 96/50, pulse 75, temperature 98.5 F (36.9 C), temperature source Oral, resp. rate 18, height 5\' 1"  (1.549 m), weight 155 lb (70.3 kg), last menstrual period 10/16/2016, SpO2 100 %, unknown if currently breastfeeding. Vitals:   05/18/17 2322 05/18/17 2325 05/18/17 2327 05/19/17 0417  BP:   (!) 97/51 (!) 96/50  Pulse:   82 75  Resp: 18  18 18   Temp: 98.3 F (36.8 C)  98.3 F (36.8 C) 98.5 F (36.9 C)  TempSrc: Oral  Oral Oral  SpO2:  98% 99% 100%  Weight:      Height:       Physical Examination:  General appearance - alert, well appearing, and in no distress Abdomen - soft, nontender, nondistended, no masses or organomegaly Fundal Height:  size equals dates Pelvic Exam:  deferred  Extremities: extremities normal, atraumatic, no cyanosis or edema with DTRs 2+ bilaterally Membranes:intact  Fetal Monitoring:  Baseline: 135 bpm, Variability: Good {> 6 bpm) and Accelerations: Reactive     Labs:  No results found for this or any previous visit (from the past 24 hour(s)).  Imaging Studies:    No new  Medications:  Scheduled . docusate sodium  100 mg Oral Daily  . prenatal multivitamin  1 tablet Oral Q1200  . sodium chloride flush  3 mL Intravenous Q12H   I have reviewed the patient's current medications.  ASSESSMENT: [id number] 9960w1d Estimated Date of  Delivery: 07/06/17  Complete previa, 2nd significant bleeding episode, no current active bleeding Patient Active Problem List   Diagnosis Date Noted  . Placenta previa antepartum in third trimester 05/17/2017  . Placenta previa antepartum in second trimester 04/12/2017  . Vaginal bleeding 04/11/2017  . [redacted] weeks gestation of pregnancy 04/11/2017  . Placenta previa 04/10/2017  . Supervision of high risk pregnancy in second trimester 02/26/2017  . Family history of genetic disorder 02/26/2017  . Complete placenta previa nos or without hemorrhage, second trimester 02/26/2017  . Depression 05/16/2016  . Cigarette smoker 04/18/2016  . 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 Pica 03/15/2016    PLAN: Continue in house observation for vaginal bleeding until 36 weeks or otherwise requires delivery Deliver by C section if another significant episode ensues  Katryn Plummer H 05/19/2017,8:06 AM

## 2017-05-19 NOTE — Progress Notes (Signed)
Called to patient's room. Pt seems to be having turmoil at home. Asking to see the doctor, stating she wants to leave. Pt reassured and education about the importance of staying in the hospital. Pt is still asking for the doctor. Dr. Alysia PennaErvin notified. Carmelina DaneERRI L Bisma Klett, RN

## 2017-05-19 NOTE — Progress Notes (Signed)
Patient ID: Theresa [name], female   DOB: 1991-10-08, 26 y.o.   MRN: [id number] CTSP by nursing d/t pt wanting to leave. Spoke with pt. She reports that she has some issues that she needs to attend to at home with her house and son's birthday party. Asked the pt if there was anything I could do to help and she stated no. Pt advised that she would be leaving AMA. Risks of her leaving including but not limited to maternal and or fetal death, premature delivery and sequale of prematurity reviewed with pt. Pt verbalized understanding. She stated she may be able to return later today for readmission. Pt signed AMA papers and walked off the floor.

## 2017-05-20 ENCOUNTER — Encounter (HOSPITAL_COMMUNITY)
Admission: AD | Disposition: A | Discharge status: Home / Self Care | Payer: Self-pay | Source: Ambulatory Visit | Attending: Obstetrics and Gynecology

## 2017-05-20 ENCOUNTER — Inpatient Hospital Stay (HOSPITAL_COMMUNITY): Payer: Medicaid Other | Admitting: Anesthesiology

## 2017-05-20 ENCOUNTER — Encounter (HOSPITAL_COMMUNITY): Payer: Self-pay | Admitting: *Deleted

## 2017-05-20 DIAGNOSIS — Z91040 Latex allergy status: Secondary | ICD-10-CM | POA: Diagnosis not present

## 2017-05-20 DIAGNOSIS — O4413 Placenta previa with hemorrhage, third trimester: Secondary | ICD-10-CM | POA: Diagnosis present

## 2017-05-20 DIAGNOSIS — Z87891 Personal history of nicotine dependence: Secondary | ICD-10-CM | POA: Diagnosis not present

## 2017-05-20 DIAGNOSIS — Z302 Encounter for sterilization: Secondary | ICD-10-CM

## 2017-05-20 DIAGNOSIS — Z3A32 32 weeks gestation of pregnancy: Secondary | ICD-10-CM

## 2017-05-20 HISTORY — PX: TUBAL LIGATION: SHX77

## 2017-05-20 LAB — CBC
HEMATOCRIT: 28.2 % — AB (ref 36.0–46.0)
HEMOGLOBIN: 9.5 g/dL — AB (ref 12.0–15.0)
MCH: 28.9 pg (ref 26.0–34.0)
MCHC: 33.7 g/dL (ref 30.0–36.0)
MCV: 85.7 fL (ref 78.0–100.0)
Platelets: 228 10*3/uL (ref 150–400)
RBC: 3.29 MIL/uL — AB (ref 3.87–5.11)
RDW: 13.6 % (ref 11.5–15.5)
WBC: 10.1 10*3/uL (ref 4.0–10.5)

## 2017-05-20 LAB — TYPE AND SCREEN
ABO/RH(D): B POS
Antibody Screen: NEGATIVE

## 2017-05-20 SURGERY — LIGATION, FALLOPIAN TUBE, BILATERAL
Anesthesia: Spinal | Wound class: Clean

## 2017-05-20 MED ORDER — SODIUM CHLORIDE 0.9% FLUSH: 3.0000 mL | INTRAVENOUS | Status: DC | PRN

## 2017-05-20 MED ORDER — DIBUCAINE 1 % RE OINT: 1.0000 "application " | TOPICAL_OINTMENT | RECTAL | Status: DC | PRN

## 2017-05-20 MED ORDER — FENTANYL CITRATE (PF) 100 MCG/2ML IJ SOLN
INTRAMUSCULAR | Status: DC | PRN
  Administered 2017-05-20: 40 ug via INTRAVENOUS
  Administered 2017-05-20: 50 ug via INTRAVENOUS
  Administered 2017-05-20: 10 ug via INTRATHECAL

## 2017-05-20 MED ORDER — OXYTOCIN 10 UNIT/ML IJ SOLN
INTRAMUSCULAR | Status: AC
  Filled 2017-05-20: qty 4

## 2017-05-20 MED ORDER — SENNOSIDES-DOCUSATE SODIUM 8.6-50 MG PO TABS
2.0000 | ORAL_TABLET | ORAL | Status: DC
  Administered 2017-05-21 – 2017-05-22 (×2): 2 via ORAL
  Filled 2017-05-20 (×2): qty 2

## 2017-05-20 MED ORDER — MORPHINE SULFATE (PF) 0.5 MG/ML IJ SOLN
INTRAMUSCULAR | Status: AC
  Filled 2017-05-20: qty 10

## 2017-05-20 MED ORDER — SIMETHICONE 80 MG PO CHEW
80.0000 mg | CHEWABLE_TABLET | ORAL | Status: DC
  Administered 2017-05-21 – 2017-05-22 (×2): 80 mg via ORAL
  Filled 2017-05-20 (×2): qty 1

## 2017-05-20 MED ORDER — PRENATAL MULTIVITAMIN CH
1.0000 | ORAL_TABLET | Freq: Every day | ORAL | Status: DC
  Administered 2017-05-21 – 2017-05-22 (×2): 1 via ORAL
  Filled 2017-05-20 (×2): qty 1

## 2017-05-20 MED ORDER — LACTATED RINGERS IV SOLN
INTRAVENOUS | Status: DC
  Administered 2017-05-20 (×2): via INTRAVENOUS

## 2017-05-20 MED ORDER — WITCH HAZEL-GLYCERIN EX PADS: 1.0000 "application " | MEDICATED_PAD | CUTANEOUS | Status: DC | PRN

## 2017-05-20 MED ORDER — CALCIUM CARBONATE ANTACID 500 MG PO CHEW: 2.0000 | CHEWABLE_TABLET | ORAL | Status: DC | PRN

## 2017-05-20 MED ORDER — ZOLPIDEM TARTRATE 5 MG PO TABS: 5.0000 mg | ORAL_TABLET | Freq: Every evening | ORAL | Status: DC | PRN

## 2017-05-20 MED ORDER — PRENATAL MULTIVITAMIN CH: 1.0000 | ORAL_TABLET | Freq: Every day | ORAL | Status: DC

## 2017-05-20 MED ORDER — KETOROLAC TROMETHAMINE 30 MG/ML IJ SOLN: 30.0000 mg | Freq: Four times a day (QID) | INTRAMUSCULAR | Status: DC | PRN

## 2017-05-20 MED ORDER — ACETAMINOPHEN 325 MG PO TABS
650.0000 mg | ORAL_TABLET | ORAL | Status: DC | PRN
Start: 2017-05-20 — End: 2017-05-20
  Filled 2017-05-20: qty 2

## 2017-05-20 MED ORDER — ONDANSETRON HCL 4 MG/2ML IJ SOLN
INTRAMUSCULAR | Status: DC | PRN
  Administered 2017-05-20: 4 mg via INTRAVENOUS

## 2017-05-20 MED ORDER — SIMETHICONE 80 MG PO CHEW
80.0000 mg | CHEWABLE_TABLET | Freq: Three times a day (TID) | ORAL | Status: DC
  Administered 2017-05-20 – 2017-05-22 (×6): 80 mg via ORAL
  Filled 2017-05-20 (×6): qty 1

## 2017-05-20 MED ORDER — SIMETHICONE 80 MG PO CHEW: 80.0000 mg | CHEWABLE_TABLET | ORAL | Status: DC | PRN

## 2017-05-20 MED ORDER — TETANUS-DIPHTH-ACELL PERTUSSIS 5-2.5-18.5 LF-MCG/0.5 IM SUSP: 0.5000 mL | Freq: Once | INTRAMUSCULAR | Status: DC

## 2017-05-20 MED ORDER — NALBUPHINE SYRINGE 5 MG/0.5 ML
5.0000 mg | INJECTION | Freq: Once | INTRAMUSCULAR | Status: DC | PRN
  Filled 2017-05-20: qty 0.5

## 2017-05-20 MED ORDER — LACTATED RINGERS IV SOLN
INTRAVENOUS | Status: DC | PRN
  Administered 2017-05-20: 40 [IU] via INTRAVENOUS

## 2017-05-20 MED ORDER — SCOPOLAMINE 1 MG/3DAYS TD PT72: 1.0000 | MEDICATED_PATCH | Freq: Once | TRANSDERMAL | Status: DC

## 2017-05-20 MED ORDER — FENTANYL CITRATE (PF) 100 MCG/2ML IJ SOLN
INTRAMUSCULAR | Status: AC
  Filled 2017-05-20: qty 2

## 2017-05-20 MED ORDER — TERBUTALINE SULFATE 1 MG/ML IJ SOLN
0.2500 mg | Freq: Once | INTRAMUSCULAR | Status: AC
  Administered 2017-05-20: 0.25 mg via SUBCUTANEOUS
  Filled 2017-05-20: qty 1

## 2017-05-20 MED ORDER — MIDAZOLAM HCL 2 MG/2ML IJ SOLN
INTRAMUSCULAR | Status: AC
  Filled 2017-05-20: qty 2

## 2017-05-20 MED ORDER — DOCUSATE SODIUM 100 MG PO CAPS: 100.0000 mg | ORAL_CAPSULE | Freq: Every day | ORAL | Status: DC

## 2017-05-20 MED ORDER — LACTATED RINGERS IV SOLN
INTRAVENOUS | Status: DC
Start: 2017-05-20 — End: 2017-05-21
  Administered 2017-05-20 – 2017-05-21 (×2): via INTRAVENOUS

## 2017-05-20 MED ORDER — ACETAMINOPHEN 325 MG PO TABS: 650.0000 mg | ORAL_TABLET | ORAL | Status: DC | PRN

## 2017-05-20 MED ORDER — DIPHENHYDRAMINE HCL 25 MG PO CAPS
25.0000 mg | ORAL_CAPSULE | ORAL | Status: DC | PRN
  Filled 2017-05-20: qty 1

## 2017-05-20 MED ORDER — PHENYLEPHRINE 8 MG IN D5W 100 ML (0.08MG/ML) PREMIX OPTIME
INJECTION | INTRAVENOUS | Status: AC
  Filled 2017-05-20: qty 100

## 2017-05-20 MED ORDER — IBUPROFEN 600 MG PO TABS
600.0000 mg | ORAL_TABLET | Freq: Four times a day (QID) | ORAL | Status: DC
  Administered 2017-05-20 – 2017-05-22 (×8): 600 mg via ORAL
  Filled 2017-05-20 (×8): qty 1

## 2017-05-20 MED ORDER — NALOXONE HCL 2 MG/2ML IJ SOSY
1.0000 ug/kg/h | PREFILLED_SYRINGE | INTRAVENOUS | Status: DC | PRN
  Filled 2017-05-20: qty 2

## 2017-05-20 MED ORDER — FENTANYL CITRATE (PF) 100 MCG/2ML IJ SOLN
25.0000 ug | INTRAMUSCULAR | Status: DC | PRN
  Administered 2017-05-20 (×2): 50 ug via INTRAVENOUS

## 2017-05-20 MED ORDER — PHENYLEPHRINE 8 MG IN D5W 100 ML (0.08MG/ML) PREMIX OPTIME
INJECTION | INTRAVENOUS | Status: DC | PRN
  Administered 2017-05-20: 60 ug/min via INTRAVENOUS

## 2017-05-20 MED ORDER — METOCLOPRAMIDE HCL 5 MG/ML IJ SOLN: 10.0000 mg | Freq: Once | INTRAMUSCULAR | Status: DC | PRN

## 2017-05-20 MED ORDER — OXYCODONE-ACETAMINOPHEN 5-325 MG PO TABS: 1.0000 | ORAL_TABLET | ORAL | Status: DC | PRN

## 2017-05-20 MED ORDER — ONDANSETRON HCL 4 MG/2ML IJ SOLN: 4.0000 mg | Freq: Three times a day (TID) | INTRAMUSCULAR | Status: DC | PRN

## 2017-05-20 MED ORDER — OXYTOCIN 40 UNITS IN LACTATED RINGERS INFUSION - SIMPLE MED: 2.5000 [IU]/h | INTRAVENOUS | Status: AC

## 2017-05-20 MED ORDER — LACTATED RINGERS IV BOLUS (SEPSIS): 1000.0000 mL | Freq: Once | INTRAVENOUS | Status: DC

## 2017-05-20 MED ORDER — DIPHENHYDRAMINE HCL 25 MG PO CAPS: 25.0000 mg | ORAL_CAPSULE | Freq: Four times a day (QID) | ORAL | Status: DC | PRN

## 2017-05-20 MED ORDER — LACTATED RINGERS IV SOLN: INTRAVENOUS | Status: DC

## 2017-05-20 MED ORDER — MORPHINE SULFATE (PF) 0.5 MG/ML IJ SOLN
INTRAMUSCULAR | Status: DC | PRN
  Administered 2017-05-20: .2 mg via INTRATHECAL

## 2017-05-20 MED ORDER — NALBUPHINE SYRINGE 5 MG/0.5 ML
5.0000 mg | INJECTION | INTRAMUSCULAR | Status: DC | PRN
  Filled 2017-05-20: qty 0.5

## 2017-05-20 MED ORDER — COCONUT OIL OIL: 1.0000 "application " | TOPICAL_OIL | Status: DC | PRN

## 2017-05-20 MED ORDER — NALOXONE HCL 0.4 MG/ML IJ SOLN: 0.4000 mg | INTRAMUSCULAR | Status: DC | PRN

## 2017-05-20 MED ORDER — OXYCODONE-ACETAMINOPHEN 5-325 MG PO TABS
2.0000 | ORAL_TABLET | ORAL | Status: DC | PRN
  Administered 2017-05-21 – 2017-05-22 (×4): 2 via ORAL
  Filled 2017-05-20 (×4): qty 2

## 2017-05-20 MED ORDER — CEFAZOLIN SODIUM-DEXTROSE 2-3 GM-% IV SOLR
INTRAVENOUS | Status: DC | PRN
  Administered 2017-05-20: 2 g via INTRAVENOUS

## 2017-05-20 MED ORDER — SOD CITRATE-CITRIC ACID 500-334 MG/5ML PO SOLN
ORAL | Status: AC
  Administered 2017-05-20: 30 mL
  Filled 2017-05-20: qty 15

## 2017-05-20 MED ORDER — CEFAZOLIN SODIUM-DEXTROSE 2-4 GM/100ML-% IV SOLN: 2.0000 g | Freq: Once | INTRAVENOUS | Status: DC

## 2017-05-20 MED ORDER — BUPIVACAINE IN DEXTROSE 0.75-8.25 % IT SOLN
INTRATHECAL | Status: AC
  Filled 2017-05-20: qty 2

## 2017-05-20 MED ORDER — ONDANSETRON HCL 4 MG/2ML IJ SOLN
INTRAMUSCULAR | Status: AC
  Filled 2017-05-20: qty 2

## 2017-05-20 MED ORDER — MENTHOL 3 MG MT LOZG: 1.0000 | LOZENGE | OROMUCOSAL | Status: DC | PRN

## 2017-05-20 MED ORDER — LACTATED RINGERS IV SOLN
INTRAVENOUS | Status: DC | PRN
  Administered 2017-05-20: 13:00:00 via INTRAVENOUS

## 2017-05-20 MED ORDER — DIPHENHYDRAMINE HCL 50 MG/ML IJ SOLN: 12.5000 mg | INTRAMUSCULAR | Status: DC | PRN

## 2017-05-20 MED ORDER — ACETAMINOPHEN 500 MG PO TABS: 1000.0000 mg | ORAL_TABLET | Freq: Four times a day (QID) | ORAL | Status: DC

## 2017-05-20 MED ORDER — METHYLERGONOVINE MALEATE 0.2 MG/ML IJ SOLN: 0.2000 mg | INTRAMUSCULAR | Status: DC | PRN

## 2017-05-20 MED ORDER — SODIUM CHLORIDE 0.9 % IR SOLN
Status: DC | PRN
  Administered 2017-05-20: 1000 mL

## 2017-05-20 MED ORDER — MEPERIDINE HCL 25 MG/ML IJ SOLN: 6.2500 mg | INTRAMUSCULAR | Status: DC | PRN

## 2017-05-20 MED ORDER — BUPIVACAINE IN DEXTROSE 0.75-8.25 % IT SOLN
INTRATHECAL | Status: DC | PRN
  Administered 2017-05-20: 1.4 mL via INTRATHECAL

## 2017-05-20 MED ORDER — LACTATED RINGERS IV SOLN
INTRAVENOUS | Status: DC | PRN
  Administered 2017-05-20 (×3): via INTRAVENOUS

## 2017-05-20 MED ORDER — METHYLERGONOVINE MALEATE 0.2 MG PO TABS: 0.2000 mg | ORAL_TABLET | ORAL | Status: DC | PRN

## 2017-05-20 SURGICAL SUPPLY — 41 items
ADH SKN CLS APL DERMABOND .7 (GAUZE/BANDAGES/DRESSINGS) ×2
CHLORAPREP W/TINT 26ML (MISCELLANEOUS) ×8 IMPLANT
CLAMP CORD UMBIL (MISCELLANEOUS) ×2 IMPLANT
CLOTH BEACON ORANGE TIMEOUT ST (SAFETY) ×4 IMPLANT
DERMABOND ADVANCED (GAUZE/BANDAGES/DRESSINGS) ×2
DERMABOND ADVANCED .7 DNX12 (GAUZE/BANDAGES/DRESSINGS) ×4 IMPLANT
DRSG OPSITE POSTOP 4X10 (GAUZE/BANDAGES/DRESSINGS) ×4 IMPLANT
ELECT REM PT RETURN 9FT ADLT (ELECTROSURGICAL) ×4
ELECTRODE REM PT RTRN 9FT ADLT (ELECTROSURGICAL) ×2 IMPLANT
EXTRACTOR VACUUM BELL STYLE (SUCTIONS) IMPLANT
GLOVE BIOGEL PI IND STRL 7.0 (GLOVE) ×2 IMPLANT
GLOVE BIOGEL PI IND STRL 8 (GLOVE) ×2 IMPLANT
GLOVE BIOGEL PI INDICATOR 7.0 (GLOVE) ×2
GLOVE BIOGEL PI INDICATOR 8 (GLOVE) ×2
GLOVE ECLIPSE 8.0 STRL XLNG CF (GLOVE) ×4 IMPLANT
GOWN STRL REUS W/TWL LRG LVL3 (GOWN DISPOSABLE) ×8 IMPLANT
KIT ABG SYR 3ML LUER SLIP (SYRINGE) ×4 IMPLANT
NDL HYPO 18GX1.5 BLUNT FILL (NEEDLE) ×2 IMPLANT
NDL HYPO 25X5/8 SAFETYGLIDE (NEEDLE) ×2 IMPLANT
NEEDLE HYPO 18GX1.5 BLUNT FILL (NEEDLE) ×4 IMPLANT
NEEDLE HYPO 22GX1.5 SAFETY (NEEDLE) ×4 IMPLANT
NEEDLE HYPO 25X5/8 SAFETYGLIDE (NEEDLE) ×4 IMPLANT
NS IRRIG 1000ML POUR BTL (IV SOLUTION) ×4 IMPLANT
PACK C SECTION WH (CUSTOM PROCEDURE TRAY) ×4 IMPLANT
PAD OB MATERNITY 4.3X12.25 (PERSONAL CARE ITEMS) ×6 IMPLANT
PENCIL SMOKE EVAC W/HOLSTER (ELECTROSURGICAL) ×4 IMPLANT
RTRCTR C-SECT PINK 25CM LRG (MISCELLANEOUS) IMPLANT
SUT CHROMIC 0 CT 1 (SUTURE) ×4 IMPLANT
SUT GUT PLAIN 0 CT-3 TAN 27 (SUTURE) ×4 IMPLANT
SUT MNCRL 0 VIOLET CTX 36 (SUTURE) ×4 IMPLANT
SUT MONOCRYL 0 CTX 36 (SUTURE) ×4
SUT PLAIN 0 NONE (SUTURE) ×4 IMPLANT
SUT PLAIN 2 0 (SUTURE) ×8
SUT PLAIN 2 0 XLH (SUTURE) IMPLANT
SUT PLAIN ABS 2-0 CT1 27XMFL (SUTURE) IMPLANT
SUT VIC AB 0 CTX 36 (SUTURE) ×4
SUT VIC AB 0 CTX36XBRD ANBCTRL (SUTURE) ×2 IMPLANT
SUT VIC AB 4-0 KS 27 (SUTURE) ×2 IMPLANT
SYR 20CC LL (SYRINGE) ×8 IMPLANT
TOWEL OR 17X24 6PK STRL BLUE (TOWEL DISPOSABLE) ×4 IMPLANT
TRAY FOLEY BAG SILVER LF 14FR (SET/KITS/TRAYS/PACK) ×2 IMPLANT

## 2017-05-20 NOTE — Transfer of Care (Signed)
Immediate Anesthesia Transfer of Care Note  Patient: Theresa [name]  Procedure(s) Performed: Procedure(s): CESAREAN SECTION (N/A) BILATERAL TUBAL LIGATION (Bilateral)  Patient Location: PACU  Anesthesia Type:Spinal  Level of Consciousness: awake and alert   Airway & Oxygen Therapy: Patient Spontanous Breathing  Post-op Assessment: Report given to RN and Post -op Vital signs reviewed and stable  Post vital signs: Reviewed  Last Vitals:  Vitals:   05/20/17 0822 05/20/17 1201  BP: (!) 100/46 107/63  Pulse: 79 (!) 104  Resp:  18  Temp:  36.8 C    Last Pain:  Vitals:   05/20/17 1201  TempSrc: Oral  PainSc:       Patients Stated Pain Goal: 0 (05/20/17 0018)  Complications: No apparent anesthesia complications.

## 2017-05-20 NOTE — Anesthesia Procedure Notes (Signed)
Spinal  Patient location during procedure: OR Staffing Anesthesiologist: Montez Hageman Performed: anesthesiologist  Preanesthetic Checklist Completed: patient identified, site marked, surgical consent, pre-op evaluation, timeout performed, IV checked, risks and benefits discussed and monitors and equipment checked Spinal Block Patient position: sitting Prep: DuraPrep Patient monitoring: heart rate, continuous pulse ox and blood pressure Approach: right paramedian Location: L3-4 Injection technique: single-shot Needle Needle type: Sprotte  Needle gauge: 24 G Needle length: 9 cm Additional Notes Expiration date of kit checked and confirmed.  Extreme hysteria and histrionics exhibited by the patient. Needle phobia. Refusing spinal. Refusing general. Would not comply with any positioning requests. Demanding the monitors be removed. Extensive gentle counseling by nurses and myself, followed by Dr. Elonda Husky. After multiple stalled attempts to start spinal, patient finally agreed to proceed. Skin local resulted in crying and screaming with severe lumbar lordosis. Success with paramedian approach on 1st attempt despite movement back flexion.

## 2017-05-20 NOTE — Anesthesia Preprocedure Evaluation (Signed)
Anesthesia Evaluation  Patient identified by MRN, date of birth, ID band Patient awake    Reviewed: Allergy & Precautions, NPO status , Patient's Chart, lab work & pertinent test results  Airway Mallampati: II  TM Distance: >3 FB Neck ROM: Full    Dental no notable dental hx.    Pulmonary former smoker,    Pulmonary exam normal breath sounds clear to auscultation       Cardiovascular negative cardio ROS Normal cardiovascular exam Rhythm:Regular Rate:Normal     Neuro/Psych negative neurological ROS  negative psych ROS   GI/Hepatic negative GI ROS, Neg liver ROS,   Endo/Other  negative endocrine ROS  Renal/GU negative Renal ROS  negative genitourinary   Musculoskeletal negative musculoskeletal ROS (+)   Abdominal   Peds negative pediatric ROS (+)  Hematology negative hematology ROS (+)   Anesthesia Other Findings   Reproductive/Obstetrics (+) Pregnancy                             Anesthesia Physical Anesthesia Plan  ASA: II and emergent  Anesthesia Plan: Spinal   Post-op Pain Management:    Induction: Intravenous  PONV Risk Score and Plan: Ondansetron  Airway Management Planned: Natural Airway  Additional Equipment:   Intra-op Plan:   Post-operative Plan: Extubation in OR  Informed Consent: I have reviewed the patients History and Physical, chart, labs and discussed the procedure including the risks, benefits and alternatives for the proposed anesthesia with the patient or authorized representative who has indicated his/her understanding and acceptance.   Dental advisory given  Plan Discussed with: CRNA  Anesthesia Plan Comments:         Anesthesia Quick Evaluation

## 2017-05-20 NOTE — Op Note (Signed)
Preoperative diagnosis:  1.  Intrauterine pregnancy at 7360w2d  weeks gestation                                         2.  Complete previa with acute bleeding episode                                         3.  Desires permanent sterilization   Postoperative diagnosis:  Same as above plus previa was a central previa and may have been predominantly posterior but it definitely was also in the anterior wall of the uterus  Procedure:  Primary cesarean section with bilateral tubal ligation modified Pomeroy technique  Surgeon:  Lazaro ArmsLuther H Eure MD  Assistant:    Anesthesia: Spinal  Findings:  I was called to high risk OB at 11:53 AM because of heavy vaginal bleeding which had just started.  The patient had saturated a large pad and there was active blood dripping from the vagina.  This was actually the third significant bleeding episode the patient has had resulting in 2 admissions but yesterday last night had significant bleeding as well. As a result I made the decision to proceed with an emergency cesarean section.    Over a low transverse incision was delivered a viable female with Apgars of 7 and 8 weighing 4 lbs. 0.2 oz. Uterus, tubes and ovaries were all normal.  There were no other significant findings  Description of operation:  Patient was taken to the operating room and placed in the sitting position where she underwent a spinal anesthetic. She was then placed in the supine position with tilt to the left side. When adequate anesthetic level was obtained she was prepped and draped in usual sterile fashion and a Foley catheter was placed. A Pfannenstiel skin incision was made and carried down sharply to the rectus fascia which was scored in the midline extended laterally. The fascia was taken off the muscles both superiorly and without difficulty. The muscles were divided.  The peritoneal cavity was entered.  Bladder blade was placed, no bladder flap was created.  A low transverse hysterotomy  incision was made and delivered a viable female  infant at [id number] with Apgars of 7 and 8 weighing4 lbs 0 oz.  Cord pH was obtained and was 7.36. The uterus was exteriorized. It was closed in 2 layers, the first being a running interlocking layer and the second being an imbricating layer using 0 monocryl on a CTX needle. There was good resulting hemostasis. The uterus tubes and ovaries were all normal.  A modified Pomeroy bowel tubal ligation was performed bilaterally in the usual fashion without difficulty.  Approximately 2 cm segment was removed from both the right and left fallopian tube.  There was good hemostasis of both partial salpingectomy sites.   Peritoneal cavity was irrigated vigorously. The muscles and peritoneum were reapproximated loosely. The fascia was closed using 0 Vicryl in running fashion. Subcutaneous tissue was made hemostatic and irrigated. The skin was closed using 4-0 Vicryl on a Keith needle in a subcuticular fashion.  Dermabond was placed for additional wound integrity and to serve as a barrier. Blood loss for the procedure was 500 cc. The patient received 2 gram of Ancef prophylactically. The patient was taken to the recovery room in  good stable condition with all counts being correct x3.  EBL 500 cc  EURE,LUTHER H 05/20/2017 2:07 PM

## 2017-05-20 NOTE — H&P (Signed)
`````Attestation of Attending Supervision of Advanced Practitioner: Evaluation and management procedures were performed by the PA/NP/CNM/OB Fellow under my supervision/collaboration. Chart reviewed and agree with management and plan.  The patient has a persistent posterior placenta previa that extends well anterior to the internal os on several u/s, reaching about 9 cm anterior to the internal os. Pt has been vertex on several u/s. Pt does not take iron  , doesn't like it , despite knowledge that she wiill need c.s delivery. We will review hgb, and consider iv iron if needed. Pt will be here at least a week, since this is second hospitalization for bleeding, may need prolonged hospitalization til delivery.  FERGUSON,JOHN V 05/17/2017 6:10 AM       Expand All Collapse All   [] Hide copied text [] Hover for attribution information ANTEPARTUM ADMISSION HISTORY AND PHYSICAL NOTE   History of Present Illness: Theresa [name] is a 26 y.o. [id number] at [redacted]w[redacted]d admitted for vaginal bleeding in the presence of a known posterior placenta previa.  Patient reports the fetal movement as active. Patient reports uterine contraction  activity as none. Patient reports  vaginal bleeding as spotting with some clots; at times heavy and at times dark brown.  Patient describes fluid per vagina as None. Fetal presentation is unsure.      Patient Active Problem List   Diagnosis Date Noted  . Placenta previa antepartum in second trimester 04/12/2017  . Vaginal bleeding 04/11/2017  . [redacted] weeks gestation of pregnancy 04/11/2017  . Placenta previa 04/10/2017  . Supervision of high risk pregnancy in second trimester 02/26/2017  . Family history of genetic disorder 02/26/2017  . Complete placenta previa nos or without hemorrhage, second trimester 02/26/2017  . Depression 05/16/2016  . Cigarette smoker 04/18/2016  . 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 Pica 03/15/2016        Past Medical History:  Diagnosis Date    . Chlamydia   . Depression   . Eczema   . Gonorrhea   . Pyelonephritis    X 2         Past Surgical History:  Procedure Laterality Date  . NO PAST SURGERIES                      OB History  Gravida Para Term Preterm AB Living  5 4 4     4   SAB TAB Ectopic Multiple Live Births        0 4    # Outcome Date GA Lbr Len/2nd Weight Sex Delivery Anes PTL Lv  5 Current           4 Term 05/16/16 [redacted]w[redacted]d 02:17 / 00:11 7 lb 3.7 oz (3.28 kg) M Vag-Spont EPI  LIV     Birth Comments: wnl  3 Term 12/02/14 [redacted]w[redacted]d 05:36 / 00:12 5 lb 5.4 oz (2.421 kg) M Vag-Spont EPI  LIV     Birth CommentsMayford Knife Syndrome  2 Term 02/20/12 [redacted]w[redacted]d 16:37 / 01:09 7 lb 5.6 oz (3.335 kg) M Vag-Spont EPI  LIV  1 Term 09/28/09 [redacted]w[redacted]d  7 lb (3.175 kg) F Vag-Spont EPI N LIV      Social History        Social History  . Marital status: Single    Spouse name: N/A  . Number of children: N/A  . Years of education: N/A         Social History Main Topics  . Smoking status: Former Smoker    Packs/day: 0.50  Types: Cigarettes  . Smokeless tobacco: Never Used  . Alcohol use Yes     Comment: occas. before knowing of pregnancy  . Drug use: No     Comment: last use one week ago  . Sexual activity: No       Other Topics Concern  . None      Social History Narrative  . None         Family History  Problem Relation Age of Onset  . Asthma Daughter   . Williams syndrome Son   . Diabetes Mother   . Birth defects Neg Hx   . Stroke Neg Hx   . Hypertension Neg Hx   . Anesthesia problems Neg Hx   . Hypotension Neg Hx   . Malignant hyperthermia Neg Hx   . Pseudochol deficiency Neg Hx         Allergies  Allergen Reactions  . Latex Itching and Swelling           Prescriptions Prior to Admission  Medication Sig Dispense Refill Last Dose  . Prenatal Vit-Fe Fumarate-FA (PRENATAL VITAMINS) 28-0.8 MG TABS Take 1 tablet by mouth daily. (Patient  not taking: Reported on 04/03/2017) 30 tablet 12 Not Taking    Review of Systems - Negative except vaginal bleeding.   Vitals:  BP 113/61 (BP Location: Right Arm)   Pulse 97   Temp 98.4 F (36.9 C) (Oral)   Resp 18   Ht 5\' 1"  (1.549 m)   Wt 155 lb (70.3 kg)   LMP 10/16/2016 (Within Weeks)   SpO2 100%   BMI 29.29 kg/m  Physical Examination: CONSTITUTIONAL: Well-developed, well-nourished female in no acute distress.  HENT:  Normocephalic, atraumatic, External right and left ear normal. Oropharynx is clear and moist EYES: Conjunctivae and EOM are normal. Pupils are equal, round, and reactive to light. No scleral icterus.  NECK: Normal range of motion, supple, no masses SKIN: Skin is warm and dry. No rash noted. Not diaphoretic. No erythema. No pallor. NEUROLGIC: Alert and oriented to person, place, and time. Normal reflexes, muscle tone coordination. No cranial nerve deficit noted. PSYCHIATRIC: Normal mood and affect. Normal behavior. Normal judgment and thought content. CARDIOVASCULAR: Normal heart rate noted, regular rhythm RESPIRATORY: Effort and breath sounds normal, no problems with respiration noted ABDOMEN: Soft, nontender, nondistended, gravid. MUSCULOSKELETAL: Normal range of motion. No edema and no tenderness. 2+ distal pulses.  Cervix: Not evaluated. and found to be not evaluated/ not evaluated/ and fetal presentation is not evaluated. Membranes:intact Fetal Monitoring:Baseline: 150 bpm with moderate variability.  Tocometer: Flat  Labs:       Results for orders placed or performed during the hospital encounter of 05/16/17 (from the past 24 hour(s))  Urinalysis, Routine w reflex microscopic   Collection Time: 05/16/17 11:45 PM  Result Value Ref Range   Color, Urine YELLOW YELLOW   APPearance HAZY (A) CLEAR   Specific Gravity, Urine 1.003 (L) 1.005 - 1.030   pH 6.0 5.0 - 8.0   Glucose, UA NEGATIVE NEGATIVE mg/dL   Hgb urine dipstick LARGE (A) NEGATIVE    Bilirubin Urine NEGATIVE NEGATIVE   Ketones, ur NEGATIVE NEGATIVE mg/dL   Protein, ur NEGATIVE NEGATIVE mg/dL   Nitrite NEGATIVE NEGATIVE   Leukocytes, UA LARGE (A) NEGATIVE   RBC / HPF 6-30 0 - 5 RBC/hpf   WBC, UA 6-30 0 - 5 WBC/hpf   Bacteria, UA RARE (A) NONE SEEN   Squamous Epithelial / LPF 6-30 (A) NONE SEEN   Mucous  PRESENT     Imaging Studies: ImagingResults  No results found.     Assessment and Plan:     Patient Active Problem List   Diagnosis Date Noted  . Placenta previa antepartum in second trimester 04/12/2017  . Vaginal bleeding 04/11/2017  . [redacted] weeks gestation of pregnancy 04/11/2017  . Placenta previa 04/10/2017  . Supervision of high risk pregnancy in second trimester 02/26/2017  . Family history of genetic disorder 02/26/2017  . Complete placenta previa nos or without hemorrhage, second trimester 02/26/2017  . Depression 05/16/2016  . Cigarette smoker 04/18/2016  . 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 Pica 03/15/2016   Admit to Antenatal Routine antenatal care Patient to have [place] on 05-17-2017.  Patient is s/p BMZ  Marylene Land, CNM   Faculty Practice, [facility]       Electronically signed by Marylene Land, CNM at 05/17/2017 12:46 AM Electronically signed by Tilda Burrow, MD at 05/17/2017 6:14 AM      Admission (Discharged) on 05/16/2017        Revision & Routing History        Detailed Report     This is an interval H&P for this patient who left hospital AMA without being discharged at 1415 She stated she had things to do at home She returns tonight requesting readmission  Her vital signs are stable FHR per nursing routine  Uterine contractions occur every 10 minutes, and are felt by patient There are two quarter sized blood spots on pad  Plan to continue original plan of care Discussed with Dr Alysia Penna Will give LR bolus and infusion   Patient had refused IV but I  explained the medical necessity and she accepts WIll give a dose of Terbutaline due to baseline low BP  And not wanting to give Procardia Will order new Type and Screen  Aviva Signs, CNM  OB Attending  Pt had left AMA on 05/19/17 d/t family matters. She now returns for readmission. Will resume previous care management. Fluids and SQ terb for ut ctx. No Procardia at this time d/t pt's BP.   Nettie Elm, MD OB/GYN Faculty

## 2017-05-20 NOTE — Progress Notes (Signed)
IV removed from Left forearm per pt request. Pt refusing any further IV attempts or labs draws at this time. State she wants to sleep and we can try again in the morning. CNM made aware

## 2017-05-20 NOTE — Progress Notes (Signed)
Pt's bleeding stable, VSS, and able to move all extremities. Will wheel pt to NICU to see baby before transferring to Rm 316. NICU and HROB aware.

## 2017-05-20 NOTE — Progress Notes (Signed)
  Patient ID: Theresa QuartoJamel L [name], female   DOB: 1991/04/16, 26 y.o.   MRN: [id number]   OB Attending  No more bleeding or ut ctx. Continue with present management

## 2017-05-21 ENCOUNTER — Encounter: Payer: Medicaid Other | Admitting: Obstetrics and Gynecology

## 2017-05-21 ENCOUNTER — Encounter (HOSPITAL_COMMUNITY): Payer: Self-pay | Admitting: *Deleted

## 2017-05-21 LAB — CBC
HCT: 29 % — ABNORMAL LOW (ref 36.0–46.0)
Hemoglobin: 9.9 g/dL — ABNORMAL LOW (ref 12.0–15.0)
MCH: 29.1 pg (ref 26.0–34.0)
MCHC: 34.1 g/dL (ref 30.0–36.0)
MCV: 85.3 fL (ref 78.0–100.0)
PLATELETS: 229 10*3/uL (ref 150–400)
RBC: 3.4 MIL/uL — AB (ref 3.87–5.11)
RDW: 13.6 % (ref 11.5–15.5)
WBC: 11.9 10*3/uL — AB (ref 4.0–10.5)

## 2017-05-21 NOTE — Lactation Note (Signed)
This note was copied from a baby's chart. Lactation Consultation Note  Patient Name: Theresa [name] NWGNF'AToday's Date: 05/21/2017 Reason for consult: Initial assessment;NICU baby  NICU baby 6821 hours old. Mom reports that she has pumped once this morning, and intends to pump again soon. Discussed progression of milk coming to volume and supply and demand. Enc mom to pump every 2-3 hours for a total of 8-12 times/24 hours followed by hand expression. Mom reports that she is able to hand express. [facility] [place] [facility]. Mom aware of OP/BFSG and LC phone line assistance after D/C.   Maternal Data Has patient been taught Hand Expression?: Yes Does the patient have breastfeeding experience prior to this delivery?: Yes  Feeding    LATCH Score/Interventions                      Lactation Tools Discussed/Used Tools: Pump Breast pump type: Double-Electric Breast Pump Pump Review: Setup, frequency, and cleaning;Milk Storage Initiated by:: bedside RN. Date initiated:: 05/20/17   Consult Status Consult Status: Follow-up Date: 05/22/17 Follow-up type: In-patient    Sherlyn HayJennifer D Nymir Ringler 05/21/2017, 10:43 AM

## 2017-05-21 NOTE — Anesthesia Postprocedure Evaluation (Signed)
Anesthesia Post Note  Patient: Theresa [name]  Procedure(s) Performed: Procedure(s) (LRB): CESAREAN SECTION (N/A) BILATERAL TUBAL LIGATION (Bilateral)     Patient location during evaluation: Mother Baby Anesthesia Type: Spinal Level of consciousness: awake and alert and oriented Pain management: satisfactory to patient Vital Signs Assessment: post-procedure vital signs reviewed and stable Respiratory status: spontaneous breathing and nonlabored ventilation Cardiovascular status: stable Postop Assessment: no headache, no backache, patient able to bend at knees, no signs of nausea or vomiting and adequate PO intake Anesthetic complications: no    Last Vitals:  Vitals:   05/20/17 1955 05/21/17 0453  BP: (!) 98/54 95/60  Pulse: 64 61  Resp: 17   Temp: 36.5 C 36.6 C    Last Pain:  Vitals:   05/21/17 0453  TempSrc: Oral  PainSc: 0-No pain   Pain Goal: Patients Stated Pain Goal: 3 (05/21/17 0032)               Madison HickmanGREGORY,Wilho Sharpley

## 2017-05-21 NOTE — Progress Notes (Signed)
Subjective: Postpartum Day 1: Cesarean Delivery, emergent for bleeding complete previa Patient reports incisional pain and tolerating PO.    Objective: Vital signs in last 24 hours: Temp:  [97.4 F (36.3 C)-98.2 F (36.8 C)] 97.8 F (36.6 C) (07/16 0453) Pulse Rate:  [59-104] 61 (07/16 0453) Resp:  [8-20] 17 (07/15 1955) BP: (95-111)/(43-73) 95/60 (07/16 0453) SpO2:  [99 %-100 %] 100 % (07/16 0453)  Physical Exam:  General: alert, cooperative and no distress Lochia: appropriate Uterine Fundus: firm Incision: dressing is dry DVT Evaluation: No evidence of DVT seen on physical exam.   Recent Labs  05/20/17 1130 05/21/17 0650  HGB 9.5* 9.9*  HCT 28.2* 29.0*    Assessment/Plan: Status post Cesarean section. Doing well postoperatively.  Continue current care Probable discharge 05/23/2017.  Truett Mcfarlan H 05/21/2017, 8:06 AM

## 2017-05-22 ENCOUNTER — Encounter (HOSPITAL_COMMUNITY): Payer: Self-pay

## 2017-05-22 MED ORDER — PRENATAL MULTIVITAMIN CH: 1.0000 | ORAL_TABLET | Freq: Every day | ORAL | 1 refills | Status: DC

## 2017-05-22 MED ORDER — OXYCODONE-ACETAMINOPHEN 5-325 MG PO TABS: 1.0000 | ORAL_TABLET | Freq: Four times a day (QID) | ORAL | 0 refills | Status: DC | PRN

## 2017-05-22 MED ORDER — IBUPROFEN 600 MG PO TABS: 600.0000 mg | ORAL_TABLET | Freq: Four times a day (QID) | ORAL | 0 refills | Status: DC

## 2017-05-22 MED ORDER — BISACODYL 10 MG RE SUPP
10.0000 mg | Freq: Once | RECTAL | Status: AC
  Administered 2017-05-22: 10 mg via RECTAL
  Filled 2017-05-22: qty 1

## 2017-05-22 NOTE — Discharge Summary (Signed)
OB Discharge Summary     Patient Name: Theresa [name] DOB: 1991/08/05 MRN: [id number]  Date of admission: 05/19/2017 Delivering MD:     Date of discharge: 05/22/2017  Admitting diagnosis: 33WKS BLEEDING  Intrauterine pregnancy: [redacted]w[redacted]d     Secondary diagnosis:  Active Problems:   Placenta previa  Additional problems: preterm delivery     Discharge diagnosis: Preterm Pregnancy Delivered                                                                                                Post partum procedures:none  Augmentation: n/a  Complications: none  Hospital course:  Onset of Labor With Unplanned C/S  26 y.o. yo [id number] at [redacted]w[redacted]d was admitted with placenta previa and vaginal bleeding on 05/19/2017. Patient had a labor course significant for vaginal bleeding. Membrane Rupture Time/Date:   ,    The patient went for cesarean section due to Previa, and delivered a Viable infant,05/20/2017  Details of operation can be found in separate operative note. Patient had an uncomplicated postpartum course.  She is ambulating,tolerating a regular diet, passing flatus, and urinating well.  Patient is discharged home in stable condition 05/22/17.  Physical exam  Vitals:   05/21/17 2340 05/22/17 0044 05/22/17 0417 05/22/17 0603  BP: (!) 102/49  (!) 101/54   Pulse: 68  63   Resp: 18 18 17 18   Temp: 98.5 F (36.9 C)  98.1 F (36.7 C)   TempSrc: Oral  Axillary   SpO2: 100%  100%   Weight:      Height:       General: alert Lochia: appropriate Uterine Fundus: firm Incision: Healing well with no significant drainage DVT Evaluation: No evidence of DVT seen on physical exam. Labs: Lab Results  Component Value Date   WBC 11.9 (H) 05/21/2017   HGB 9.9 (L) 05/21/2017   HCT 29.0 (L) 05/21/2017   MCV 85.3 05/21/2017   PLT 229 05/21/2017   CMP Latest Ref Rng & Units 11/02/2016  Glucose 65 - 99 mg/dL 90  BUN 6 - 20 mg/dL 9  Creatinine 1.47 - 8.29 mg/dL 5.62  Sodium 130 - 865 mmol/L 135   Potassium 3.5 - 5.1 mmol/L 3.8  Chloride 101 - 111 mmol/L 104  CO2 22 - 32 mmol/L 22  Calcium 8.9 - 10.3 mg/dL 7.8(I)  Total Protein 6.5 - 8.1 g/dL 7.7  Total Bilirubin 0.3 - 1.2 mg/dL 0.5  Alkaline Phos 38 - 126 U/L 73  AST 15 - 41 U/L 25  ALT 14 - 54 U/L 19    Discharge instruction: per After Visit Summary and "Baby and Me Booklet".  After visit meds:  Allergies as of 05/22/2017      Reactions   Latex Itching, Swelling, Other (See Comments)   Reaction:  Localized swelling      Medication List    TAKE these medications   ibuprofen 600 MG tablet Commonly known as:  ADVIL,MOTRIN Take 1 tablet (600 mg total) by mouth every 6 (six) hours.   oxyCODONE-acetaminophen 5-325 MG tablet Commonly known as:  PERCOCET/ROXICET Take 1-2 tablets by  mouth every 6 (six) hours as needed (pain scale 4-7).   prenatal multivitamin Tabs tablet Take 1 tablet by mouth daily at 12 noon.       Diet: routine diet  Activity: Advance as tolerated. Pelvic rest for 6 weeks.   Outpatient follow up:1 month Follow up Appt:Future Appointments Date Time Provider Department Center  07/02/2017 10:20 AM Marny LowensteinWenzel, Julie N, PA-C [facility]   Follow up Visit:No Follow-up on file.  Postpartum contraception: Tubal Ligation  Newborn Data: Live born female  Birth Weight: 4 lb 0.2 oz (1820 g) APGAR: 7, 8  Baby Feeding: Breast Disposition:NICU   05/22/2017 Scheryl DarterJames Tristin Gladman, MD

## 2017-05-22 NOTE — Discharge Instructions (Signed)

## 2017-05-22 NOTE — Lactation Note (Signed)
This note was copied from a baby's chart. Lactation Consultation Note  Patient Name: Theresa [name] EAVWU'JToday's Date: 05/22/2017   Baby 46 hours old.  Mother states she pumped yesterday with no drops expressed. Encouraged her to hand express before pumping and often and use hands on pumping. Recommend pumping q 3 hours. Reviewed engorgement care. [facility] mother message about breast pump.  Carley Hammedva gave her phone number to give to mother to call regarding pumping. Passed on information to mother and suggest she call WIC back.      Maternal Data    Feeding    LATCH Score/Interventions                      Lactation Tools Discussed/Used     Consult Status      Hardie PulleyBerkelhammer, Pinchus Weckwerth Boschen 05/22/2017, 11:25 AM

## 2017-05-23 NOTE — Clinical Social Work Maternal (Signed)
CLINICAL SOCIAL WORK MATERNAL/CHILD NOTE  Patient Details  Name: Theresa [name] MRN: [id number] Date of Birth: 1991-08-01  Date:  05/22/2017  Clinical Social Worker Initiating Note:  Terri Piedra, LCSW Date/ Time Initiated:  05/22/17/1400     Child's Name:  Theresa [name]   Legal Guardian:  Mother   Need for Interpreter:  None   Date of Referral:  05/22/17     Reason for Referral:  Current Substance Use/Substance Use During Pregnancy  (Marijuana use)   Referral Source:  NICU   Address:     Phone number:      Household Members:  Minor Children ([name] has four other children at home, ages 27, 51, 2 and 1.)   Natural Supports (not living in the home):  Friends, Immediate Family, Extended Family ([name] reports that she has a great support system.  She states she has a close relationship with the father of her 1 year old, who helps her with all of her children.  )   Professional Supports: None   Employment:     Type of Work:     Education:      Pensions consultant:  [employer]   Other Resources:      Cultural/Religious Considerations Which May Impact Care: None stated.  Strengths:  Ability to meet basic needs , Pediatrician chosen  ([name] states she has received a co-sleeper from her "counselor from the hospital who has been working with me since my last baby."  She requests assistance with baby basics.)   Risk Factors/Current Problems:  Substance Use , Mental Health Concerns  (marijuana use and hx of depression)   Cognitive State:  Able to Concentrate , Alert , Linear Thinking , Insightful    Mood/Affect:  Interested , Calm , Comfortable , Euthymic    CSW Assessment: CSW met with [name] in her third floor room/316 to offer support, introduce services, and complete assessment due to marijuana use and hx of depression.  [name] was pleasant, welcoming and easy to engage.  She reports that this is her fifth baby, but first experience with an infant in the NICU.   [name] states  she has been hospitalized multiple times due to placenta previa and had prepared herself for a premature delivery/NICU admission.  She acknowledges the sadness of having her baby separated from her at this time, but states understanding, adding, "whatever it takes to make her healthy."  CSW encouraged her to write this statement down and post it somewhere she will see it daily or carry it with her as a reminder of what's important.  CSW validated feelings of sadness due to the current separation and encouraged her to think about the situation as being necessary and temporary.  CSW encouraged her to focus on her baby rather than her surroundings or her discharge date.  [name] was very receptive and agreeable.  She states she is feeling very comfortable with the NICU staff and that "everything happens for a reason."   [name] states she was not prepared for a baby because she got pregnant while on Depo-provera.  She states she has a baby bed (from someone who sounds to be an OBCM, possibly, but [name] does not know the name or agency), but could use assistance with other supplies (CSW notified [facility] [employer] [facility] of need).  [name] was grateful for offer for support.  [name] admits to smoking marijuana prior to knowing that she was pregnant.  She states no marijuana use since finding out approximately 2 months  ago.  CSW informed her of hospital drug screen policy and baby's negative UDS.  [name] stated understanding.  CSW will monitor CDS result and make report accordingly.   [name] describes herself as a "cry baby."  She states she is a very "emotional" person, but denies current depression.  She states she experienced depression in the past, but feels it was directly related to her situations at those various times.  She states she has had times of housing instability and feels depressed when she does not have her own place for her and her children.  She states she is currently staying with a friend, but that she has keys to her new  home "[place] [address]" and is excited to be moving very soon.  She does not know the address, but agrees to let CSW know once she knows.  For now, she is at [address] address.  [name] reports that she has a great support system, and many people to transport her to and from the hospital to be with baby while she heals from her c-section.  She has her own car once she can drive again.  She states that the father of her two year old is very supportive, but that they are not in a relationship.  She states she does not know who the FOB is because she was "cheating on my boyfriend with him (father of 41 year old)."  She states they plan to pursue paternity at some point and that she is not longer with this boyfriend and that both men have stated plans to be involved if it is his baby.  [name] reports that her 1 year old has Williams Syndrome and goes to therapy 4x per week.  She appears very dedicated to this child in the way she spoke about their relationship and involvement in his care.  She is thankful for the help from his father.   CSW provided education regarding the baby blues period vs. perinatal mood disorders, discussed treatment and gave resources for mental health follow up if concerns arise.  CSW encouraged [name] to contact CSW and or her OB if symptoms are noted at any time.   CSW provided review of Sudden Infant Death Syndrome (SIDS) precautions.    CSW Plan/Description:  Engineer, mining , Psychosocial Support and Ongoing Assessment of Needs, Information/Referral to [place], [place], [place] 05/22/2017, 3:30 PM

## 2017-05-24 ENCOUNTER — Ambulatory Visit (HOSPITAL_COMMUNITY): Payer: Medicaid Other

## 2017-05-24 ENCOUNTER — Encounter (HOSPITAL_COMMUNITY): Payer: Self-pay

## 2017-05-28 ENCOUNTER — Encounter (HOSPITAL_COMMUNITY): Payer: Self-pay | Admitting: *Deleted

## 2017-05-28 NOTE — Discharge Summary (Signed)
Discharge Summary:  Pt was admitted for vaginal bleeding secondary to placenta previa.  Pt left AMA. Risks of AMA reviewed with pt. Pt signed AMA forms and left the hospital.

## 2017-05-31 ENCOUNTER — Telehealth: Payer: Self-pay | Admitting: Medical

## 2017-05-31 NOTE — Telephone Encounter (Signed)
Patient said after she delivered she has been bleeding a lot she is concerned

## 2017-06-05 NOTE — Telephone Encounter (Signed)
Returned patient call, apologized for taking so long to get back with her. Wanted to check on her and make sure she is ok and bleeding no longer an issue. Patient stated that she is fine and having no problems. No questions or concerns at this time.

## 2017-07-02 ENCOUNTER — Ambulatory Visit: Payer: Medicaid Other | Admitting: Medical

## 2017-07-02 ENCOUNTER — Encounter: Payer: Self-pay | Admitting: Medical

## 2017-07-03 ENCOUNTER — Encounter: Payer: Self-pay | Admitting: Certified Nurse Midwife

## 2017-07-03 ENCOUNTER — Ambulatory Visit: Payer: Medicaid Other | Admitting: Certified Nurse Midwife

## 2017-07-03 ENCOUNTER — Telehealth: Payer: Self-pay | Admitting: Certified Nurse Midwife

## 2017-07-03 NOTE — Telephone Encounter (Signed)
Patient missed her appt with [place] today, tried to call patient, no answer so a letter has been sent to the patient.

## 2017-08-02 ENCOUNTER — Ambulatory Visit: Payer: Self-pay | Admitting: Certified Nurse Midwife

## 2017-12-26 ENCOUNTER — Inpatient Hospital Stay (HOSPITAL_COMMUNITY)
Admission: AD | Admit: 2017-12-26 | Discharge: 2017-12-26 | Discharge status: Against Medical Advice | Payer: Medicaid Other | Source: Ambulatory Visit | Attending: Obstetrics & Gynecology | Admitting: Obstetrics & Gynecology

## 2017-12-26 ENCOUNTER — Encounter (HOSPITAL_COMMUNITY): Payer: Self-pay | Admitting: *Deleted

## 2017-12-26 ENCOUNTER — Other Ambulatory Visit: Payer: Self-pay

## 2017-12-26 DIAGNOSIS — N939 Abnormal uterine and vaginal bleeding, unspecified: Secondary | ICD-10-CM | POA: Insufficient documentation

## 2017-12-26 DIAGNOSIS — R102 Pelvic and perineal pain: Secondary | ICD-10-CM | POA: Insufficient documentation

## 2017-12-26 LAB — URINALYSIS, ROUTINE W REFLEX MICROSCOPIC
Bilirubin Urine: NEGATIVE
GLUCOSE, UA: NEGATIVE mg/dL
Ketones, ur: NEGATIVE mg/dL
NITRITE: NEGATIVE
Protein, ur: NEGATIVE mg/dL
SPECIFIC GRAVITY, URINE: 1.01 (ref 1.005–1.030)
pH: 6 (ref 5.0–8.0)

## 2017-12-26 LAB — POCT PREGNANCY, URINE: Preg Test, Ur: NEGATIVE

## 2017-12-26 NOTE — MAU Note (Signed)
Been spotting off and on since period, was also having spotting prior to cycle. Has been cramping and nauseated.  Had tubes tied after delivery 7 months ago. Neg HPT

## 2017-12-26 NOTE — MAU Note (Signed)
Not in lobby @1450 ,[phone],[id number]

## 2018-01-19 ENCOUNTER — Inpatient Hospital Stay (HOSPITAL_COMMUNITY)
Admission: AD | Admit: 2018-01-19 | Discharge: 2018-01-19 | Disposition: A | Discharge status: Home / Self Care | Payer: Medicaid Other | Source: Ambulatory Visit | Attending: Obstetrics & Gynecology | Admitting: Obstetrics & Gynecology

## 2018-01-19 ENCOUNTER — Encounter (HOSPITAL_COMMUNITY): Payer: Self-pay

## 2018-01-19 DIAGNOSIS — F329 Major depressive disorder, single episode, unspecified: Secondary | ICD-10-CM | POA: Diagnosis not present

## 2018-01-19 DIAGNOSIS — F17210 Nicotine dependence, cigarettes, uncomplicated: Secondary | ICD-10-CM | POA: Insufficient documentation

## 2018-01-19 DIAGNOSIS — A599 Trichomoniasis, unspecified: Secondary | ICD-10-CM

## 2018-01-19 DIAGNOSIS — R109 Unspecified abdominal pain: Secondary | ICD-10-CM | POA: Insufficient documentation

## 2018-01-19 DIAGNOSIS — Z825 Family history of asthma and other chronic lower respiratory diseases: Secondary | ICD-10-CM | POA: Insufficient documentation

## 2018-01-19 DIAGNOSIS — Z91040 Latex allergy status: Secondary | ICD-10-CM | POA: Diagnosis not present

## 2018-01-19 DIAGNOSIS — Z833 Family history of diabetes mellitus: Secondary | ICD-10-CM | POA: Insufficient documentation

## 2018-01-19 DIAGNOSIS — N938 Other specified abnormal uterine and vaginal bleeding: Secondary | ICD-10-CM

## 2018-01-19 DIAGNOSIS — A598 Trichomoniasis of other sites: Secondary | ICD-10-CM

## 2018-01-19 DIAGNOSIS — Z9889 Other specified postprocedural states: Secondary | ICD-10-CM | POA: Insufficient documentation

## 2018-01-19 DIAGNOSIS — Z9851 Tubal ligation status: Secondary | ICD-10-CM | POA: Diagnosis not present

## 2018-01-19 LAB — URINALYSIS, ROUTINE W REFLEX MICROSCOPIC
BACTERIA UA: NONE SEEN
Bilirubin Urine: NEGATIVE
Glucose, UA: NEGATIVE mg/dL
Ketones, ur: NEGATIVE mg/dL
NITRITE: NEGATIVE
Protein, ur: NEGATIVE mg/dL
SPECIFIC GRAVITY, URINE: 1.024 (ref 1.005–1.030)
pH: 5 (ref 5.0–8.0)

## 2018-01-19 LAB — WET PREP, GENITAL
Sperm: NONE SEEN
YEAST WET PREP: NONE SEEN

## 2018-01-19 LAB — POCT PREGNANCY, URINE: Preg Test, Ur: NEGATIVE

## 2018-01-19 MED ORDER — NORETHINDRONE ACET-ETHINYL EST 1.5-30 MG-MCG PO TABS: 1.0000 | ORAL_TABLET | Freq: Every day | ORAL | 11 refills | Status: DC

## 2018-01-19 MED ORDER — METRONIDAZOLE 500 MG PO TABS: 500.0000 mg | ORAL_TABLET | Freq: Two times a day (BID) | ORAL | 0 refills | Status: DC

## 2018-01-19 NOTE — MAU Note (Signed)
Pt reports irregular bleeding- states she has some spotting some days and some days the bleeding is heavier. Is not bleeding every day. Occasional abdominal pain, LMP was 3weeks-7748month ago.

## 2018-01-19 NOTE — Discharge Instructions (Signed)

## 2018-01-19 NOTE — MAU Provider Note (Signed)
History   27 yo female in with consistent  spotting off and on all the time . Has been going on for months now. Pt states has had BTL.   CSN: [id number]  Arrival date & time 01/19/18  0951   None     Chief Complaint  Patient presents with  . Abdominal Pain  . Vaginal Bleeding    HPI  Past Medical History:  Diagnosis Date  . Chlamydia   . Depression   . Eczema   . Gonorrhea   . Pyelonephritis    X 2    Past Surgical History:  Procedure Laterality Date  . CESAREAN SECTION N/A 05/20/2017   Procedure: CESAREAN SECTION;  Surgeon: Lazaro Arms, MD;  Location: [facility] BIRTHING SUITES;  Service: Obstetrics;  Laterality: N/A;  . NO PAST SURGERIES    . TUBAL LIGATION Bilateral 05/20/2017   Procedure: BILATERAL TUBAL LIGATION;  Surgeon: Lazaro Arms, MD;  Location: [facility] BIRTHING SUITES;  Service: Obstetrics;  Laterality: Bilateral;    Family History  Problem Relation Age of Onset  . Asthma Daughter   . Williams syndrome Son   . Diabetes Mother   . Birth defects Neg Hx   . Stroke Neg Hx   . Hypertension Neg Hx   . Anesthesia problems Neg Hx   . Hypotension Neg Hx   . Malignant hyperthermia Neg Hx   . Pseudochol deficiency Neg Hx     Social History   Tobacco Use  . Smoking status: Current Every Day Smoker    Packs/day: 0.25    Types: Cigarettes  . Smokeless tobacco: Never Used  . Tobacco comment: 4 cig/day  Substance Use Topics  . Alcohol use: Yes    Comment: occas. before knowing of pregnancy  . Drug use: No    Comment: last use one week ago    OB History    Gravida Para Term Preterm AB Living   5 5 4 1   5    SAB TAB Ectopic Multiple Live Births         0 5      Review of Systems  Constitutional: Negative.   HENT: Negative.   Eyes: Negative.   Respiratory: Negative.   Cardiovascular: Negative.   Gastrointestinal: Negative.   Endocrine: Negative.   Genitourinary: Positive for vaginal bleeding.  Musculoskeletal: Negative.   Skin: Negative.    Allergic/Immunologic: Negative.   Neurological: Negative.   Hematological: Negative.   Psychiatric/Behavioral: Negative.     Allergies  Latex  Home Medications    BP (!) 111/58 (BP Location: Right Arm)   Pulse 84   Temp 97.9 F (36.6 C) (Oral)   Resp 18   Ht 5' (1.524 m)   Wt 138 lb 8 oz (62.8 kg)   BMI 27.05 kg/m   Physical Exam  Constitutional: She is oriented to person, place, and time. She appears well-developed and well-nourished.  HENT:  Head: Normocephalic.  Eyes: Pupils are equal, round, and reactive to light.  Neck: Normal range of motion.  Cardiovascular: Normal rate, regular rhythm, normal heart sounds and intact distal pulses.  Pulmonary/Chest: Effort normal and breath sounds normal.  Abdominal: Soft. Bowel sounds are normal.  Genitourinary: Vagina normal and uterus normal.  Musculoskeletal: Normal range of motion.  Neurological: She is alert and oriented to person, place, and time. She has normal reflexes.  Skin: Skin is warm and dry.  Psychiatric: She has a normal mood and affect. Her behavior is normal. Judgment and thought content  normal.    MAU Course  Procedures (including critical care time)  Labs Reviewed  WET PREP, GENITAL  URINALYSIS, ROUTINE W REFLEX MICROSCOPIC  POCT PREGNANCY, URINE  GC/CHLAMYDIA PROBE AMP ([place]) NOT [facility]   Dx: DUB  No diagnosis found.    MDM  VSS, spotting amt dark vag bleeding. Wet prep Trich. Cultures obtained. lengthy discussion with pt regarding treatment options, risk and benefits. Pt denies any risk to OC's.Pt op to try OC's to control bleeding.

## 2018-01-21 LAB — GC/CHLAMYDIA PROBE AMP (~~LOC~~) NOT AT ARMC
Chlamydia: POSITIVE — AB
Neisseria Gonorrhea: NEGATIVE

## 2018-01-23 ENCOUNTER — Telehealth: Payer: Self-pay | Admitting: Medical

## 2018-01-23 DIAGNOSIS — A749 Chlamydial infection, unspecified: Secondary | ICD-10-CM

## 2018-01-23 MED ORDER — AZITHROMYCIN 250 MG PO TABS: 1000.0000 mg | ORAL_TABLET | Freq: Once | ORAL | 0 refills | Status: AC

## 2018-01-23 NOTE — Telephone Encounter (Addendum)
Nasim L Mahan tested positive for  Chlamydia. Patient was called by RN and allergies and pharmacy confirmed. Rx sent to pharmacy of choice.   Kathlene CoteWenzel, Davione Lenker N, PA-C 01/23/2018 9:37 AM      ----- Message from Kathe BectonLori S Berdik, RN sent at 01/22/2018  1:24 PM EDT ----- This patient tested positive for :  chlamydia  She :"is allergic to latex",  I have informed the patient of her results and confirmed her pharmacy is correct in her chart. Please send Rx.   Thank you,   Kathe BectonBerdik, Lori S, RN   [facility]

## 2018-01-26 ENCOUNTER — Other Ambulatory Visit (HOSPITAL_COMMUNITY): Payer: Self-pay | Admitting: Obstetrics and Gynecology

## 2018-01-26 MED ORDER — AZITHROMYCIN 250 MG PO TABS: 1000.0000 mg | ORAL_TABLET | Freq: Once | ORAL | 0 refills | Status: AC

## 2018-03-27 ENCOUNTER — Encounter (HOSPITAL_COMMUNITY): Payer: Self-pay

## 2018-03-27 ENCOUNTER — Inpatient Hospital Stay (HOSPITAL_COMMUNITY)
Admission: AD | Admit: 2018-03-27 | Discharge: 2018-03-27 | Disposition: A | Discharge status: Home / Self Care | Payer: Medicaid Other | Source: Ambulatory Visit | Attending: Obstetrics and Gynecology | Admitting: Obstetrics and Gynecology

## 2018-03-27 ENCOUNTER — Other Ambulatory Visit: Payer: Self-pay

## 2018-03-27 DIAGNOSIS — Z9851 Tubal ligation status: Secondary | ICD-10-CM | POA: Insufficient documentation

## 2018-03-27 DIAGNOSIS — N760 Acute vaginitis: Secondary | ICD-10-CM

## 2018-03-27 DIAGNOSIS — B9689 Other specified bacterial agents as the cause of diseases classified elsewhere: Secondary | ICD-10-CM | POA: Diagnosis not present

## 2018-03-27 DIAGNOSIS — Z3202 Encounter for pregnancy test, result negative: Secondary | ICD-10-CM | POA: Diagnosis not present

## 2018-03-27 DIAGNOSIS — N730 Acute parametritis and pelvic cellulitis: Secondary | ICD-10-CM

## 2018-03-27 DIAGNOSIS — R52 Pain, unspecified: Secondary | ICD-10-CM

## 2018-03-27 DIAGNOSIS — F17210 Nicotine dependence, cigarettes, uncomplicated: Secondary | ICD-10-CM | POA: Insufficient documentation

## 2018-03-27 DIAGNOSIS — R102 Pelvic and perineal pain: Secondary | ICD-10-CM

## 2018-03-27 DIAGNOSIS — Z7251 High risk heterosexual behavior: Secondary | ICD-10-CM

## 2018-03-27 HISTORY — DX: Anemia, unspecified: D64.9

## 2018-03-27 LAB — CBC WITH DIFFERENTIAL/PLATELET
Basophils Absolute: 0 10*3/uL (ref 0.0–0.1)
Basophils Relative: 0 %
Eosinophils Absolute: 0 10*3/uL (ref 0.0–0.7)
Eosinophils Relative: 1 %
HCT: 40.6 % (ref 36.0–46.0)
Hemoglobin: 13.3 g/dL (ref 12.0–15.0)
Lymphocytes Relative: 27 %
Lymphs Abs: 1.6 10*3/uL (ref 0.7–4.0)
MCH: 28.4 pg (ref 26.0–34.0)
MCHC: 32.8 g/dL (ref 30.0–36.0)
MCV: 86.8 fL (ref 78.0–100.0)
Monocytes Absolute: 0.4 10*3/uL (ref 0.1–1.0)
Monocytes Relative: 7 %
Neutro Abs: 3.9 10*3/uL (ref 1.7–7.7)
Neutrophils Relative %: 65 %
Platelets: 181 10*3/uL (ref 150–400)
RBC: 4.68 MIL/uL (ref 3.87–5.11)
RDW: 13.7 % (ref 11.5–15.5)
WBC: 6 10*3/uL (ref 4.0–10.5)

## 2018-03-27 LAB — URINALYSIS, ROUTINE W REFLEX MICROSCOPIC
BILIRUBIN URINE: NEGATIVE
Glucose, UA: NEGATIVE mg/dL
Hgb urine dipstick: NEGATIVE
Ketones, ur: 80 mg/dL — AB
Leukocytes, UA: NEGATIVE
NITRITE: NEGATIVE
PH: 5 (ref 5.0–8.0)
Protein, ur: NEGATIVE mg/dL
SPECIFIC GRAVITY, URINE: 1.025 (ref 1.005–1.030)

## 2018-03-27 LAB — WET PREP, GENITAL
Sperm: NONE SEEN
Trich, Wet Prep: NONE SEEN
Yeast Wet Prep HPF POC: NONE SEEN

## 2018-03-27 LAB — POCT PREGNANCY, URINE: Preg Test, Ur: NEGATIVE

## 2018-03-27 MED ORDER — IBUPROFEN 600 MG PO TABS
600.0000 mg | ORAL_TABLET | Freq: Once | ORAL | Status: AC
  Administered 2018-03-27: 600 mg via ORAL
  Filled 2018-03-27: qty 1

## 2018-03-27 MED ORDER — AZITHROMYCIN 1 G PO PACK
1.0000 g | PACK | Freq: Once | ORAL | Status: AC
  Administered 2018-03-27: 1 g via ORAL
  Filled 2018-03-27: qty 1

## 2018-03-27 MED ORDER — METRONIDAZOLE 500 MG PO TABS: 500.0000 mg | ORAL_TABLET | Freq: Two times a day (BID) | ORAL | 0 refills | Status: DC

## 2018-03-27 MED ORDER — AZITHROMYCIN 1 G PO PACK: 1.0000 g | PACK | Freq: Once | ORAL | 0 refills | Status: AC

## 2018-03-27 MED ORDER — CEFTRIAXONE SODIUM 250 MG IJ SOLR
250.0000 mg | Freq: Once | INTRAMUSCULAR | Status: AC
  Administered 2018-03-27: 250 mg via INTRAMUSCULAR
  Filled 2018-03-27: qty 250

## 2018-03-27 NOTE — Discharge Instructions (Signed)
Pelvic Inflammatory Disease °Pelvic inflammatory disease (PID) is an infection in some or all of the female organs. PID can be in the uterus, ovaries, fallopian tubes, or the surrounding tissues that are inside the lower belly area (pelvis). PID can lead to lasting problems if it is not treated. To check for this disease, your doctor may: °· Do a physical exam. °· Do blood tests, urine tests, or a pregnancy test. °· Look at your vaginal discharge. °· Do tests to look inside the pelvis. °· Test you for other infections. ° °Follow these instructions at home: °· Take over-the-counter and prescription medicines only as told by your doctor. °· If you were prescribed an antibiotic medicine, take it as told by your doctor. Do not stop taking it even if you start to feel better. °· Do not have sex until treatment is done or as told by your doctor. °· Tell your sex partner if you have PID. Your partner may need to be treated. °· Keep all follow-up visits as told by your doctor. This is important. °· Your doctor may test you for infection again 3 months after you are treated. °Contact a doctor if: °· You have more fluid (discharge) coming from your vagina or fluid that is not normal. °· Your pain does not improve. °· You throw up (vomit). °· You have a fever. °· You cannot take your medicines. °· Your partner has a sexually transmitted disease (STD). °· You have pain when you pee (urinate). °Get help right away if: °· You have more belly (abdominal) or lower belly pain. °· You have chills. °· You are not better after 72 hours. °This information is not intended to replace advice given to you by your health care provider. Make sure you discuss any questions you have with your health care provider. °Document Released: 01/19/2009 Document Revised: 03/30/2016 Document Reviewed: 11/30/2014 °Elsevier Interactive Patient Education © 2018 Elsevier Inc. ° °

## 2018-03-27 NOTE — MAU Note (Signed)
Doesn't really know, her body is sore, having pain in her stomach and her head is hurting.  Did not take anything for the pain.  Started this morning.

## 2018-03-27 NOTE — MAU Provider Note (Signed)
History     CSN: [id number]  Arrival date and time: 03/27/18 1731   First Provider Initiated Contact with Patient 03/27/18 1825      Chief Complaint  Patient presents with  . Headache  . Abdominal Pain  . Generalized Body Aches   HPI   Theresa [name] is a 27 y.o. female with a history of tubal ligation, [id number] non pregnant female here with generalized body aches that started this morning. She was treated for trichomonas and chlamydia 1 month ago. Says she has not had intercourse with that partner again. Says she does not think the infection left.  She also complains of lower abdominal pain that comes and goes. The pain has been coming and going for the last 2 days. Says she has not tried any medication for the pain. She currently rates her pain 8/10. States she has had PID in the past.   OB History    Gravida  5   Para  5   Term  4   Preterm  1   AB      Living  5     SAB      TAB      Ectopic      Multiple  0   Live Births  5           Past Medical History:  Diagnosis Date  . Anemia   . Chlamydia   . Depression   . Eczema   . Gonorrhea   . Pyelonephritis    X 2    Past Surgical History:  Procedure Laterality Date  . CESAREAN SECTION N/A 05/20/2017   Procedure: CESAREAN SECTION;  Surgeon: Lazaro Arms, MD;  Location: [facility] BIRTHING SUITES;  Service: Obstetrics;  Laterality: N/A;  . NO PAST SURGERIES    . TUBAL LIGATION Bilateral 05/20/2017   Procedure: BILATERAL TUBAL LIGATION;  Surgeon: Lazaro Arms, MD;  Location: [facility] BIRTHING SUITES;  Service: Obstetrics;  Laterality: Bilateral;    Family History  Problem Relation Age of Onset  . Asthma Daughter   . Williams syndrome Son   . Diabetes Mother   . Birth defects Neg Hx   . Stroke Neg Hx   . Hypertension Neg Hx   . Anesthesia problems Neg Hx   . Hypotension Neg Hx   . Malignant hyperthermia Neg Hx   . Pseudochol deficiency Neg Hx     Social History   Tobacco Use  . Smoking status:  Current Some Day Smoker    Packs/day: 0.25    Types: Cigarettes  . Smokeless tobacco: Never Used  . Tobacco comment: 4 cig/day  Substance Use Topics  . Alcohol use: Not Currently    Comment: occas. before knowing of pregnancy  . Drug use: Not Currently    Types: Marijuana    Comment: last use one week ago    Allergies:  Allergies  Allergen Reactions  . Latex Itching, Swelling and Other (See Comments)    Reaction:  Localized swelling    Medications Prior to Admission  Medication Sig Dispense Refill Last Dose  . ibuprofen (ADVIL,MOTRIN) 600 MG tablet Take 1 tablet (600 mg total) by mouth every 6 (six) hours. 30 tablet 0   . metroNIDAZOLE (FLAGYL) 500 MG tablet Take 1 tablet (500 mg total) by mouth 2 (two) times daily. 14 tablet 0   . Norethindrone Acetate-Ethinyl Estradiol (LOESTRIN 1.5/30, 21,) 1.5-30 MG-MCG tablet Take 1 tablet by mouth daily. 1 Package 11   .  oxyCODONE-acetaminophen (PERCOCET/ROXICET) 5-325 MG tablet Take 1-2 tablets by mouth every 6 (six) hours as needed (pain scale 4-7). 30 tablet 0   . Prenatal Vit-Fe Fumarate-FA (PRENATAL MULTIVITAMIN) TABS tablet Take 1 tablet by mouth daily at 12 noon. 30 tablet 1    Results for orders placed or performed during the hospital encounter of 03/27/18 (from the past 48 hour(s))  Urinalysis, Routine w reflex microscopic     Status: Abnormal   Collection Time: 03/27/18  5:50 PM  Result Value Ref Range   Color, Urine YELLOW YELLOW   APPearance CLOUDY (A) CLEAR   Specific Gravity, Urine 1.025 1.005 - 1.030   pH 5.0 5.0 - 8.0   Glucose, UA NEGATIVE NEGATIVE mg/dL   Hgb urine dipstick NEGATIVE NEGATIVE   Bilirubin Urine NEGATIVE NEGATIVE   Ketones, ur 80 (A) NEGATIVE mg/dL   Protein, ur NEGATIVE NEGATIVE mg/dL   Nitrite NEGATIVE NEGATIVE   Leukocytes, UA NEGATIVE NEGATIVE    Comment: Performed at [facility], [address], [place], [place] [address]  Pregnancy, urine POC     Status: None   Collection Time: 03/27/18   6:10 PM  Result Value Ref Range   Preg Test, Ur NEGATIVE NEGATIVE    Comment:        THE SENSITIVITY OF THIS METHODOLOGY IS >24 mIU/mL   CBC with Differential     Status: None   Collection Time: 03/27/18  6:50 PM  Result Value Ref Range   WBC 6.0 4.0 - 10.5 K/uL   RBC 4.68 3.87 - 5.11 MIL/uL   Hemoglobin 13.3 12.0 - 15.0 g/dL   HCT 60.4 54.0 - 98.1 %   MCV 86.8 78.0 - 100.0 fL   MCH 28.4 26.0 - 34.0 pg   MCHC 32.8 30.0 - 36.0 g/dL   RDW 19.1 47.8 - 29.5 %   Platelets 181 150 - 400 K/uL   Neutrophils Relative % 65 %   Neutro Abs 3.9 1.7 - 7.7 K/uL   Lymphocytes Relative 27 %   Lymphs Abs 1.6 0.7 - 4.0 K/uL   Monocytes Relative 7 %   Monocytes Absolute 0.4 0.1 - 1.0 K/uL   Eosinophils Relative 1 %   Eosinophils Absolute 0.0 0.0 - 0.7 K/uL   Basophils Relative 0 %   Basophils Absolute 0.0 0.0 - 0.1 K/uL    Comment: Performed at [facility], [address] [place], [place] [address]  Wet prep, genital     Status: Abnormal   Collection Time: 03/27/18  7:06 PM  Result Value Ref Range   Yeast Wet Prep HPF POC NONE SEEN NONE SEEN   Trich, Wet Prep NONE SEEN NONE SEEN   Clue Cells Wet Prep HPF POC PRESENT (A) NONE SEEN   WBC, Wet Prep HPF POC MANY (A) NONE SEEN    Comment: MANY BACTERIA SEEN   Sperm NONE SEEN     Comment: Performed at [facility], [address], [place], [place] [address]   Review of Systems  Constitutional: Positive for appetite change and chills.  Gastrointestinal: Positive for abdominal pain. Negative for nausea and vomiting.  Genitourinary: Positive for pelvic pain.   Physical Exam   Blood pressure 106/62, pulse 100, temperature 99.2 F (37.3 C), temperature source Oral, resp. rate 17, weight 137 lb (62.1 kg), last menstrual period 02/19/2018, SpO2 100 %, unknown if currently breastfeeding.  Physical Exam  Constitutional: She is oriented to person, place, and time. She appears well-developed and well-nourished.  Non-toxic appearance. She  does not have a sickly appearance. She does not appear ill. No distress.  Cardiovascular: Normal rate.  Respiratory: Effort normal and breath sounds normal. No respiratory distress. She has no wheezes. She has no rales. She exhibits no tenderness.  GI: There is tenderness in the suprapubic area. There is no rigidity and no guarding.  Genitourinary:  Genitourinary Comments: Vagina - Small amount of white vaginal discharge, + odor  Cervix - No contact bleeding, no active bleeding  Bimanual exam: Cervix closed, + CMT Uterus non tender, normal size Adnexa non tender, no masses bilaterally,+ suprapubic tenderness  GC/Chlam, wet prep done Chaperone present for exam.   Musculoskeletal: Normal range of motion.  Neurological: She is alert and oriented to person, place, and time.  Skin: Skin is warm. She is not diaphoretic.  Psychiatric: Her behavior is normal.   MAU Course  Procedures  None  MDM  Wet prep and GC collected Many WBC with pelvic pain, + CMT, low grade fever and high risk sexual behavior. Rocephin 250 mg given IM, & Azithromycin 1 gram given PO  Assessment and Plan   A:  1. PID (acute pelvic inflammatory disease)   2. Pelvic pain   3. Body aches   4. BV (bacterial vaginosis)   5. High risk sexual behavior, unspecified type     P:  Discharge home in stable condition Rx: Flagyl, Azithromycin 1 gram to repeat in 1 week Return to MAU for emergencies only; if symptoms worsen  Condoms always Follow up with GYN  Darthula Desa, Harolyn Rutherford, NP 03/27/2018 8:12 PM

## 2018-03-27 NOTE — MAU Note (Signed)
Pt reporting recent treatment for Trich, and Chlamydia. Has completed course of abx, but is now reporting vaginal itching and occasional increase in discharge.

## 2018-03-28 LAB — HIV ANTIBODY (ROUTINE TESTING W REFLEX): HIV Screen 4th Generation wRfx: NONREACTIVE

## 2018-03-28 LAB — GC/CHLAMYDIA PROBE AMP (~~LOC~~) NOT AT ARMC
Chlamydia: NEGATIVE
Neisseria Gonorrhea: NEGATIVE

## 2018-08-29 ENCOUNTER — Inpatient Hospital Stay (HOSPITAL_COMMUNITY)
Admission: AD | Admit: 2018-08-29 | Discharge: 2018-08-29 | Disposition: A | Discharge status: Home / Self Care | Payer: Medicaid Other | Source: Ambulatory Visit | Attending: Obstetrics and Gynecology | Admitting: Obstetrics and Gynecology

## 2018-08-29 DIAGNOSIS — F17210 Nicotine dependence, cigarettes, uncomplicated: Secondary | ICD-10-CM | POA: Insufficient documentation

## 2018-08-29 DIAGNOSIS — A5901 Trichomonal vulvovaginitis: Secondary | ICD-10-CM | POA: Diagnosis not present

## 2018-08-29 DIAGNOSIS — N939 Abnormal uterine and vaginal bleeding, unspecified: Secondary | ICD-10-CM | POA: Diagnosis present

## 2018-08-29 LAB — URINALYSIS, ROUTINE W REFLEX MICROSCOPIC
Bilirubin Urine: NEGATIVE
GLUCOSE, UA: NEGATIVE mg/dL
Ketones, ur: NEGATIVE mg/dL
NITRITE: POSITIVE — AB
PH: 5 (ref 5.0–8.0)
Protein, ur: NEGATIVE mg/dL
Specific Gravity, Urine: 1.021 (ref 1.005–1.030)

## 2018-08-29 LAB — POCT PREGNANCY, URINE: PREG TEST UR: NEGATIVE

## 2018-08-29 LAB — WET PREP, GENITAL
CLUE CELLS WET PREP: NONE SEEN
Sperm: NONE SEEN
Yeast Wet Prep HPF POC: NONE SEEN

## 2018-08-29 MED ORDER — METRONIDAZOLE 500 MG PO TABS
2000.0000 mg | ORAL_TABLET | Freq: Once | ORAL | Status: AC
  Administered 2018-08-29: 2000 mg via ORAL
  Filled 2018-08-29: qty 4

## 2018-08-29 NOTE — MAU Note (Signed)
Pt reports LMP 08/09/2018, spotting for the last few days. Wants to make sure she doesn't have an infection or a UTI

## 2018-08-29 NOTE — MAU Note (Signed)
Pt did not wait the 20 minutes after meds were given.

## 2018-08-29 NOTE — MAU Note (Signed)
Not in lobby

## 2018-08-29 NOTE — MAU Provider Note (Signed)
History     CSN: [id number]  Arrival date and time: 08/29/18 1326   First Provider Initiated Contact with Patient 08/29/18 1513      Chief Complaint  Patient presents with  . Vaginal Bleeding   27 y.o here with intermenstrual spotting. Bleeding is red. Seeing the blood when she wipes and in her underwear. No vaginal discharge or odor, or itching. She is sexually active, same partner for 10 mos. Having dysuria x1-2 weeks. No other urinary sx. No fever or back pain.   Past Medical History:  Diagnosis Date  . Anemia   . Chlamydia   . Depression   . Eczema   . Gonorrhea   . Pyelonephritis    X 2    Past Surgical History:  Procedure Laterality Date  . CESAREAN SECTION N/A 05/20/2017   Procedure: CESAREAN SECTION;  Surgeon: Lazaro Arms, MD;  Location: [facility] BIRTHING SUITES;  Service: Obstetrics;  Laterality: N/A;  . NO PAST SURGERIES    . TUBAL LIGATION Bilateral 05/20/2017   Procedure: BILATERAL TUBAL LIGATION;  Surgeon: Lazaro Arms, MD;  Location: [facility] BIRTHING SUITES;  Service: Obstetrics;  Laterality: Bilateral;    Family History  Problem Relation Age of Onset  . Asthma Daughter   . Williams syndrome Son   . Diabetes Mother   . Birth defects Neg Hx   . Stroke Neg Hx   . Hypertension Neg Hx   . Anesthesia problems Neg Hx   . Hypotension Neg Hx   . Malignant hyperthermia Neg Hx   . Pseudochol deficiency Neg Hx     Social History   Tobacco Use  . Smoking status: Current Some Day Smoker    Packs/day: 0.25    Types: Cigarettes  . Smokeless tobacco: Never Used  . Tobacco comment: 4 cig/day  Substance Use Topics  . Alcohol use: Not Currently    Comment: occas. before knowing of pregnancy  . Drug use: Not Currently    Types: Marijuana    Comment: last use one week ago    Allergies:  Allergies  Allergen Reactions  . Latex Itching, Swelling and Other (See Comments)    Reaction:  Localized swelling    Medications Prior to Admission  Medication Sig Dispense  Refill Last Dose  . metroNIDAZOLE (FLAGYL) 500 MG tablet Take 1 tablet (500 mg total) by mouth 2 (two) times daily. 14 tablet 0   . Norethindrone Acetate-Ethinyl Estradiol (LOESTRIN 1.5/30, 21,) 1.5-30 MG-MCG tablet Take 1 tablet by mouth daily. (Patient not taking: Reported on 03/27/2018) 1 Package 11 Not Taking at Unknown time  . Prenatal Vit-Fe Fumarate-FA (PRENATAL MULTIVITAMIN) TABS tablet Take 1 tablet by mouth daily at 12 noon. (Patient not taking: Reported on 03/27/2018) 30 tablet 1 Not Taking at Unknown time    Review of Systems  Constitutional: Negative for fever.  Gastrointestinal: Negative for abdominal pain.  Genitourinary: Positive for dysuria and vaginal bleeding. Negative for frequency, hematuria, urgency and vaginal discharge.  Musculoskeletal: Negative for back pain.   Physical Exam   Blood pressure 93/68, pulse 83, temperature 98.5 F (36.9 C), temperature source Oral, resp. rate 18, height 5' (1.524 m), weight 59.9 kg, last menstrual period 08/09/2018, SpO2 100 %, unknown if currently breastfeeding.  Physical Exam  Nursing note and vitals reviewed. Constitutional: She is oriented to person, place, and time. She appears well-developed and well-nourished. No distress.  HENT:  Head: Normocephalic.  Neck: Normal range of motion.  Cardiovascular: Normal rate.  Respiratory: Effort normal. No  respiratory distress.  Musculoskeletal: Normal range of motion.  Neurological: She is alert and oriented to person, place, and time.  Psychiatric: She has a normal mood and affect.   Results for orders placed or performed during the hospital encounter of 08/29/18 (from the past 24 hour(s))  Urinalysis, Routine w reflex microscopic     Status: Abnormal   Collection Time: 08/29/18  2:33 PM  Result Value Ref Range   Color, Urine YELLOW YELLOW   APPearance CLOUDY (A) CLEAR   Specific Gravity, Urine 1.021 1.005 - 1.030   pH 5.0 5.0 - 8.0   Glucose, UA NEGATIVE NEGATIVE mg/dL   Hgb  urine dipstick MODERATE (A) NEGATIVE   Bilirubin Urine NEGATIVE NEGATIVE   Ketones, ur NEGATIVE NEGATIVE mg/dL   Protein, ur NEGATIVE NEGATIVE mg/dL   Nitrite POSITIVE (A) NEGATIVE   Leukocytes, UA MODERATE (A) NEGATIVE   RBC / HPF 6-10 0 - 5 RBC/hpf   WBC, UA 21-50 0 - 5 WBC/hpf   Bacteria, UA RARE (A) NONE SEEN   Squamous Epithelial / LPF 11-20 0 - 5   Mucus PRESENT    Trichomonas, UA PRESENT (A) NONE SEEN  Wet prep, genital     Status: Abnormal   Collection Time: 08/29/18  2:33 PM  Result Value Ref Range   Yeast Wet Prep HPF POC NONE SEEN NONE SEEN   Trich, Wet Prep PRESENT (A) NONE SEEN   Clue Cells Wet Prep HPF POC NONE SEEN NONE SEEN   WBC, Wet Prep HPF POC FEW (A) NONE SEEN   Sperm NONE SEEN   Pregnancy, urine POC     Status: None   Collection Time: 08/29/18  2:37 PM  Result Value Ref Range   Preg Test, Ur NEGATIVE NEGATIVE   MAU Course  Procedures Flagyl 2gm  MDM Labs ordered and reviewed. Will treat trich. UC sent. Expedited partner treatment provided. Stable for discharge home.  Assessment and Plan   1. Trichomonal vaginitis    Discharge home Follow up with PCP as needed Abstinence x2 weeks Avoid alcohol  Allergies as of 08/29/2018      Reactions   Latex Itching, Swelling, Other (See Comments)   Reaction:  Localized swelling      Medication List    STOP taking these medications   metroNIDAZOLE 500 MG tablet Commonly known as:  FLAGYL   Norethindrone Acetate-Ethinyl Estradiol 1.5-30 MG-MCG tablet Commonly known as:  JUNEL,LOESTRIN,MICROGESTIN   prenatal multivitamin Tabs tablet      Donette Larry, CNM 08/29/2018, 3:32 PM

## 2018-08-29 NOTE — Discharge Instructions (Signed)

## 2018-08-30 LAB — GC/CHLAMYDIA PROBE AMP (~~LOC~~) NOT AT ARMC
Chlamydia: NEGATIVE
Neisseria Gonorrhea: NEGATIVE

## 2018-08-31 LAB — URINE CULTURE

## 2018-09-01 ENCOUNTER — Telehealth: Payer: Self-pay | Admitting: Certified Nurse Midwife

## 2018-09-01 MED ORDER — NITROFURANTOIN MONOHYD MACRO 100 MG PO CAPS: 100.0000 mg | ORAL_CAPSULE | Freq: Two times a day (BID) | ORAL | 0 refills | Status: DC

## 2018-09-01 NOTE — Telephone Encounter (Signed)
Left message with mother to call back  

## 2018-09-01 NOTE — Telephone Encounter (Signed)
+  UTI. Rx sent. Recommend increase water intake and finish all abx. Pt verbalizes understanding.

## 2018-12-29 ENCOUNTER — Inpatient Hospital Stay (HOSPITAL_COMMUNITY)
Admission: AD | Admit: 2018-12-29 | Discharge: 2018-12-29 | Disposition: A | Discharge status: Home / Self Care | Payer: Medicaid Other | Attending: Obstetrics & Gynecology | Admitting: Obstetrics & Gynecology

## 2018-12-29 DIAGNOSIS — Z3202 Encounter for pregnancy test, result negative: Secondary | ICD-10-CM | POA: Insufficient documentation

## 2018-12-29 DIAGNOSIS — Z789 Other specified health status: Secondary | ICD-10-CM

## 2018-12-29 DIAGNOSIS — N898 Other specified noninflammatory disorders of vagina: Secondary | ICD-10-CM | POA: Insufficient documentation

## 2018-12-29 NOTE — MAU Provider Note (Addendum)
First Provider Initiated Contact with Patient 12/29/18 2131     S [name] is a 28 y.o. [phone] non-pregnant female who presents to MAU today with complaint of vaginal discharge.   O BP 111/63 (BP Location: Right Arm)   Pulse 67   Temp 98.6 F (37 C) (Oral)   Resp 16   Ht 5' (1.524 m)   Wt 67.8 kg   LMP 12/23/2018   SpO2 100%   BMI 29.18 kg/m  Physical Exam  Vitals reviewed. Constitutional: She is oriented to person, place, and time. She appears well-developed and well-nourished. No distress.  Cardiovascular: Normal rate, regular rhythm and normal heart sounds.  Respiratory: Effort normal and breath sounds normal. No respiratory distress. She has no wheezes.  GI: Bowel sounds are normal. She exhibits no distension. There is no abdominal tenderness.  Neurological: She is alert and oriented to person, place, and time.  Psychiatric: She has a normal mood and affect. Her behavior is normal. Thought content normal.   UPT in MAU negative   A Non pregnant female Medical screening exam complete 1. Not currently pregnant   2. Vaginal discharge    P Discharge from MAU in stable condition Patient given the option of transfer to [facility] for further evaluation or seek care in outpatient facility of choice List of options for follow-up given  Warning signs for worsening condition that would warrant emergency follow-up discussed Patient may return to MAU as needed for pregnancy related complaints  Sharyon Cable, CNM 12/29/2018 9:34 PM

## 2018-12-29 NOTE — MAU Note (Addendum)
Pt reports vaginal discharge and urine with strong odor x1 week. Pt unsure if pregnant, but had BTL. Reports some mild abdominal and back pain. Thinks she has a UTI. Wants to be tested for STD's. Had trich 2 months ago and wants to be retested.

## 2018-12-30 LAB — POCT PREGNANCY, URINE: Preg Test, Ur: NEGATIVE

## 2019-02-07 ENCOUNTER — Telehealth: Payer: Self-pay | Admitting: Obstetrics and Gynecology

## 2019-02-07 DIAGNOSIS — N390 Urinary tract infection, site not specified: Secondary | ICD-10-CM

## 2019-02-07 NOTE — Telephone Encounter (Signed)
Opened in error

## 2019-02-11 MED ORDER — NITROFURANTOIN MONOHYD MACRO 100 MG PO CAPS: 100.0000 mg | ORAL_CAPSULE | Freq: Two times a day (BID) | ORAL | 0 refills | Status: DC

## 2019-02-11 NOTE — Telephone Encounter (Signed)
Pt reports strong odor with urine and pain with urination.  Pt reports some lower abdominal pain.  Pt reports these are the symptoms she has with each of her UTIs.  Order placed for Macrobid capsule and sent to [facility] on [address]. Pt verbalized understanding to call the office if symptoms persist or worsen.

## 2019-03-19 ENCOUNTER — Telehealth: Payer: Self-pay

## 2019-03-19 NOTE — Telephone Encounter (Signed)
Pt called in stating that she is having UTI symptoms and previously had trichomonas. Pt never got a test of cure and is requesting to see a provider, advised pt we can get her in for a test of cure and to leave a urine sample and we will see how to get her treated. Advised that this will be a nurse visit pt verified understanding and had no questions. Pt will come tomorrow at 940am

## 2019-03-20 ENCOUNTER — Other Ambulatory Visit (HOSPITAL_COMMUNITY)
Admission: RE | Admit: 2019-03-20 | Discharge: 2019-03-20 | Disposition: A | Discharge status: Home / Self Care | Payer: Medicaid Other | Source: Ambulatory Visit | Attending: Obstetrics and Gynecology | Admitting: Obstetrics and Gynecology

## 2019-03-20 ENCOUNTER — Ambulatory Visit (INDEPENDENT_AMBULATORY_CARE_PROVIDER_SITE_OTHER): Payer: Medicaid Other | Admitting: *Deleted

## 2019-03-20 ENCOUNTER — Other Ambulatory Visit: Payer: Self-pay

## 2019-03-20 VITALS — BP 117/68 | HR 67 | Ht 60.0 in | Wt 151.7 lb

## 2019-03-20 DIAGNOSIS — N898 Other specified noninflammatory disorders of vagina: Secondary | ICD-10-CM

## 2019-03-20 DIAGNOSIS — R8290 Unspecified abnormal findings in urine: Secondary | ICD-10-CM

## 2019-03-20 LAB — POCT URINALYSIS DIP (DEVICE)
Bilirubin Urine: NEGATIVE
Glucose, UA: NEGATIVE mg/dL
Hgb urine dipstick: NEGATIVE
Ketones, ur: NEGATIVE mg/dL
Leukocytes,Ua: NEGATIVE
Nitrite: NEGATIVE
Protein, ur: NEGATIVE mg/dL
Specific Gravity, Urine: 1.025 (ref 1.005–1.030)
Urobilinogen, UA: 0.2 mg/dL (ref 0.0–1.0)
pH: 6 (ref 5.0–8.0)

## 2019-03-20 NOTE — Progress Notes (Signed)
Pt presents to clinic for self swab and udip for possible UTI.  Pt reports she has been experiencing an odor, pt denies burning with urination, pelvic pain, urgency, frequency.  Pt states she frequently gets bladder infections.  Pt requesting test of cure for trich.  Pt denies vaginal discharge but reports vaginal odor.  Reviewed with Dr. Jolayne Panther, no urine culture sent out at this time.  Pt instructed in how to perform a self swab.  Pt advised that it takes 24-48 hours to result and she will be notified of abnormal results.  Pt verbalized understanding.  Pt's allergies, pharmacy, and medications verified.

## 2019-03-21 LAB — CERVICOVAGINAL ANCILLARY ONLY
Bacterial vaginitis: POSITIVE — AB
Candida vaginitis: NEGATIVE
Chlamydia: NEGATIVE
Neisseria Gonorrhea: NEGATIVE
Trichomonas: NEGATIVE

## 2019-03-21 NOTE — Progress Notes (Signed)
I have reviewed the chart and agree with nursing staff's documentation of this patient's encounter.  Jamaurie Bernier, MD 03/21/2019 11:50 AM    

## 2019-03-24 ENCOUNTER — Other Ambulatory Visit: Payer: Self-pay

## 2019-03-24 ENCOUNTER — Telehealth: Payer: Self-pay

## 2019-03-24 DIAGNOSIS — B9689 Other specified bacterial agents as the cause of diseases classified elsewhere: Secondary | ICD-10-CM

## 2019-03-24 MED ORDER — METRONIDAZOLE 500 MG PO TABS: 500.0000 mg | ORAL_TABLET | Freq: Two times a day (BID) | ORAL | 0 refills | Status: DC

## 2019-03-24 NOTE — Telephone Encounter (Signed)
Pt called to get her test results from 03/20/2019

## 2019-03-24 NOTE — Addendum Note (Signed)
Addended by: Catalina Antigua on: 03/24/2019 05:35 PM   Modules accepted: Orders

## 2019-03-24 NOTE — Telephone Encounter (Signed)
Spoke with pt about positive Bacterial Vaginosis diagnosis, verified Pharmacy & sent Flagyl. Pt verbalized understanding.

## 2019-08-25 ENCOUNTER — Other Ambulatory Visit: Payer: Self-pay

## 2019-08-25 ENCOUNTER — Inpatient Hospital Stay (HOSPITAL_COMMUNITY)
Admission: AD | Admit: 2019-08-25 | Discharge: 2019-08-25 | Disposition: A | Discharge status: Home / Self Care | Payer: Medicaid Other | Attending: Obstetrics & Gynecology | Admitting: Obstetrics & Gynecology

## 2019-08-25 DIAGNOSIS — Z3202 Encounter for pregnancy test, result negative: Secondary | ICD-10-CM

## 2019-08-25 DIAGNOSIS — N390 Urinary tract infection, site not specified: Secondary | ICD-10-CM | POA: Diagnosis not present

## 2019-08-25 LAB — POCT PREGNANCY, URINE: Preg Test, Ur: NEGATIVE

## 2019-08-25 NOTE — MAU Note (Addendum)
Reports lack of appetite and feels nauseous.  States she often has UTIs and BV.  States she occasionally has some irritation when she voids.  No VB.  LMP 07/26/2019.   States she has some lower pelvic pain for 3 days but it has went away.  Took a HPT and it was negative.

## 2019-08-25 NOTE — MAU Provider Note (Signed)
First Provider Initiated Contact with Patient 08/25/19 2005      S [name] is a 28 y.o. [phone] non-pregnant female who presents to MAU today with complaint of possible UTI.   O BP 120/70   Pulse 85   Temp 99.2 F (37.3 C)   Resp 17   Patient Vitals for the past 24 hrs:  BP Temp Pulse Resp  08/25/19 2000 120/70 99.2 F (37.3 C) 85 17   Physical Exam  Constitutional: She is oriented to person, place, and time. She appears well-developed and well-nourished. No distress.  HENT:  Head: Normocephalic and atraumatic.  Respiratory: Effort normal.  Neurological: She is alert and oriented to person, place, and time.  Skin: She is not diaphoretic.  Psychiatric: She has a normal mood and affect. Her behavior is normal. Judgment and thought content normal.   A Non pregnant female Medical screening exam complete  P Discharge from MAU in stable condition Patient given the option of transfer to [facility] for further evaluation or seek care in outpatient facility of choice List of options for follow-up given  Warning signs for worsening condition that would warrant emergency follow-up discussed Patient may return to MAU as needed for pregnancy related complaints  Nugent, Gerrie Nordmann, NP 08/25/2019 8:12 PM

## 2019-09-01 ENCOUNTER — Other Ambulatory Visit: Payer: Self-pay

## 2019-09-01 ENCOUNTER — Ambulatory Visit: Payer: Medicaid Other

## 2019-09-01 ENCOUNTER — Other Ambulatory Visit (HOSPITAL_COMMUNITY)
Admission: RE | Admit: 2019-09-01 | Discharge: 2019-09-01 | Disposition: A | Discharge status: Home / Self Care | Payer: Medicaid Other | Source: Ambulatory Visit | Attending: Nurse Practitioner | Admitting: Nurse Practitioner

## 2019-09-01 ENCOUNTER — Ambulatory Visit (INDEPENDENT_AMBULATORY_CARE_PROVIDER_SITE_OTHER): Payer: Medicaid Other

## 2019-09-01 DIAGNOSIS — N898 Other specified noninflammatory disorders of vagina: Secondary | ICD-10-CM

## 2019-09-01 DIAGNOSIS — R8290 Unspecified abnormal findings in urine: Secondary | ICD-10-CM | POA: Diagnosis not present

## 2019-09-01 LAB — POCT URINALYSIS DIP (DEVICE)
Bilirubin Urine: NEGATIVE
Glucose, UA: NEGATIVE mg/dL
Hgb urine dipstick: NEGATIVE
Ketones, ur: NEGATIVE mg/dL
Nitrite: POSITIVE — AB
Protein, ur: NEGATIVE mg/dL
Specific Gravity, Urine: 1.025 (ref 1.005–1.030)
Urobilinogen, UA: 0.2 mg/dL (ref 0.0–1.0)
pH: 7 (ref 5.0–8.0)

## 2019-09-01 MED ORDER — NITROFURANTOIN MONOHYD MACRO 100 MG PO CAPS: 100.0000 mg | ORAL_CAPSULE | Freq: Two times a day (BID) | ORAL | 0 refills | Status: DC

## 2019-09-01 NOTE — Addendum Note (Signed)
Addended by: Michel Harrow on: 09/01/2019 06:51 PM   Modules accepted: Orders

## 2019-09-01 NOTE — Progress Notes (Addendum)
Pt here today with c/o vaginal itching and burning with urination.  Pt reports that she went to the [facility] and [place] and a pregnancy test was done because pt was told that they only do OB.  Pt explained how to obtain self swab and that results will be back in 24-48 hrs in which we will call with abnormal results.  UA resulted with positive nitrates.  Pt prescribed Macrobid 100 mg po bid x 5 days per standard protocol.  Pt informed that if we need to treat with a different antibiotic then we will call her.  If what she is taking is effective for tx then she will not get a call back from the office.  Pt verbalized understanding.   Mel Almond, RN 09/01/19  Chart reviewed for nurse visit. Agree with plan of care.   Virginia Rochester, NP 09/01/2019 7:33 PM

## 2019-09-03 LAB — URINE CULTURE

## 2019-09-05 LAB — CERVICOVAGINAL ANCILLARY ONLY
Bacterial Vaginitis (gardnerella): POSITIVE — AB
Candida Glabrata: NEGATIVE
Candida Vaginitis: POSITIVE — AB
Chlamydia: NEGATIVE
Comment: NEGATIVE
Comment: NEGATIVE
Comment: NEGATIVE
Comment: NEGATIVE
Comment: NEGATIVE
Comment: NORMAL
Neisseria Gonorrhea: NEGATIVE
Trichomonas: NEGATIVE

## 2019-09-06 MED ORDER — METRONIDAZOLE 500 MG PO TABS: 500.0000 mg | ORAL_TABLET | Freq: Two times a day (BID) | ORAL | 0 refills | Status: AC

## 2019-09-06 MED ORDER — FLUCONAZOLE 150 MG PO TABS: ORAL_TABLET | ORAL | 0 refills | Status: DC

## 2019-09-06 NOTE — Addendum Note (Signed)
Addended by: Virginia Rochester on: 09/06/2019 03:27 PM   Modules accepted: Orders

## 2019-09-06 NOTE — Progress Notes (Signed)
Theresa [name] 28 y.o.  Currently being treated for UTI and also has BV and yeast.  Medication sent to her pharmacy and MyChart message sent to client.  Earlie Server, RN, MSN, NP-BC Nurse Practitioner, [facility] for [employer] [employer] 09/06/2019 3:27 PM

## 2019-09-08 ENCOUNTER — Encounter: Payer: Self-pay | Admitting: Student

## 2019-09-08 ENCOUNTER — Ambulatory Visit: Payer: Medicaid Other | Admitting: Student

## 2019-09-24 ENCOUNTER — Telehealth: Payer: Self-pay | Admitting: Obstetrics and Gynecology

## 2019-09-24 NOTE — Telephone Encounter (Signed)
Patient is requesting a call back for results.

## 2019-09-24 NOTE — Telephone Encounter (Signed)
I called Ravyn back and I informed her provider had sent MyChart message re: her results. She states she has not read it. I informed her she has BV and yeast and reviewed the meds that were sent to her pharmacy and encouraged her to call pharmacy first because the meds probably were not still ready for pickup. She voice understanding.  I also asked if she finished the macrobid and she reports she did.  Verneice Caspers,RN

## 2019-09-25 ENCOUNTER — Ambulatory Visit: Payer: Medicaid Other | Admitting: Obstetrics and Gynecology

## 2019-09-29 ENCOUNTER — Telehealth: Payer: Self-pay | Admitting: Family Medicine

## 2019-09-29 MED ORDER — FLUCONAZOLE 150 MG PO TABS: ORAL_TABLET | ORAL | 0 refills | Status: DC

## 2019-09-29 MED ORDER — METRONIDAZOLE 500 MG PO TABS: 500.0000 mg | ORAL_TABLET | Freq: Two times a day (BID) | ORAL | 0 refills | Status: DC

## 2019-09-29 NOTE — Telephone Encounter (Signed)
Patient stated she needs a call from the nurse ASAP about her Rx.

## 2019-09-29 NOTE — Telephone Encounter (Signed)
Pt reports that she never received her medication for tx of BV and yeast.  I reordered Rx's and advised pt that she can go there in a couple hours to pick up.  Pt verbalized understanding.   Mel Almond, RN 09/29/19

## 2020-02-26 ENCOUNTER — Ambulatory Visit: Payer: Medicaid Other

## 2020-03-01 ENCOUNTER — Ambulatory Visit: Payer: Medicaid Other

## 2020-05-04 ENCOUNTER — Ambulatory Visit: Payer: Medicaid Other

## 2020-05-11 ENCOUNTER — Ambulatory Visit (INDEPENDENT_AMBULATORY_CARE_PROVIDER_SITE_OTHER): Payer: Medicaid Other | Admitting: *Deleted

## 2020-05-11 ENCOUNTER — Encounter: Payer: Self-pay | Admitting: *Deleted

## 2020-05-11 ENCOUNTER — Other Ambulatory Visit: Payer: Self-pay

## 2020-05-11 VITALS — BP 108/63 | HR 65 | Wt 172.2 lb

## 2020-05-11 DIAGNOSIS — R8290 Unspecified abnormal findings in urine: Secondary | ICD-10-CM

## 2020-05-11 DIAGNOSIS — R300 Dysuria: Secondary | ICD-10-CM

## 2020-05-11 LAB — POCT URINALYSIS DIP (DEVICE)
Bilirubin Urine: NEGATIVE
Glucose, UA: NEGATIVE mg/dL
Ketones, ur: NEGATIVE mg/dL
Nitrite: NEGATIVE
Protein, ur: NEGATIVE mg/dL
Specific Gravity, Urine: >=1.03 (ref 1.005–1.030)
Urobilinogen, UA: 0.2 mg/dL (ref 0.0–1.0)
pH: 5 (ref 5.0–8.0)

## 2020-05-11 MED ORDER — PHENAZOPYRIDINE HCL 200 MG PO TABS: 200.0000 mg | ORAL_TABLET | Freq: Three times a day (TID) | ORAL | 0 refills | Status: DC | PRN

## 2020-05-11 NOTE — Progress Notes (Signed)
Pt reports tingling sensation with urination x2 weeks. CCUA shows small Leukocyte esterase. Culture will be sent.  Pt was offered antibiotic however she prefers to wait for culture results in order to have correct antibiotic. She stated that if antibiotic is needed, she prefers capsules. Pyridium Rx was sent per standing order. Pt was advised that she will be notified of results via MyChart. She voiced understanding of all information given.

## 2020-05-13 LAB — URINE CULTURE

## 2020-05-14 NOTE — Progress Notes (Signed)
Patient was assessed and managed by nursing staff during this encounter. I have reviewed the chart and agree with the documentation and plan. I have also made any necessary editorial changes.  Saxon Barich DNP, CNM  05/14/20  11:19 AM    

## 2020-06-08 ENCOUNTER — Ambulatory Visit: Payer: Medicaid Other | Admitting: Student

## 2020-07-20 ENCOUNTER — Other Ambulatory Visit: Payer: Self-pay

## 2020-07-20 ENCOUNTER — Other Ambulatory Visit (HOSPITAL_COMMUNITY)
Admission: RE | Admit: 2020-07-20 | Discharge: 2020-07-20 | Disposition: A | Discharge status: Home / Self Care | Payer: Medicaid Other | Source: Ambulatory Visit | Attending: Obstetrics and Gynecology | Admitting: Obstetrics and Gynecology

## 2020-07-20 ENCOUNTER — Ambulatory Visit (INDEPENDENT_AMBULATORY_CARE_PROVIDER_SITE_OTHER): Payer: Medicaid Other | Admitting: *Deleted

## 2020-07-20 VITALS — BP 102/62 | HR 67 | Ht 60.0 in | Wt 167.9 lb

## 2020-07-20 DIAGNOSIS — R102 Pelvic and perineal pain: Secondary | ICD-10-CM | POA: Diagnosis not present

## 2020-07-20 DIAGNOSIS — Z202 Contact with and (suspected) exposure to infections with a predominantly sexual mode of transmission: Secondary | ICD-10-CM | POA: Diagnosis present

## 2020-07-20 DIAGNOSIS — N898 Other specified noninflammatory disorders of vagina: Secondary | ICD-10-CM | POA: Diagnosis not present

## 2020-07-20 LAB — POCT URINALYSIS DIP (DEVICE)
Bilirubin Urine: NEGATIVE
Glucose, UA: NEGATIVE mg/dL
Hgb urine dipstick: NEGATIVE
Ketones, ur: NEGATIVE mg/dL
Nitrite: NEGATIVE
Protein, ur: NEGATIVE mg/dL
Specific Gravity, Urine: 1.025 (ref 1.005–1.030)
Urobilinogen, UA: 1 mg/dL (ref 0.0–1.0)
pH: 6 (ref 5.0–8.0)

## 2020-07-20 NOTE — Progress Notes (Signed)
Here for uti / std check. States having more vaginal discharge than usual, clear discharge with mild bad odor. C/o pain in pelvic and states urine is dark ; but is usually clear. States she gets UTI's and BV and wants to check for both. Will do self swab and Clean catch urine.UA negative today.  Advised to check MyChart for results and we will contact her if needs treatment. She voices understanding. Amiliana Foutz,RN

## 2020-07-20 NOTE — Progress Notes (Signed)
I have reviewed this chart and agree with the RN/CMA assessment and management.    K. Meryl Alsace Dowd, M.D. Attending [facility] (Faculty Practice)   

## 2020-07-21 LAB — CERVICOVAGINAL ANCILLARY ONLY
Bacterial Vaginitis (gardnerella): POSITIVE — AB
Candida Glabrata: NEGATIVE
Candida Vaginitis: NEGATIVE
Chlamydia: NEGATIVE
Comment: NEGATIVE
Comment: NEGATIVE
Comment: NEGATIVE
Comment: NEGATIVE
Comment: NEGATIVE
Comment: NORMAL
Neisseria Gonorrhea: NEGATIVE
Trichomonas: POSITIVE — AB

## 2020-07-23 MED ORDER — METRONIDAZOLE 500 MG PO TABS: ORAL_TABLET | ORAL | 0 refills | Status: DC

## 2020-07-23 NOTE — Addendum Note (Signed)
Addended by: Leroy Libman on: 07/23/2020 12:26 PM   Modules accepted: Orders

## 2020-07-26 ENCOUNTER — Telehealth: Payer: Self-pay

## 2020-07-26 NOTE — Telephone Encounter (Signed)
Called Pt to advise of Testing positive for Trichomonas &  BV, No answer, left message for call back on # ending [phone], mailbox not set up for # ending [phone]. Also sent Pt My Chart messenger advising of test results & Rx Flagyl sent to her Pharmacy.

## 2020-07-26 NOTE — Telephone Encounter (Signed)
-----   Message from Conan Bowens, MD sent at 07/23/2020 12:26 PM EDT ----- +BV and Ivery Quale, Rx sent to pharmacy, please call and let patient know

## 2020-10-07 ENCOUNTER — Ambulatory Visit: Payer: Medicaid Other

## 2020-10-19 ENCOUNTER — Ambulatory Visit: Payer: Medicaid Other

## 2021-04-22 ENCOUNTER — Ambulatory Visit: Payer: Medicaid Other

## 2021-09-19 ENCOUNTER — Ambulatory Visit (INDEPENDENT_AMBULATORY_CARE_PROVIDER_SITE_OTHER): Payer: Medicaid Other | Admitting: General Practice

## 2021-09-19 ENCOUNTER — Other Ambulatory Visit (HOSPITAL_COMMUNITY)
Admission: RE | Admit: 2021-09-19 | Discharge: 2021-09-19 | Disposition: A | Discharge status: Home / Self Care | Payer: Medicaid Other | Source: Ambulatory Visit | Attending: Family Medicine | Admitting: Family Medicine

## 2021-09-19 ENCOUNTER — Other Ambulatory Visit: Payer: Self-pay

## 2021-09-19 VITALS — BP 111/51 | HR 89 | Ht 61.0 in | Wt 164.0 lb

## 2021-09-19 DIAGNOSIS — Z113 Encounter for screening for infections with a predominantly sexual mode of transmission: Secondary | ICD-10-CM | POA: Diagnosis present

## 2021-09-19 LAB — POCT URINALYSIS DIP (DEVICE)
Bilirubin Urine: NEGATIVE
Glucose, UA: NEGATIVE mg/dL
Ketones, ur: NEGATIVE mg/dL
Leukocytes,Ua: NEGATIVE
Nitrite: NEGATIVE
Protein, ur: NEGATIVE mg/dL
Specific Gravity, Urine: >=1.03 (ref 1.005–1.030)
Urobilinogen, UA: 0.2 mg/dL (ref 0.0–1.0)
pH: 5.5 (ref 5.0–8.0)

## 2021-09-19 NOTE — Progress Notes (Signed)
Patient presents to office today requesting STD testing & to be checked for possibel UTI. She reports her partner went to the ER last Thursday with stomach cramping & frequency of urination. She states he was tested/treated for STDs and a UTI while there and given "a shot in the butt" and doxycycline to take at home. Patient is unsure of his test results. UA is unremarkable in office today. Patient was instructed in self swab & specimen was collected. Advised results take 24-48 hours to finalize and will be available in mychart. Told patient we will reach out with any abnormal results or treatment needed. Encouraged patient to return to office for annual exam appt since last pap was 2018. Patient verbalized understanding to all.  Chase Caller RN BSN 09/19/21

## 2021-09-20 LAB — CERVICOVAGINAL ANCILLARY ONLY
Chlamydia: NEGATIVE
Comment: NEGATIVE
Comment: NEGATIVE
Comment: NORMAL
Neisseria Gonorrhea: NEGATIVE
Trichomonas: NEGATIVE

## 2021-10-27 ENCOUNTER — Encounter: Payer: Self-pay | Admitting: General Practice

## 2021-11-08 ENCOUNTER — Ambulatory Visit: Payer: Medicaid Other | Admitting: Student

## 2021-11-08 ENCOUNTER — Ambulatory Visit: Payer: Medicaid Other | Admitting: Medical

## 2021-11-12 NOTE — Progress Notes (Signed)
Chart reviewed for nurse visit. Agree with plan of care.   Starr Lake, CNM 11/12/2021 3:21 AM

## 2022-01-19 ENCOUNTER — Ambulatory Visit: Payer: Self-pay

## 2022-01-24 ENCOUNTER — Other Ambulatory Visit (HOSPITAL_COMMUNITY)
Admission: RE | Admit: 2022-01-24 | Discharge: 2022-01-24 | Disposition: A | Discharge status: Home / Self Care | Payer: Medicaid Other | Source: Ambulatory Visit | Attending: Family Medicine | Admitting: Family Medicine

## 2022-01-24 ENCOUNTER — Ambulatory Visit (INDEPENDENT_AMBULATORY_CARE_PROVIDER_SITE_OTHER): Payer: Medicaid Other | Admitting: General Practice

## 2022-01-24 ENCOUNTER — Other Ambulatory Visit: Payer: Self-pay

## 2022-01-24 VITALS — BP 121/68 | HR 88 | Ht 60.0 in | Wt 163.0 lb

## 2022-01-24 DIAGNOSIS — N898 Other specified noninflammatory disorders of vagina: Secondary | ICD-10-CM | POA: Diagnosis present

## 2022-01-24 DIAGNOSIS — R300 Dysuria: Secondary | ICD-10-CM | POA: Diagnosis not present

## 2022-01-24 LAB — POCT URINALYSIS DIP (DEVICE)
Bilirubin Urine: NEGATIVE
Glucose, UA: NEGATIVE mg/dL
Hgb urine dipstick: NEGATIVE
Ketones, ur: NEGATIVE mg/dL
Nitrite: NEGATIVE
Protein, ur: NEGATIVE mg/dL
Specific Gravity, Urine: 1.015 (ref 1.005–1.030)
Urobilinogen, UA: 0.2 mg/dL (ref 0.0–1.0)
pH: 6 (ref 5.0–8.0)

## 2022-01-24 MED ORDER — NITROFURANTOIN MONOHYD MACRO 100 MG PO CAPS: 100.0000 mg | ORAL_CAPSULE | Freq: Two times a day (BID) | ORAL | 0 refills | Status: DC

## 2022-01-24 NOTE — Progress Notes (Addendum)
Chart reviewed for nurse visit. Agree with plan of care.  ? ?Warner Mccreedy, MD ?01/25/2022 7:58 AM  ? ? ?Patient presents to office today reporting strong odor to urine, dysuria, urinary frequency, abdominal cramping, and increased vaginal discharge x 1 week. Patient states this feels similar to when she has had UTIs in the past. She is also requesting self swab to check for STDs as well. UA reveals moderate leukocytes. Macrobid prescribed per protocol & patient informed. Patient was instructed in self swab & specimen was collected. Discussed with patient results should be back within 24-48 hours and available via mychart. Advised we will reach out to you with any abnormal results. Patient verbalized understanding. ? ?Chase Caller RN BSN ?01/24/22 ? ?

## 2022-01-25 LAB — CERVICOVAGINAL ANCILLARY ONLY
Bacterial Vaginitis (gardnerella): POSITIVE — AB
Candida Glabrata: NEGATIVE
Candida Vaginitis: NEGATIVE
Chlamydia: NEGATIVE
Comment: NEGATIVE
Comment: NEGATIVE
Comment: NEGATIVE
Comment: NEGATIVE
Comment: NEGATIVE
Comment: NORMAL
Neisseria Gonorrhea: NEGATIVE
Trichomonas: POSITIVE — AB

## 2022-01-26 ENCOUNTER — Telehealth: Payer: Self-pay | Admitting: Family Medicine

## 2022-01-26 DIAGNOSIS — A599 Trichomoniasis, unspecified: Secondary | ICD-10-CM

## 2022-01-26 MED ORDER — METRONIDAZOLE 500 MG PO TABS: 500.0000 mg | ORAL_TABLET | Freq: Two times a day (BID) | ORAL | 0 refills | Status: DC

## 2022-01-26 NOTE — Telephone Encounter (Signed)
Called patient and informed her of results. Discussed medication sent to pharmacy, medication instructions, abstinence x 10 days, & importance of partner treatment. Patient verbalized understanding to all. ?

## 2022-01-26 NOTE — Telephone Encounter (Signed)
Patient called in regards to self swab results, she was confused about the medication she was prescribed due to the medication being for a yeast infection and her self swab came back positive for BV and Trich.  ?

## 2022-01-27 LAB — URINE CULTURE

## 2022-02-13 ENCOUNTER — Telehealth: Payer: Self-pay | Admitting: Family Medicine

## 2022-02-13 NOTE — Telephone Encounter (Signed)
Need medication for UTI that was confirmed with her test results. ?

## 2022-02-14 NOTE — Telephone Encounter (Signed)
Returned call to patient. Per chart review, results and medication information was given to patient during prior phone call. Message heard when calling mobile number stating "sorry that mailbox is invalid." Called home number and message head saying "call cannot be completed as dialed." MyChart message sent.  ?

## 2022-04-04 NOTE — Progress Notes (Deleted)
  History:  [name] is a 31 y.o. [phone] who presents to clinic today for ***   The following portions of the patient's history were reviewed and updated as appropriate: allergies, current medications, family history, past medical history, social history, past surgical history and problem list.  Review of Systems:  ROS    Objective:  Physical Exam There were no vitals taken for this visit. Physical Exam    Labs and Imaging No results found for this or any previous visit (from the past 24 hour(s)).  No results found.  Health Maintenance Due  Topic Date Due   COVID-19 Vaccine (1) Never done   Hepatitis C Screening  Never done   TETANUS/TDAP  Never done   PAP SMEAR-Modifier  01/05/2020    Labs, imaging and previous visits in Epic and Care Everywhere reviewed  Assessment & Plan:  There are no diagnoses linked to this encounter.   Approximately *** minutes of total time was spent with this patient on ***  Theresa [name], CNM 04/04/2022 4:21 PM

## 2022-04-06 ENCOUNTER — Ambulatory Visit: Payer: Medicaid Other | Admitting: Student

## 2022-04-15 ENCOUNTER — Other Ambulatory Visit: Payer: Self-pay

## 2022-04-15 ENCOUNTER — Encounter (HOSPITAL_COMMUNITY): Payer: Self-pay

## 2022-04-15 ENCOUNTER — Ambulatory Visit (HOSPITAL_COMMUNITY)
Admission: EM | Admit: 2022-04-15 | Discharge: 2022-04-15 | Disposition: A | Discharge status: Other Acute Inpatient Hospital | Payer: Medicaid Other

## 2022-04-15 ENCOUNTER — Emergency Department (HOSPITAL_COMMUNITY)
Admission: EM | Admit: 2022-04-15 | Discharge: 2022-04-15 | Disposition: A | Discharge status: Home / Self Care | Payer: Medicaid Other | Attending: Emergency Medicine | Admitting: Emergency Medicine

## 2022-04-15 DIAGNOSIS — Z5321 Procedure and treatment not carried out due to patient leaving prior to being seen by health care provider: Secondary | ICD-10-CM | POA: Diagnosis not present

## 2022-04-15 DIAGNOSIS — R079 Chest pain, unspecified: Secondary | ICD-10-CM | POA: Insufficient documentation

## 2022-04-15 DIAGNOSIS — R0602 Shortness of breath: Secondary | ICD-10-CM | POA: Diagnosis not present

## 2022-04-15 DIAGNOSIS — R202 Paresthesia of skin: Secondary | ICD-10-CM | POA: Diagnosis present

## 2022-04-15 NOTE — ED Provider Triage Note (Signed)
Emergency Medicine Provider Triage Evaluation Note  [name] , a 31 y.o. female  was evaluated in triage.  Pt complains of feeling overall weak, like she is going to pass out.  Reports symptoms began while she was walking to her mom's yard.  Endorses tingling to bilateral hands, reports she did not wake up like this.  Last menstrual period is unknown, she did have her tubes tied.  She is complaining also of some vague chest pain along with feeling short of breath during this episode.  No prior history of anxiety documented.  Review of Systems  Positive: Weakness,  Negative: Fever, nausea, vomiting  Physical Exam  BP 122/87   Pulse 83   Temp 98.5 F (36.9 C) (Oral)   Resp 14   SpO2 100%  Gen:   Awake, no distress   Resp:  Normal effort  MSK:   Moves extremities without difficulty  Other:    Medical Decision Making  Medically screening exam initiated at 2:23 PM.  Appropriate orders placed.  Cally L Pipkins was informed that the remainder of the evaluation will be completed by another provider, this initial triage assessment does not replace that evaluation, and the importance of remaining in the ED until their evaluation is complete.  Hemodynamically stable.   Claude Manges, PA-C 04/15/22 1430

## 2022-04-15 NOTE — ED Notes (Signed)
Patient did not want to wait in triage and left from triage after screening

## 2022-04-15 NOTE — ED Triage Notes (Signed)
Patient complains of near syncope this am with headache and chest pain. On arrival patient alert and oriented and reports that she has not eaten today. NAD

## 2022-04-18 ENCOUNTER — Other Ambulatory Visit: Payer: Self-pay

## 2022-04-18 ENCOUNTER — Emergency Department (HOSPITAL_COMMUNITY): Payer: Medicaid Other

## 2022-04-18 ENCOUNTER — Encounter (HOSPITAL_COMMUNITY): Payer: Self-pay

## 2022-04-18 ENCOUNTER — Observation Stay (HOSPITAL_COMMUNITY)
Admission: EM | Admit: 2022-04-18 | Discharge: 2022-04-19 | Discharge status: Against Medical Advice | Payer: Medicaid Other | Attending: Emergency Medicine | Admitting: Emergency Medicine

## 2022-04-18 DIAGNOSIS — Z20822 Contact with and (suspected) exposure to covid-19: Secondary | ICD-10-CM | POA: Insufficient documentation

## 2022-04-18 DIAGNOSIS — E669 Obesity, unspecified: Secondary | ICD-10-CM | POA: Insufficient documentation

## 2022-04-18 DIAGNOSIS — Z720 Tobacco use: Secondary | ICD-10-CM | POA: Diagnosis present

## 2022-04-18 DIAGNOSIS — R930 Abnormal findings on diagnostic imaging of skull and head, not elsewhere classified: Secondary | ICD-10-CM | POA: Diagnosis not present

## 2022-04-18 DIAGNOSIS — F17290 Nicotine dependence, other tobacco product, uncomplicated: Secondary | ICD-10-CM | POA: Insufficient documentation

## 2022-04-18 DIAGNOSIS — R778 Other specified abnormalities of plasma proteins: Secondary | ICD-10-CM | POA: Diagnosis not present

## 2022-04-18 DIAGNOSIS — R0602 Shortness of breath: Secondary | ICD-10-CM | POA: Insufficient documentation

## 2022-04-18 DIAGNOSIS — Z79899 Other long term (current) drug therapy: Secondary | ICD-10-CM | POA: Insufficient documentation

## 2022-04-18 DIAGNOSIS — I639 Cerebral infarction, unspecified: Secondary | ICD-10-CM | POA: Insufficient documentation

## 2022-04-18 DIAGNOSIS — Z91040 Latex allergy status: Secondary | ICD-10-CM | POA: Insufficient documentation

## 2022-04-18 DIAGNOSIS — R55 Syncope and collapse: Principal | ICD-10-CM | POA: Insufficient documentation

## 2022-04-18 DIAGNOSIS — R519 Headache, unspecified: Secondary | ICD-10-CM

## 2022-04-18 DIAGNOSIS — Z6839 Body mass index (BMI) 39.0-39.9, adult: Secondary | ICD-10-CM | POA: Diagnosis not present

## 2022-04-18 DIAGNOSIS — F1290 Cannabis use, unspecified, uncomplicated: Secondary | ICD-10-CM | POA: Diagnosis not present

## 2022-04-18 LAB — I-STAT BETA HCG BLOOD, ED (MC, WL, AP ONLY): I-stat hCG, quantitative: <5 m[IU]/mL (ref <5)

## 2022-04-18 LAB — CBC WITH DIFFERENTIAL/PLATELET
Abs Immature Granulocytes: 0.03 10*3/uL (ref 0.00–0.07)
Basophils Absolute: 0 10*3/uL (ref 0.0–0.1)
Basophils Relative: 0 %
Eosinophils Absolute: 0.2 10*3/uL (ref 0.0–0.5)
Eosinophils Relative: 2 %
HCT: 44.2 % (ref 36.0–46.0)
Hemoglobin: 14.7 g/dL (ref 12.0–15.0)
Immature Granulocytes: 0 %
Lymphocytes Relative: 38 %
Lymphs Abs: 5 10*3/uL — ABNORMAL HIGH (ref 0.7–4.0)
MCH: 29.8 pg (ref 26.0–34.0)
MCHC: 33.3 g/dL (ref 30.0–36.0)
MCV: 89.5 fL (ref 80.0–100.0)
Monocytes Absolute: 0.6 10*3/uL (ref 0.1–1.0)
Monocytes Relative: 5 %
Neutro Abs: 7.1 10*3/uL (ref 1.7–7.7)
Neutrophils Relative %: 55 %
Platelets: 223 10*3/uL (ref 150–400)
RBC: 4.94 MIL/uL (ref 3.87–5.11)
RDW: 13.3 % (ref 11.5–15.5)
WBC: 13 10*3/uL — ABNORMAL HIGH (ref 4.0–10.5)
nRBC: 0 % (ref 0.0–0.2)

## 2022-04-18 LAB — COMPREHENSIVE METABOLIC PANEL
ALT: 18 U/L (ref 0–44)
AST: 26 U/L (ref 15–41)
Albumin: 4.4 g/dL (ref 3.5–5.0)
Alkaline Phosphatase: 71 U/L (ref 38–126)
Anion gap: 6 (ref 5–15)
BUN: 8 mg/dL (ref 6–20)
CO2: 23 mmol/L (ref 22–32)
Calcium: 9.2 mg/dL (ref 8.9–10.3)
Chloride: 110 mmol/L (ref 98–111)
Creatinine, Ser: 0.83 mg/dL (ref 0.44–1.00)
GFR, Estimated: >60 mL/min (ref >60)
Glucose, Bld: 86 mg/dL (ref 70–99)
Potassium: 3.9 mmol/L (ref 3.5–5.1)
Sodium: 139 mmol/L (ref 135–145)
Total Bilirubin: 0.5 mg/dL (ref 0.3–1.2)
Total Protein: 8.5 g/dL — ABNORMAL HIGH (ref 6.5–8.1)

## 2022-04-18 LAB — CBG MONITORING, ED: Glucose-Capillary: 88 mg/dL (ref 70–99)

## 2022-04-18 LAB — MAGNESIUM: Magnesium: 2.2 mg/dL (ref 1.7–2.4)

## 2022-04-18 LAB — TROPONIN I (HIGH SENSITIVITY): Troponin I (High Sensitivity): 1421 ng/L (ref <18)

## 2022-04-18 MED ORDER — METOCLOPRAMIDE HCL 5 MG/ML IJ SOLN
10.0000 mg | Freq: Once | INTRAMUSCULAR | Status: AC
  Administered 2022-04-18: 10 mg via INTRAVENOUS
  Filled 2022-04-18: qty 2

## 2022-04-18 MED ORDER — GADOBUTROL 1 MMOL/ML IV SOLN
7.0000 mL | Freq: Once | INTRAVENOUS | Status: AC | PRN
  Administered 2022-04-19: 7 mL via INTRAVENOUS

## 2022-04-18 MED ORDER — LACTATED RINGERS IV BOLUS
1000.0000 mL | Freq: Once | INTRAVENOUS | Status: AC
  Administered 2022-04-18: 1000 mL via INTRAVENOUS

## 2022-04-18 MED ORDER — SODIUM CHLORIDE (PF) 0.9 % IJ SOLN
INTRAMUSCULAR | Status: AC
  Filled 2022-04-18: qty 50

## 2022-04-18 MED ORDER — ASPIRIN 81 MG PO CHEW
324.0000 mg | CHEWABLE_TABLET | Freq: Once | ORAL | Status: AC
Start: 2022-04-18 — End: 2022-04-18
  Administered 2022-04-18: 324 mg via ORAL
  Filled 2022-04-18: qty 4

## 2022-04-18 MED ORDER — DIPHENHYDRAMINE HCL 50 MG/ML IJ SOLN
25.0000 mg | Freq: Once | INTRAMUSCULAR | Status: AC
  Administered 2022-04-18: 25 mg via INTRAVENOUS
  Filled 2022-04-18: qty 1

## 2022-04-18 NOTE — ED Triage Notes (Signed)
Patient reports that she has had a headache from the fron tof her head to the back of her head x 5 days. Patient states that she has intermittent numbness to to the left arm and fingers. Patient also c/o nausea and dizziness.  Patient states she passed out  3 days ago and has had a headache since. Patient was seen on 6/10 23 at [facility] and refused treatment then left AMA.

## 2022-04-18 NOTE — H&P (Incomplete)
Theresa [name] WRU:[id number] DOB: 17-Nov-1990 DOA: 04/18/2022     PCP: Patient, No Pcp Per (Inactive)   Outpatient Specialists: * NONE CARDS: * Dr. NEphrology: *  Dr. NEurology *   Dr. Pulmonary *  Dr.  Oncology * Dr. Sandria Manly* Dr.  Deboraha Sprang, [facility]) No care team member to display Urology Dr. *  Patient arrived to ER on 04/18/22 at 1629 Referred by Attending Pricilla Loveless, MD   Patient coming from:    home Lives alone,   *** With family    Chief Complaint:   Chief Complaint  Patient presents with   Headache   Numbness    HPI: Theresa [name] is a 31 y.o. female with medical history significant of marijuana abuse    Presented with  headache and tingling Reports headaches for the past 5 days as well as intermittent numbness of the left arm and fingers nausea and dizziness 3 days ago she had an episode of passing out and have had a severe headache since.  Patient was seen on 10 June for the same but left AMA  Reports CP on Saturday lowered herself on the ground and passed out  Her left shoulder been hurting    Initial COVID TEST  NEGATIVE**** POSITIVE,  ***in house  PCR testing  Pending  No results found for: "SARSCOV2NAA"   Regarding pertinent Chronic problems: ***  ****Hyperlipidemia - *on statins Lipid Panel  No results found for: "CHOL", "TRIG", "HDL", "CHOLHDL", "VLDL", "LDLCALC", "LDLDIRECT", "LABVLDL"  ***HTN on   ***chronic CHF diastolic/systolic/ combined - last echo***  *** CAD  - On Aspirin, statin, betablocker, Plavix                 - *followed by cardiology                - last cardiac cath  The ASCVD Risk score (Arnett DK, et al., 2019) failed to calculate for the following reasons:   The 2019 ASCVD risk score is only valid for ages 80 to 65    ***DM 2 -  Lab Results  Component Value Date   HGBA1C 5.2 02/26/2017   ****on insulin, PO meds only, diet controlled  ***Hypothyroidism: No results found for: "TSH" on synthroid  *** Morbid obesity-    BMI Readings from Last 1 Encounters:  04/18/22 31.29 kg/m     *** Asthma -well *** controlled on home inhalers/ nebs f                        ***last no prior***admission  ***                       No ***history of intubation  *** COPD - not **followed by pulmonology *** not  on baseline oxygen  *L,    *** OSA -on nocturnal oxygen, *CPAP, *noncompliant with CPAP  *** Hx of CVA - *with/out residual deficits on Aspirin 81 mg, 325, Plavix  ***A. Fib -  - CHA2DS2 vas score **** CHA2DS2/VAS Stroke Risk Points      N/A >= 2 Points: High Risk  1 - 1.99 Points: Medium Risk  0 Points: Low Risk    Last Change: N/A      This score determines the patient's risk of having a stroke if the  patient has atrial fibrillation.      This score is not applicable to this patient. Components are not  calculated.  current  on anticoagulation with ****Coumadin  ***Xarelto,* Eliquis,  *** Not on anticoagulation secondary to Risk of Falls, *** recurrent bleeding         -  Rate control:  Currently controlled with ***Toprolol,  *Metoprolol,* Diltiazem, *Coreg          - Rhythm control: *** amiodarone, *flecainide  ***Hx of DVT/PE on - anticoagulation with ****Coumadin  ***Xarelto,* Eliquis,   ***CKD stage III*- baseline Cr **** Estimated Creatinine Clearance: 86.6 mL/min (by C-G formula based on SCr of 0.83 mg/dL).  Lab Results  Component Value Date   CREATININE 0.83 04/18/2022   CREATININE 0.82 11/02/2016   CREATININE 0.86 03/28/2010     **** Liver disease Computed MELD 3.0 unavailable. Necessary lab results were not found in the last year. Computed MELD-Na unavailable. Necessary lab results were not found in the last year.    ***BPH - on Flomax, Proscar    *** Dementia - on Aricept** Nemenda  *** Chronic anemia - baseline hg Hemoglobin & Hematocrit  Recent Labs    04/18/22 2131  HGB 14.7     While in ER:   Trop     Ordered  CT HEAD IMPRESSION: 1. Multifocal age  indeterminate hypodensities within the left cerebellum, left basal ganglia and potentially left subcortical white matter, question ischemic foci. Recommend further evaluation with MRI. 2. Negative for hemorrhage mass or mass effect.  CXR -   NON acute    CTA chest - ***nonacute, no PE, * no evidence of infiltrate  Following Medications were ordered in ER: Medications  aspirin chewable tablet 324 mg (has no administration in time range)  gadobutrol (GADAVIST) 1 MMOL/ML injection 7 mL (has no administration in time range)  lactated ringers bolus 1,000 mL (1,000 mLs Intravenous New Bag/Given 04/18/22 2203)  metoCLOPramide (REGLAN) injection 10 mg (10 mg Intravenous Given 04/18/22 2204)  diphenhydrAMINE (BENADRYL) injection 25 mg (25 mg Intravenous Given 04/18/22 2205)    _______________________________________________________ ER Provider Called: Neurology    Dr. Otelia Limes They Recommend admit to medicine  requested MRI and C spine w/wo Will see in AM     ED Triage Vitals  Enc Vitals Group     BP 04/18/22 1637 110/69     Pulse Rate 04/18/22 1637 75     Resp 04/18/22 1637 16     Temp 04/18/22 1637 99.2 F (37.3 C)     Temp Source 04/18/22 1637 Oral     SpO2 04/18/22 1637 100 %     Weight 04/18/22 1637 160 lb 3.2 oz (72.7 kg)     Height 04/18/22 1637 5' (1.524 m)     Head Circumference --      Peak Flow --      Pain Score 04/18/22 1718 10     Pain Loc --      Pain Edu? --      Excl. in GC? --   TMAX(24)@     _________________________________________ Significant initial  Findings: Abnormal Labs Reviewed  CBC WITH DIFFERENTIAL/PLATELET - Abnormal; Notable for the following components:      Result Value   WBC 13.0 (*)    Lymphs Abs 5.0 (*)    All other components within normal limits  COMPREHENSIVE METABOLIC PANEL - Abnormal; Notable for the following components:   Total Protein 8.5 (*)    All other components within normal limits  TROPONIN I (HIGH SENSITIVITY) - Abnormal;  Notable for the following components:   Troponin I (High Sensitivity) 1,421 (*)  All other components within normal limits     _________________________ Troponin 1421 ECG: Ordered Personally reviewed by me showing: HR : 66 Rhythm: Sinus rhythm no acute ST/T changes similar to earlier in the day QTC 341    The recent clinical data is shown below. Vitals:   04/18/22 1637 04/18/22 1848 04/18/22 2115 04/18/22 2239  BP: 110/69 106/73 (!) 89/78 123/67  Pulse: 75 78 94 69  Resp: 16 16 16 17   Temp: 99.2 F (37.3 C)  98.8 F (37.1 C)   TempSrc: Oral  Oral   SpO2: 100% 100% 98% 100%  Weight: 72.7 kg     Height: 5' (1.524 m)          WBC     Component Value Date/Time   WBC 13.0 (H) 04/18/2022 2131   LYMPHSABS 5.0 (H) 04/18/2022 2131   LYMPHSABS 3.1 02/26/2017 1534   MONOABS 0.6 04/18/2022 2131   EOSABS 0.2 04/18/2022 2131   EOSABS 0.4 02/26/2017 1534   BASOSABS 0.0 04/18/2022 2131   BASOSABS 0.0 02/26/2017 1534       UA *** no evidence of UTI  ***Pending ***not ordered   Urine analysis:    Component Value Date/Time   COLORURINE YELLOW 08/29/2018 1433   APPEARANCEUR CLOUDY (A) 08/29/2018 1433   LABSPEC 1.015 01/24/2022 1527   PHURINE 6.0 01/24/2022 1527   GLUCOSEU NEGATIVE 01/24/2022 1527   HGBUR NEGATIVE 01/24/2022 1527   BILIRUBINUR NEGATIVE 01/24/2022 1527   KETONESUR NEGATIVE 01/24/2022 1527   PROTEINUR NEGATIVE 01/24/2022 1527   UROBILINOGEN 0.2 01/24/2022 1527   NITRITE NEGATIVE 01/24/2022 1527   LEUKOCYTESUR MODERATE (A) 01/24/2022 1527    Results for orders placed or performed in visit on 01/24/22  Urine Culture     Status: Abnormal   Collection Time: 01/24/22  3:55 PM   Specimen: Urine   UC  Result Value Ref Range Status   Urine Culture, Routine Final report (A)  Final   Organism ID, Bacteria Escherichia coli (A)  Final    Comment: Cefazolin <=4 ug/mL Cefazolin with an MIC <=16 predicts susceptibility to the oral agents cefaclor, cefdinir,  cefpodoxime, cefprozil, cefuroxime, cephalexin, and loracarbef when used for therapy of uncomplicated urinary tract infections due to E. coli, Klebsiella pneumoniae, and Proteus mirabilis. 25,000-50,000 colony forming units per mL    ORGANISM ID, BACTERIA Comment  Final    Comment: Mixed urogenital flora 10,000-25,000 colony forming units per mL    Antimicrobial Susceptibility Comment  Final    Comment:       ** S = Susceptible; I = Intermediate; R = Resistant **                    P = Positive; N = Negative             MICS are expressed in micrograms per mL    Antibiotic                 RSLT#1    RSLT#2    RSLT#3    RSLT#4 Amoxicillin/Clavulanic Acid    S Ampicillin                     S Cefepime                       S Ceftriaxone                    S Cefuroxime  S Ciprofloxacin                  S Ertapenem                      S Gentamicin                     S Imipenem                       S Levofloxacin                   S Meropenem                      S Nitrofurantoin                 S Piperacillin/Tazobactam        S Tetracycline                   S Tobramycin                     S Trimethoprim/Sulfa             S      _______________________________________________ Hospitalist was called for admission for syncope, chest pain, elevated troponin abnormal CT head   The following Work up has been ordered so far:  Orders Placed This Encounter  Procedures   DG Chest 2 View   CT Head Wo Contrast   MR Brain W and Wo Contrast   MR Cervical Spine W or Wo Contrast   CBC with Differential   Comprehensive metabolic panel   Magnesium   Cardiac monitoring   Inpatient consult to Cardiology   Consult to hospitalist   CBG monitoring, ED   I-Stat beta hCG blood, ED   ED EKG   Repeat EKG     OTHER Significant initial  Findings:  labs showing:    Recent Labs  Lab 04/18/22 2131  NA 139  K 3.9  CO2 23  GLUCOSE 86  BUN 8  CREATININE 0.83   CALCIUM 9.2  MG 2.2    Cr  * stable,  Up from baseline see below Lab Results  Component Value Date   CREATININE 0.83 04/18/2022   CREATININE 0.82 11/02/2016   CREATININE 0.86 03/28/2010    Recent Labs  Lab 04/18/22 2131  AST 26  ALT 18  ALKPHOS 71  BILITOT 0.5  PROT 8.5*  ALBUMIN 4.4   Lab Results  Component Value Date   CALCIUM 9.2 04/18/2022          Plt: Lab Results  Component Value Date   PLT 223 04/18/2022       COVID-19 Labs  No results for input(s): "DDIMER", "FERRITIN", "LDH", "CRP" in the last 72 hours.  No results found for: "SARSCOV2NAA"   Arterial ***Venous  Blood Gas result:  pH *** pCO2 ***; pO2 ***;     %O2 Sat ***.  ABG No results found for: "PHART", "PCO2ART", "PO2ART", "HCO3", "TCO2", "ACIDBASEDEF", "O2SAT"       Recent Labs  Lab 04/18/22 2131  WBC 13.0*  NEUTROABS 7.1  HGB 14.7  HCT 44.2  MCV 89.5  PLT 223    HG/HCT * stable,  Down *Up from baseline see below    Component Value Date/Time   HGB 14.7 04/18/2022 2131   HGB 11.1 02/26/2017 1534   HGB 10.8 03/06/2016  0000   HCT 44.2 04/18/2022 2131   HCT 32.5 (L) 02/26/2017 1534   HCT 32 03/06/2016 0000   MCV 89.5 04/18/2022 2131   MCV 84 02/26/2017 1534      No results for input(s): "LIPASE", "AMYLASE" in the last 168 hours. No results for input(s): "AMMONIA" in the last 168 hours.    Cardiac Panel (last 3 results) No results for input(s): "CKTOTAL", "CKMB", "TROPONINI", "RELINDX" in the last 72 hours.  .car BNP (last 3 results) No results for input(s): "BNP" in the last 8760 hours.    DM  labs:  HbA1C: No results for input(s): "HGBA1C" in the last 8760 hours.     CBG (last 3)  Recent Labs    04/18/22 2233  GLUCAP 88          Cultures:    Component Value Date/Time   SDES  08/29/2018 1433    URINE, RANDOM Performed at [facility], [address], [place], [place] [address]    [facility]  08/29/2018 1433    NONE Performed at [facility], [address] [place], [place] [address]    CULT >=100,000 COLONIES/mL ESCHERICHIA COLI (A) 08/29/2018 1433   REPTSTATUS 08/31/2018 FINAL 08/29/2018 1433     Radiological Exams on Admission: DG Chest 2 View  Result Date: 04/18/2022 CLINICAL DATA:  Chest pain syncope EXAM: CHEST - 2 VIEW COMPARISON:  None Available. FINDINGS: The heart size and mediastinal contours are within normal limits. Both lungs are clear. The visualized skeletal structures are unremarkable. IMPRESSION: No active cardiopulmonary disease. Electronically Signed   By: Jasmine Pang M.D.   On: 04/18/2022 22:07   CT Head Wo Contrast  Result Date: 04/18/2022 CLINICAL DATA:  Headache and bilateral hand tingling EXAM: CT HEAD WITHOUT CONTRAST TECHNIQUE: Contiguous axial images were obtained from the base of the skull through the vertex without intravenous contrast. RADIATION DOSE REDUCTION: This exam was performed according to the departmental dose-optimization program which includes automated exposure control, adjustment of the mA and/or kV according to patient size and/or use of iterative reconstruction technique. COMPARISON:  None Available. FINDINGS: Brain: No hemorrhage or intracranial mass. Hypodensity within the left cerebellum. Suspicion of subtle hypodensity within the left basal ganglia and potentially the subcortical white matter of the left frontal lobe. No midline shift. The ventricles are nonenlarged Vascular: No hyperdense vessels.  No unexpected calcification Skull: Normal. Negative for fracture or focal lesion. Sinuses/Orbits: No acute finding. Other: None IMPRESSION: 1. Multifocal age indeterminate hypodensities within the left cerebellum, left basal ganglia and potentially left subcortical white matter, question ischemic foci. Recommend further evaluation with MRI. 2. Negative for hemorrhage mass or mass effect. Electronically Signed   By: Jasmine Pang M.D.   On: 04/18/2022 22:06    _______________________________________________________________________________________________________ Latest  Blood pressure 123/67, pulse 69, temperature 98.8 F (37.1 C), temperature source Oral, resp. rate 17, height 5' (1.524 m), weight 72.7 kg, last menstrual period 03/30/2022, SpO2 100 %, unknown if currently breastfeeding.   Vitals  labs and radiology finding personally reviewed  Review of Systems:    Pertinent positives include: ***  Constitutional:  No weight loss, night sweats, Fevers, chills, fatigue, weight loss  HEENT:  No headaches, Difficulty swallowing,Tooth/dental problems,Sore throat,  No sneezing, itching, ear ache, nasal congestion, post nasal drip,  Cardio-vascular:  No chest pain, Orthopnea, PND, anasarca, dizziness, palpitations.no Bilateral lower extremity swelling  GI:  No heartburn, indigestion, abdominal pain, nausea, vomiting, diarrhea, change in bowel habits, loss of appetite, melena, blood in  stool, hematemesis Resp:  no shortness of breath at rest. No dyspnea on exertion, No excess mucus, no productive cough, No non-productive cough, No coughing up of blood.No change in color of mucus.No wheezing. Skin:  no rash or lesions. No jaundice GU:  no dysuria, change in color of urine, no urgency or frequency. No straining to urinate.  No flank pain.  Musculoskeletal:  No joint pain or no joint swelling. No decreased range of motion. No back pain.  Psych:  No change in mood or affect. No depression or anxiety. No memory loss.  Neuro: no localizing neurological complaints, no tingling, no weakness, no double vision, no gait abnormality, no slurred speech, no confusion  All systems reviewed and apart from HOPI all are negative _______________________________________________________________________________________________ Past Medical History:   Past Medical History:  Diagnosis Date   Anemia    Chlamydia    Depression    Eczema    Gonorrhea     Pyelonephritis    X 2      Past Surgical History:  Procedure Laterality Date   CESAREAN SECTION N/A 05/20/2017   Procedure: CESAREAN SECTION;  Surgeon: Lazaro Arms, MD;  Location: [facility] BIRTHING SUITES;  Service: Obstetrics;  Laterality: N/A;   NO PAST SURGERIES     TUBAL LIGATION Bilateral 05/20/2017   Procedure: BILATERAL TUBAL LIGATION;  Surgeon: Lazaro Arms, MD;  Location: [facility] BIRTHING SUITES;  Service: Obstetrics;  Laterality: Bilateral;    Social History:  Ambulatory *** independently cane, walker  wheelchair bound, bed bound     reports that she has been smoking cigars. She quit smokeless tobacco use about 2 years ago. She reports that she does not currently use alcohol. She reports that she does not currently use drugs after having used the following drugs: Marijuana.     Family History: *** Family History  Problem Relation Age of Onset   Diabetes Mother    Asthma Daughter    Mayford Knife syndrome Son    Birth defects Neg Hx    Stroke Neg Hx    Hypertension Neg Hx    Anesthesia problems Neg Hx    Hypotension Neg Hx    Malignant hyperthermia Neg Hx    Pseudochol deficiency Neg Hx    ______________________________________________________________________________________________ Allergies: Allergies  Allergen Reactions   Latex Itching, Swelling and Other (See Comments)    Reaction:  Localized swelling     Prior to Admission medications   Medication Sig Start Date End Date Taking? Authorizing Provider  metroNIDAZOLE (FLAGYL) 500 MG tablet Take 1 tablet (500 mg total) by mouth 2 (two) times daily. 01/26/22   Warner Mccreedy, MD  nitrofurantoin, macrocrystal-monohydrate, (MACROBID) 100 MG capsule Take 1 capsule (100 mg total) by mouth 2 (two) times daily. 01/24/22   Warner Mccreedy, MD    ___________________________________________________________________________________________________ Physical Exam:    04/18/2022   10:39 PM 04/18/2022    9:15 PM 04/18/2022    6:48 PM   Vitals with BMI  Systolic 123 89 106  Diastolic 67 78 73  Pulse 69 94 78     1. General:  in No ***Acute distress***increased work of breathing ***complaining of severe pain****agitated * Chronically ill *well *cachectic *toxic acutely ill -appearing 2. Psychological: Alert and *** Oriented 3. Head/ENT:   Moist *** Dry Mucous Membranes                          Head Non traumatic, neck supple  Normal *** Poor Dentition 4. SKIN: normal *** decreased Skin turgor,  Skin clean Dry and intact no rash 5. Heart: Regular rate and rhythm no*** Murmur, no Rub or gallop 6. Lungs: ***Clear to auscultation bilaterally, no wheezes or crackles   7. Abdomen: Soft, ***non-tender, Non distended *** obese ***bowel sounds present 8. Lower extremities: no clubbing, cyanosis, no ***edema 9. Neurologically Grossly intact, moving all 4 extremities equally *** strength 5 out of 5 in all 4 extremities cranial nerves II through XII intact 10. MSK: Normal range of motion    Chart has been reviewed  ______________________________________________________________________________________________  Assessment/Plan a 31 y.o. female with medical history significant of marijuana abuse     Admitted for syncope, chest pain, elevated troponin abnormal CT head    Present on Admission: **None**     No problem-specific Assessment & Plan notes found for this encounter.    Other plan as per orders.  DVT prophylaxis:  SCD *** Lovenox       Code Status:    Code Status: Prior FULL CODE *** DNR/DNI ***comfort care as per patient ***family  I had personally discussed CODE STATUS with patient and family* I had spent *min discussing goals of care and CODE STATUS    Family Communication:   Family not at  Bedside  plan of care was discussed on the phone with *** Son, Daughter, Wife, Husband, Sister, Brother , father, mother  Disposition Plan:   *** likely will need placement for  rehabilitation                          Back to current facility when stable                            To home once workup is complete and patient is stable  ***Following barriers for discharge:                            Electrolytes corrected                               Anemia corrected                             Pain controlled with PO medications                               Afebrile, white count improving able to transition to PO antibiotics                             Will need to be able to tolerate PO                            Will likely need home health, home O2, set up                           Will need consultants to evaluate patient prior to discharge  ****EXPECT DC tomorrow                    ***Would benefit from PT/OT eval prior to  DC  Ordered                   Swallow eval - SLP ordered                   Diabetes care coordinator                   Transition of care consulted                   Nutrition    consulted                  Wound care  consulted                   Palliative care    consulted                   Behavioral health  consulted                    Consults called:    neurolog is aware, Cardiology is aware  Admission status:  ED Disposition     None        Obs       Level of care          progressive tele indefinitely please discontinue once patient no longer qualifies COVID-19 Labs     Theresa [name] 04/18/2022, 11:30 PM ***  [facility]     after 2 AM please page floor coverage PA If 7AM-7PM, please contact the day team taking care of the patient using [email or web address]   Patient was evaluated in the context of the global COVID-19 pandemic, which necessitated consideration that the patient might be at risk for infection with the SARS-CoV-2 virus that causes COVID-19. Institutional protocols and algorithms that pertain to the evaluation of patients at risk for COVID-19 are in a state of rapid change based on information  released by regulatory bodies including the CDC and federal and state organizations. These policies and algorithms were followed during the patient's care.

## 2022-04-18 NOTE — Assessment & Plan Note (Signed)
Observe on telemetry continue to cycle cardiac enzymes and trend obtain echogram CTA chest to rule out PE Appreciate cardiology involvement given significantly increased troponin Check CK BnP Order carotid Dopplers

## 2022-04-18 NOTE — ED Notes (Signed)
Pt in MRI.

## 2022-04-18 NOTE — ED Notes (Signed)
Pt called  2x to update vitals and no answer

## 2022-04-18 NOTE — Subjective & Objective (Signed)
Reports headaches for the past 5 days as well as intermittent numbness of the left arm and fingers nausea and dizziness 3 days ago she had an episode of passing out and have had a severe headache since.  Patient was seen on 10 June for the same but left AMA

## 2022-04-18 NOTE — Assessment & Plan Note (Signed)
 -   nursing tobacco cessation protocol

## 2022-04-18 NOTE — ED Provider Notes (Signed)
[facility]-EMERGENCY DEPT Provider Note   CSN: [id number] Arrival date & time: 04/18/22  1629     History  Chief Complaint  Patient presents with   Headache   Numbness    Theresa [name] is a 31 y.o. female.  HPI 31 year old female presents with a chief complaint of headache.  On 6/10 she got out of the car and felt acutely lightheaded and felt like her chest was caving in.  She had bilateral hand/fingertip tingling.  Lowered self to the ground and states she passed out.  Did not injure herself.  When she woke up she was having a left-sided headache.  The headache was pretty severe but it has progressively worsened ever since.  No fevers.  She vomited the next day a couple times.  Chest pain has not recurred though she feels like her left shoulder is sore.  Tingling has remained in her fingers and the headache has progressively worsened.  Feels like a throbbing but also sharp.  Sometimes when concentrating on something she will see a V shape in her vision bilaterally that does not seem to change with closing 1 eye.  Otherwise maybe a little bit of blurry vision.  She is taken ibuprofen with no relief.  No significant history of past headaches.  She is not on birth control.  No focal weakness but she feels diffusely weak and lightheaded still.  Home Medications Prior to Admission medications   Medication Sig Start Date End Date Taking? Authorizing Provider  metroNIDAZOLE (FLAGYL) 500 MG tablet Take 1 tablet (500 mg total) by mouth 2 (two) times daily. 01/26/22   Warner Mccreedyas, Anuka, MD  nitrofurantoin, macrocrystal-monohydrate, (MACROBID) 100 MG capsule Take 1 capsule (100 mg total) by mouth 2 (two) times daily. 01/24/22   Warner Mccreedyas, Anuka, MD      Allergies    Latex    Review of Systems   Review of Systems  Constitutional:  Negative for fever.  Eyes:  Positive for visual disturbance.  Respiratory:  Negative for shortness of breath.   Cardiovascular:  Positive for chest pain.   Gastrointestinal:  Positive for vomiting.  Neurological:  Positive for syncope, light-headedness, numbness (tingling) and headaches.    Physical Exam Updated Vital Signs BP 123/67   Pulse 69   Temp 98.8 F (37.1 C) (Oral)   Resp 17   Ht 5' (1.524 m)   Wt 72.7 kg   LMP 03/30/2022 (Exact Date)   SpO2 100%   BMI 31.29 kg/m  Physical Exam Vitals and nursing note reviewed.  Constitutional:      Appearance: She is well-developed.     Comments: Sitting up, appears comfortable, no acute distress  HENT:     Head: Normocephalic and atraumatic.     Comments: No scalp tenderness Eyes:     Extraocular Movements: Extraocular movements intact.     Pupils: Pupils are equal, round, and reactive to light.     Comments: Normal visual field testing  Cardiovascular:     Rate and Rhythm: Normal rate and regular rhythm.     Heart sounds: Normal heart sounds. No murmur heard. Pulmonary:     Effort: Pulmonary effort is normal.     Breath sounds: Normal breath sounds.  Abdominal:     Palpations: Abdomen is soft.     Tenderness: There is no abdominal tenderness.  Musculoskeletal:     Left shoulder: No deformity or tenderness. Normal range of motion.     Cervical back: Neck  supple. No rigidity.  Skin:    General: Skin is warm and dry.  Neurological:     Mental Status: She is alert.     Comments: CN 3-12 grossly intact. 5/5 strength in all 4 extremities. Grossly normal sensation including fingers. Normal finger to nose.      ED Results / Procedures / Treatments   Labs (all labs ordered are listed, but only abnormal results are displayed) Labs Reviewed  CBC WITH DIFFERENTIAL/PLATELET - Abnormal; Notable for the following components:      Result Value   WBC 13.0 (*)    Lymphs Abs 5.0 (*)    All other components within normal limits  COMPREHENSIVE METABOLIC PANEL - Abnormal; Notable for the following components:   Total Protein 8.5 (*)    All other components within normal limits   TROPONIN I (HIGH SENSITIVITY) - Abnormal; Notable for the following components:   Troponin I (High Sensitivity) 1,421 (*)    All other components within normal limits  MAGNESIUM  CBG MONITORING, ED  I-STAT BETA HCG BLOOD, ED ([facility], [facility], [facility] ONLY)  TROPONIN I (HIGH SENSITIVITY)    EKG EKG Interpretation  Date/Time:  Tuesday April 18 2022 22:00:42 EDT Ventricular Rate:  65 PR Interval:  150 QRS Duration: 99 QT Interval:  388 QTC Calculation: 404 R Axis:   63 Text Interpretation: Sinus rhythm RSR' in V1 or V2, probably normal variant no acute ST/T changes similar to April 15 2022 Confirmed by Pricilla Loveless [phone]) on 04/18/2022 10:11:10 PM  Radiology DG Chest 2 View  Result Date: 04/18/2022 CLINICAL DATA:  Chest pain syncope EXAM: CHEST - 2 VIEW COMPARISON:  None Available. FINDINGS: The heart size and mediastinal contours are within normal limits. Both lungs are clear. The visualized skeletal structures are unremarkable. IMPRESSION: No active cardiopulmonary disease. Electronically Signed   By: Jasmine Pang M.D.   On: 04/18/2022 22:07   CT Head Wo Contrast  Result Date: 04/18/2022 CLINICAL DATA:  Headache and bilateral hand tingling EXAM: CT HEAD WITHOUT CONTRAST TECHNIQUE: Contiguous axial images were obtained from the base of the skull through the vertex without intravenous contrast. RADIATION DOSE REDUCTION: This exam was performed according to the departmental dose-optimization program which includes automated exposure control, adjustment of the mA and/or kV according to patient size and/or use of iterative reconstruction technique. COMPARISON:  None Available. FINDINGS: Brain: No hemorrhage or intracranial mass. Hypodensity within the left cerebellum. Suspicion of subtle hypodensity within the left basal ganglia and potentially the subcortical white matter of the left frontal lobe. No midline shift. The ventricles are nonenlarged Vascular: No hyperdense vessels.  No unexpected  calcification Skull: Normal. Negative for fracture or focal lesion. Sinuses/Orbits: No acute finding. Other: None IMPRESSION: 1. Multifocal age indeterminate hypodensities within the left cerebellum, left basal ganglia and potentially left subcortical white matter, question ischemic foci. Recommend further evaluation with MRI. 2. Negative for hemorrhage mass or mass effect. Electronically Signed   By: Jasmine Pang M.D.   On: 04/18/2022 22:06    Procedures .Critical Care  Performed by: Pricilla Loveless, MD Authorized by: Pricilla Loveless, MD   Critical care provider statement:    Critical care time (minutes):  40   Critical care time was exclusive of:  Separately billable procedures and treating other patients   Critical care was necessary to treat or prevent imminent or life-threatening deterioration of the following conditions:  Cardiac failure and CNS failure or compromise   Critical care was time spent personally by me on  the following activities:  Development of treatment plan with patient or surrogate, discussions with consultants, evaluation of patient's response to treatment, examination of patient, ordering and review of laboratory studies, ordering and review of radiographic studies, ordering and performing treatments and interventions, pulse oximetry, re-evaluation of patient's condition and review of old charts     Medications Ordered in ED Medications  aspirin chewable tablet 324 mg (has no administration in time range)  gadobutrol (GADAVIST) 1 MMOL/ML injection 7 mL (has no administration in time range)  lactated ringers bolus 1,000 mL (1,000 mLs Intravenous New Bag/Given 04/18/22 2203)  metoCLOPramide (REGLAN) injection 10 mg (10 mg Intravenous Given 04/18/22 2204)  diphenhydrAMINE (BENADRYL) injection 25 mg (25 mg Intravenous Given 04/18/22 2205)    ED Course/ Medical Decision Making/ A&P                           Medical Decision Making Amount and/or Complexity of Data  Reviewed External Data Reviewed: notes. Labs: ordered. Radiology: ordered and independent interpretation performed. ECG/medicine tests: ordered and independent interpretation performed.  Risk OTC drugs. Prescription drug management. Decision regarding hospitalization.   Patient has multiple abnormalities on work-up.  She had 1 soft blood pressure but otherwise vitals have been normal.  ECG without acute ischemia on my interpretation as well as with the repeat ECG.  Neuro exam is unremarkable.  However given her headache and syncope of unclear cause (perhaps this was a seizure), a CT head was obtained.  I personally viewed/interpret these images and she has multiple hypodensities.  I discussed with Dr. Otelia Limes of neurology, recommends MRI brain and cervical spine with and without contrast as this is highly concerning for MS.  She will need admission to [facility].  At the same time, patient does describe some chest pain that happened 3 days ago and now some on and off left shoulder discomfort (is not present currently).  Troponin is at 1400.  What ever inciting event occurred seem to have probably started 3 days ago when she had the severe chest pain.  I have consulted cardiology, Dr. Brayton Layman. He advised to trend troponin. Hold heparin for now.  Patient will need admission and I have discussed with Dr. Adela Glimpse. She requests a CTA of chest to r/o PE.        Final Clinical Impression(s) / ED Diagnoses Final diagnoses:  Elevated troponin    Rx / DC Orders ED Discharge Orders     None         Pricilla Loveless, MD 04/18/22 2359

## 2022-04-18 NOTE — H&P (Incomplete)
Theresa [name] QHU:[id number] DOB: [birth year] DOA: 04/18/2022     PCP: Patient, No Pcp Per (Inactive)   Outpatient Specialists:   NONE    Patient arrived to ER on 04/18/22 at 1629 Referred by Attending Pricilla Loveless, MD   Patient coming from:    home Lives  With SO    Chief Complaint:   Chief Complaint  Patient presents with  . Headache  . Numbness    HPI: Theresa [name] is a 31 y.o. female with medical history significant of marijuana abuse  Tobacco abuse  Presented with  headache and tingling Reports headaches for the past 5 days as well as intermittent numbness of the left arm and fingers nausea and dizziness 3 days ago she had an episode of passing out and have had a severe headache since.  Patient was seen on 10 June for the same but left AMA  Reports CP on Saturday lowered herself on the ground and passed out  Her left shoulder been hurting Reports left facial/headache and left shoulder pain as well as tingling of her fingertips. Symptoms been ongoing for past few days. Otherwise no fevers or chills. Patient not on birth control Patient does not drink alcohol she does smoke black and milds.     While in ER:   Trop noted to be elevated at 1400 EKG nonischemic Slight white blood cell count up to 13 noted Patient no longer having any chest pain and has not had anything for days. She does have continuous headache though.  CT scan also abnormal neurology was consulted recommended obtaining MRI. Cardiology was consulted as well    Ordered  CT HEAD IMPRESSION: 1. Multifocal age indeterminate hypodensities within the left cerebellum, left basal ganglia and potentially left subcortical white matter, question ischemic foci. Recommend further evaluation with MRI. 2. Negative for hemorrhage mass or mass effect.  CXR -   NON acute    CTA chest - ***nonacute, no PE, * no evidence of infiltrate  Following Medications were ordered in ER: Medications  aspirin  chewable tablet 324 mg (has no administration in time range)  gadobutrol (GADAVIST) 1 MMOL/ML injection 7 mL (has no administration in time range)  lactated ringers bolus 1,000 mL (1,000 mLs Intravenous New Bag/Given 04/18/22 2203)  metoCLOPramide (REGLAN) injection 10 mg (10 mg Intravenous Given 04/18/22 2204)  diphenhydrAMINE (BENADRYL) injection 25 mg (25 mg Intravenous Given 04/18/22 2205)    _______________________________________________________ ER Provider Called: Neurology    Dr. Otelia Limes They Recommend admit to medicine  requested MRI and C spine w/wo Will see on arrival to come  ER Provider Called: Cardiology Dr. Otelia Limes They Recommend admit to medicine     ED Triage Vitals  Enc Vitals Group     BP 04/18/22 1637 110/69     Pulse Rate 04/18/22 1637 75     Resp 04/18/22 1637 16     Temp 04/18/22 1637 99.2 F (37.3 C)     Temp Source 04/18/22 1637 Oral     SpO2 04/18/22 1637 100 %     Weight 04/18/22 1637 160 lb 3.2 oz (72.7 kg)     Height 04/18/22 1637 5' (1.524 m)     Head Circumference --      Peak Flow --      Pain Score 04/18/22 1718 10     Pain Loc --      Pain Edu? --      Excl. in GC? --   WSFK(81)@  _________________________________________ Significant initial  Findings: Abnormal Labs Reviewed  CBC WITH DIFFERENTIAL/PLATELET - Abnormal; Notable for the following components:      Result Value   WBC 13.0 (*)    Lymphs Abs 5.0 (*)    All other components within normal limits  COMPREHENSIVE METABOLIC PANEL - Abnormal; Notable for the following components:   Total Protein 8.5 (*)    All other components within normal limits  TROPONIN I (HIGH SENSITIVITY) - Abnormal; Notable for the following components:   Troponin I (High Sensitivity) 1,421 (*)    All other components within normal limits     _________________________ Troponin 1421 ECG: Ordered Personally reviewed by me showing: HR : 66 Rhythm: Sinus rhythm no acute ST/T changes similar to earlier in  the day QTC 341    The recent clinical data is shown below. Vitals:   04/18/22 1637 04/18/22 1848 04/18/22 2115 04/18/22 2239  BP: 110/69 106/73 (!) 89/78 123/67  Pulse: 75 78 94 69  Resp: 16 16 16 17   Temp: 99.2 F (37.3 C)  98.8 F (37.1 C)   TempSrc: Oral  Oral   SpO2: 100% 100% 98% 100%  Weight: 72.7 kg     Height: 5' (1.524 m)          WBC     Component Value Date/Time   WBC 13.0 (H) 04/18/2022 2131   LYMPHSABS 5.0 (H) 04/18/2022 2131   LYMPHSABS 3.1 02/26/2017 1534   MONOABS 0.6 04/18/2022 2131   EOSABS 0.2 04/18/2022 2131   EOSABS 0.4 02/26/2017 1534   BASOSABS 0.0 04/18/2022 2131   BASOSABS 0.0 02/26/2017 1534     UA  ordered   Urine analysis:    Component Value Date/Time   COLORURINE YELLOW 08/29/2018 1433   APPEARANCEUR CLOUDY (A) 08/29/2018 1433   LABSPEC 1.015 01/24/2022 1527   PHURINE 6.0 01/24/2022 1527   GLUCOSEU NEGATIVE 01/24/2022 1527   HGBUR NEGATIVE 01/24/2022 1527   BILIRUBINUR NEGATIVE 01/24/2022 1527   KETONESUR NEGATIVE 01/24/2022 1527   PROTEINUR NEGATIVE 01/24/2022 1527   UROBILINOGEN 0.2 01/24/2022 1527   NITRITE NEGATIVE 01/24/2022 1527   LEUKOCYTESUR MODERATE (A) 01/24/2022 1527    Results for orders placed or performed in visit on 01/24/22  Urine Culture     Status: Abnormal   Collection Time: 01/24/22  3:55 PM   Specimen: Urine   UC  Result Value Ref Range Status   Urine Culture, Routine Final report (A)  Final   Organism ID, Bacteria Escherichia coli (A)  Final    Comment: Cefazolin <=4 ug/mL Cefazolin with an MIC <=16 predicts susceptibility to the oral agents cefaclor, cefdinir, cefpodoxime, cefprozil, cefuroxime, cephalexin, and loracarbef when used for therapy of uncomplicated urinary tract infections due to E. coli, Klebsiella pneumoniae, and Proteus mirabilis. 25,000-50,000 colony forming units per mL    ORGANISM ID, BACTERIA Comment  Final    Comment: Mixed urogenital flora 10,000-25,000 colony forming units  per mL    Antimicrobial Susceptibility Comment  Final    Comment:       ** S = Susceptible; I = Intermediate; R = Resistant **                    P = Positive; N = Negative             MICS are expressed in micrograms per mL    Antibiotic                 RSLT#1  RSLT#2    RSLT#3    RSLT#4 Amoxicillin/Clavulanic Acid    S Ampicillin                     S Cefepime                       S Ceftriaxone                    S Cefuroxime                     S Ciprofloxacin                  S Ertapenem                      S Gentamicin                     S Imipenem                       S Levofloxacin                   S Meropenem                      S Nitrofurantoin                 S Piperacillin/Tazobactam        S Tetracycline                   S Tobramycin                     S Trimethoprim/Sulfa             S      _______________________________________________ Hospitalist was called for admission for syncope, chest pain, elevated troponin abnormal CT head   The following Work up has been ordered so far:  Orders Placed This Encounter  Procedures  . DG Chest 2 View  . CT Head Wo Contrast  . MR Brain W and Wo Contrast  . MR Cervical Spine W or Wo Contrast  . CBC with Differential  . Comprehensive metabolic panel  . Magnesium  . Cardiac monitoring  . Inpatient consult to Cardiology  . Consult to hospitalist  . CBG monitoring, ED  . I-Stat beta hCG blood, ED  . ED EKG  . Repeat EKG     OTHER Significant initial  Findings:  labs showing:    Recent Labs  Lab 04/18/22 2131  NA 139  K 3.9  CO2 23  GLUCOSE 86  BUN 8  CREATININE 0.83  CALCIUM 9.2  MG 2.2    Cr  stable,   Lab Results  Component Value Date   CREATININE 0.83 04/18/2022   CREATININE 0.82 11/02/2016   CREATININE 0.86 03/28/2010    Recent Labs  Lab 04/18/22 2131  AST 26  ALT 18  ALKPHOS 71  BILITOT 0.5  PROT 8.5*  ALBUMIN 4.4   Lab Results  Component Value Date   CALCIUM 9.2  04/18/2022    Plt: Lab Results  Component Value Date   PLT 223 04/18/2022        Recent Labs  Lab 04/18/22 2131  WBC 13.0*  NEUTROABS 7.1  HGB 14.7  HCT 44.2  MCV 89.5  PLT 223    HG/HCT  stable,  Component Value Date/Time   HGB 14.7 04/18/2022 2131   HGB 11.1 02/26/2017 1534   HGB 10.8 03/06/2016 0000   HCT 44.2 04/18/2022 2131   HCT 32.5 (L) 02/26/2017 1534   HCT 32 03/06/2016 0000   MCV 89.5 04/18/2022 2131   MCV 84 02/26/2017 1534     Cardiac Panel (last 3 results) No results for input(s): "CKTOTAL", "CKMB", "TROPONINI", "RELINDX" in the last 72 hours.  .car BNP (last 3 results) No results for input(s): "BNP" in the last 8760 hours.    DM  labs:  HbA1C: No results for input(s): "HGBA1C" in the last 8760 hours.     CBG (last 3)  Recent Labs    04/18/22 2233  GLUCAP 88          Cultures:    Component Value Date/Time   SDES  08/29/2018 1433    URINE, RANDOM Performed at [facility], [address], [place], [place] [address]    [facility]  08/29/2018 1433    NONE Performed at [facility], [address] [place], [place] [address]    CULT >=100,000 COLONIES/mL ESCHERICHIA COLI (A) 08/29/2018 1433   REPTSTATUS 08/31/2018 FINAL 08/29/2018 1433     Radiological Exams on Admission: DG Chest 2 View  Result Date: 04/18/2022 CLINICAL DATA:  Chest pain syncope EXAM: CHEST - 2 VIEW COMPARISON:  None Available. FINDINGS: The heart size and mediastinal contours are within normal limits. Both lungs are clear. The visualized skeletal structures are unremarkable. IMPRESSION: No active cardiopulmonary disease. Electronically Signed   By: Jasmine Pang M.D.   On: 04/18/2022 22:07   CT Head Wo Contrast  Result Date: 04/18/2022 CLINICAL DATA:  Headache and bilateral hand tingling EXAM: CT HEAD WITHOUT CONTRAST TECHNIQUE: Contiguous axial images were obtained from the base of the skull through the vertex without intravenous contrast. RADIATION  DOSE REDUCTION: This exam was performed according to the departmental dose-optimization program which includes automated exposure control, adjustment of the mA and/or kV according to patient size and/or use of iterative reconstruction technique. COMPARISON:  None Available. FINDINGS: Brain: No hemorrhage or intracranial mass. Hypodensity within the left cerebellum. Suspicion of subtle hypodensity within the left basal ganglia and potentially the subcortical white matter of the left frontal lobe. No midline shift. The ventricles are nonenlarged Vascular: No hyperdense vessels.  No unexpected calcification Skull: Normal. Negative for fracture or focal lesion. Sinuses/Orbits: No acute finding. Other: None IMPRESSION: 1. Multifocal age indeterminate hypodensities within the left cerebellum, left basal ganglia and potentially left subcortical white matter, question ischemic foci. Recommend further evaluation with MRI. 2. Negative for hemorrhage mass or mass effect. Electronically Signed   By: Jasmine Pang M.D.   On: 04/18/2022 22:06   _______________________________________________________________________________________________________ Latest  Blood pressure 123/67, pulse 69, temperature 98.8 F (37.1 C), temperature source Oral, resp. rate 17, height 5' (1.524 m), weight 72.7 kg, last menstrual period 03/30/2022, SpO2 100 %, unknown if currently breastfeeding.   Vitals  labs and radiology finding personally reviewed  Review of Systems:    Pertinent positives include:  fatigue, chest pain syncope headache localizing neurological complaints, tingling Constitutional:  No weight loss, night sweats, Fevers, chills, weight loss  HEENT:  No headaches, Difficulty swallowing,Tooth/dental problems,Sore throat,  No sneezing, itching, ear ache, nasal congestion, post nasal drip,  Cardio-vascular:    Orthopnea, PND, anasarca, dizziness, palpitations.no Bilateral lower extremity swelling  GI:  No heartburn,  indigestion, abdominal pain, nausea, vomiting, diarrhea, change in bowel habits, loss of appetite, melena, blood  in stool, hematemesis Resp:  no shortness of breath at rest. No dyspnea on exertion, No excess mucus, no productive cough, No non-productive cough, No coughing up of blood.No change in color of mucus.No wheezing. Skin:  no rash or lesions. No jaundice GU:  no dysuria, change in color of urine, no urgency or frequency. No straining to urinate.  No flank pain.  Musculoskeletal:  No joint pain or no joint swelling. No decreased range of motion. No back pain.  Psych:  No change in mood or affect. No depression or anxiety. No memory loss.  Neuro: no , no weakness, no double vision, no gait abnormality, no slurred speech, no confusion  All systems reviewed and apart from HOPI all are negative _______________________________________________________________________________________________ Past Medical History:   Past Medical History:  Diagnosis Date  . Anemia   . Chlamydia   . Depression   . Eczema   . Gonorrhea   . Pyelonephritis    X 2      Past Surgical History:  Procedure Laterality Date  . CESAREAN SECTION N/A 05/20/2017   Procedure: CESAREAN SECTION;  Surgeon: Lazaro Arms, MD;  Location: [facility] BIRTHING SUITES;  Service: Obstetrics;  Laterality: N/A;  . NO PAST SURGERIES    . TUBAL LIGATION Bilateral 05/20/2017   Procedure: BILATERAL TUBAL LIGATION;  Surgeon: Lazaro Arms, MD;  Location: [facility] BIRTHING SUITES;  Service: Obstetrics;  Laterality: Bilateral;    Social History:  Ambulatory   independently      reports that she has been smoking cigars. She quit smokeless tobacco use about 2 years ago. She reports that she does not currently use alcohol. She reports that she does not currently use drugs after having used the following drugs: Marijuana.     Family History:   Family History  Problem Relation Age of Onset  . Diabetes Mother   . Asthma Daughter   .  Williams syndrome Son   . Birth defects Neg Hx   . Stroke Neg Hx   . Hypertension Neg Hx   . Anesthesia problems Neg Hx   . Hypotension Neg Hx   . Malignant hyperthermia Neg Hx   . Pseudochol deficiency Neg Hx    ______________________________________________________________________________________________ Allergies: Allergies  Allergen Reactions  . Latex Itching, Swelling and Other (See Comments)    Reaction:  Localized swelling     Prior to Admission medications   Medication Sig Start Date End Date Taking? Authorizing Provider  metroNIDAZOLE (FLAGYL) 500 MG tablet Take 1 tablet (500 mg total) by mouth 2 (two) times daily. 01/26/22   Warner Mccreedy, MD  nitrofurantoin, macrocrystal-monohydrate, (MACROBID) 100 MG capsule Take 1 capsule (100 mg total) by mouth 2 (two) times daily. 01/24/22   Warner Mccreedy, MD    ___________________________________________________________________________________________________ Physical Exam:    04/18/2022   10:39 PM 04/18/2022    9:15 PM 04/18/2022    6:48 PM  Vitals with BMI  Systolic 123 89 106  Diastolic 67 78 73  Pulse 69 94 78     1. General:  in No  Acute distress   well   -appearing 2. Psychological: Alert and  Oriented 3. Head/ENT:    Dry Mucous Membranes                          Head Non traumatic, neck supple  Poor Dentition 4. SKIN:  decreased Skin turgor,  Skin clean Dry and intact no rash 5. Heart: Regular rate and rhythm no*** Murmur, no Rub or gallop 6. Lungs: ***Clear to auscultation bilaterally, no wheezes or crackles   7. Abdomen: Soft, ***non-tender, Non distended *** obese ***bowel sounds present 8. Lower extremities: no clubbing, cyanosis, no ***edema 9. Neurologically Grossly intact, moving all 4 extremities equally *** strength 5 out of 5 in all 4 extremities cranial nerves II through XII intact 10. MSK: Normal range of motion    Chart has been  reviewed  ______________________________________________________________________________________________  Assessment/Plan a 31 y.o. female with medical history significant of marijuana abuse     Admitted for syncope, chest pain, elevated troponin abnormal CT head    Present on Admission: **None**     No problem-specific Assessment & Plan notes found for this encounter.    Other plan as per orders.  DVT prophylaxis:  SCD *** Lovenox       Code Status:    Code Status: Prior FULL CODE *** DNR/DNI ***comfort care as per patient ***family  I had personally discussed CODE STATUS with patient and family* I had spent *min discussing goals of care and CODE STATUS    Family Communication:   Family not at  Bedside  plan of care was discussed on the phone with *** Son, Daughter, Wife, Husband, Sister, Brother , father, mother  Disposition Plan:   *** likely will need placement for rehabilitation                          Back to current facility when stable                            To home once workup is complete and patient is stable  ***Following barriers for discharge:                            Electrolytes corrected                               Anemia corrected                             Pain controlled with PO medications                               Afebrile, white count improving able to transition to PO antibiotics                             Will need to be able to tolerate PO                            Will likely need home health, home O2, set up                           Will need consultants to evaluate patient prior to discharge  ****EXPECT DC tomorrow                    ***Would benefit from PT/OT eval prior to DC  Ordered  Swallow eval - SLP ordered                   Diabetes care coordinator                   Transition of care consulted                   Nutrition    consulted                  Wound care  consulted                    Palliative care    consulted                   Behavioral health  consulted                    Consults called:    neurolog is aware, Cardiology is aware  Admission status:  ED Disposition     None        Obs       Level of care          progressive tele indefinitely please discontinue once patient no longer qualifies COVID-19 Labs     Elika Godar 04/18/2022, 11:30 PM ***  [facility]     after 2 AM please page floor coverage PA If 7AM-7PM, please contact the day team taking care of the patient using [email or web address]   Patient was evaluated in the context of the global COVID-19 pandemic, which necessitated consideration that the patient might be at risk for infection with the SARS-CoV-2 virus that causes COVID-19. Institutional protocols and algorithms that pertain to the evaluation of patients at risk for COVID-19 are in a state of rapid change based on information released by regulatory bodies including the CDC and federal and state organizations. These policies and algorithms were followed during the patient's care.

## 2022-04-18 NOTE — ED Provider Triage Note (Signed)
Emergency Medicine Provider Triage Evaluation Note  Theresa [name] , a 31 y.o. female  was evaluated in triage.  Pt complains of headache, bilateral hand tingling.  This has been ongoing, she was screened by me 2 days ago but left after refusing medical care..  Review of Systems  Positive: Headache, tingling Negative: Chest pain, sob  Physical Exam  BP 110/69 (BP Location: Right Arm)   Pulse 75   Temp 99.2 F (37.3 C) (Oral)   Resp 16   Ht 5' (1.524 m)   Wt 72.7 kg   LMP 03/30/2022 (Exact Date)   SpO2 100%   BMI 31.29 kg/m  Gen:   Awake, no distress   Resp:  Normal effort  MSK:   Moves extremities without difficulty  Other:    Medical Decision Making  Medically screening exam initiated at 5:25 PM.  Appropriate orders placed.  Theresa [name] was informed that the remainder of the evaluation will be completed by another provider, this initial triage assessment does not replace that evaluation, and the importance of remaining in the ED until their evaluation is complete.     Claude Manges, PA-C 04/18/22 1725

## 2022-04-19 ENCOUNTER — Observation Stay (HOSPITAL_COMMUNITY): Payer: Medicaid Other

## 2022-04-19 ENCOUNTER — Ambulatory Visit (HOSPITAL_COMMUNITY): Payer: Medicaid Other

## 2022-04-19 ENCOUNTER — Observation Stay (HOSPITAL_BASED_OUTPATIENT_CLINIC_OR_DEPARTMENT_OTHER): Payer: Medicaid Other

## 2022-04-19 ENCOUNTER — Encounter (HOSPITAL_COMMUNITY): Payer: Medicaid Other

## 2022-04-19 ENCOUNTER — Other Ambulatory Visit (HOSPITAL_COMMUNITY): Payer: Medicaid Other

## 2022-04-19 ENCOUNTER — Encounter (HOSPITAL_COMMUNITY): Payer: Self-pay | Admitting: Internal Medicine

## 2022-04-19 DIAGNOSIS — R778 Other specified abnormalities of plasma proteins: Secondary | ICD-10-CM | POA: Diagnosis not present

## 2022-04-19 DIAGNOSIS — I639 Cerebral infarction, unspecified: Secondary | ICD-10-CM | POA: Diagnosis present

## 2022-04-19 DIAGNOSIS — I6389 Other cerebral infarction: Secondary | ICD-10-CM

## 2022-04-19 DIAGNOSIS — E669 Obesity, unspecified: Secondary | ICD-10-CM

## 2022-04-19 DIAGNOSIS — R55 Syncope and collapse: Secondary | ICD-10-CM

## 2022-04-19 DIAGNOSIS — F1290 Cannabis use, unspecified, uncomplicated: Secondary | ICD-10-CM | POA: Diagnosis not present

## 2022-04-19 DIAGNOSIS — I6502 Occlusion and stenosis of left vertebral artery: Secondary | ICD-10-CM | POA: Diagnosis not present

## 2022-04-19 DIAGNOSIS — I7774 Dissection of vertebral artery: Secondary | ICD-10-CM

## 2022-04-19 DIAGNOSIS — R519 Headache, unspecified: Secondary | ICD-10-CM

## 2022-04-19 LAB — LIPID PANEL
Cholesterol: 127 mg/dL (ref 0–200)
HDL: 37 mg/dL — ABNORMAL LOW (ref >40)
LDL Cholesterol: 72 mg/dL (ref 0–99)
Total CHOL/HDL Ratio: 3.4 RATIO
Triglycerides: 89 mg/dL (ref <150)
VLDL: 18 mg/dL (ref 0–40)

## 2022-04-19 LAB — CBC
HCT: 38.7 % (ref 36.0–46.0)
Hemoglobin: 13.2 g/dL (ref 12.0–15.0)
MCH: 30.6 pg (ref 26.0–34.0)
MCHC: 34.1 g/dL (ref 30.0–36.0)
MCV: 89.8 fL (ref 80.0–100.0)
Platelets: 188 10*3/uL (ref 150–400)
RBC: 4.31 MIL/uL (ref 3.87–5.11)
RDW: 13.2 % (ref 11.5–15.5)
WBC: 14.1 10*3/uL — ABNORMAL HIGH (ref 4.0–10.5)
nRBC: 0 % (ref 0.0–0.2)

## 2022-04-19 LAB — URINALYSIS, COMPLETE (UACMP) WITH MICROSCOPIC
Bacteria, UA: NONE SEEN
Bilirubin Urine: NEGATIVE
Glucose, UA: NEGATIVE mg/dL
Hgb urine dipstick: NEGATIVE
Ketones, ur: 5 mg/dL — AB
Leukocytes,Ua: NEGATIVE
Nitrite: NEGATIVE
Protein, ur: NEGATIVE mg/dL
Specific Gravity, Urine: 1.017 (ref 1.005–1.030)
pH: 7 (ref 5.0–8.0)

## 2022-04-19 LAB — HEMOGLOBIN A1C
Hgb A1c MFr Bld: 5.5 % (ref 4.8–5.6)
Mean Plasma Glucose: 111.15 mg/dL

## 2022-04-19 LAB — TROPONIN I (HIGH SENSITIVITY)
Troponin I (High Sensitivity): 1382 ng/L (ref <18)
Troponin I (High Sensitivity): 1028 ng/L (ref <18)
Troponin I (High Sensitivity): 1059 ng/L (ref <18)

## 2022-04-19 LAB — RAPID URINE DRUG SCREEN, HOSP PERFORMED
Amphetamines: NOT DETECTED
Barbiturates: NOT DETECTED
Benzodiazepines: NOT DETECTED
Cocaine: NOT DETECTED
Opiates: NOT DETECTED
Tetrahydrocannabinol: POSITIVE — AB

## 2022-04-19 LAB — ECHOCARDIOGRAM COMPLETE BUBBLE STUDY
AR max vel: 2.43 cm2
AV Area VTI: 2.44 cm2
AV Area mean vel: 2.17 cm2
AV Mean grad: 2 mmHg
AV Peak grad: 4.5 mmHg
Ao pk vel: 1.06 m/s
Area-P 1/2: 2.92 cm2
Height: 60 in
S' Lateral: 3.2 cm
Weight: 2563.2 oz

## 2022-04-19 LAB — COMPREHENSIVE METABOLIC PANEL
ALT: 14 U/L (ref 0–44)
AST: 18 U/L (ref 15–41)
Albumin: 3.5 g/dL (ref 3.5–5.0)
Alkaline Phosphatase: 58 U/L (ref 38–126)
Anion gap: 5 (ref 5–15)
BUN: 6 mg/dL (ref 6–20)
CO2: 22 mmol/L (ref 22–32)
Calcium: 8.9 mg/dL (ref 8.9–10.3)
Chloride: 110 mmol/L (ref 98–111)
Creatinine, Ser: 0.84 mg/dL (ref 0.44–1.00)
GFR, Estimated: >60 mL/min (ref >60)
Glucose, Bld: 90 mg/dL (ref 70–99)
Potassium: 3.7 mmol/L (ref 3.5–5.1)
Sodium: 137 mmol/L (ref 135–145)
Total Bilirubin: 0.7 mg/dL (ref 0.3–1.2)
Total Protein: 6.8 g/dL (ref 6.5–8.1)

## 2022-04-19 LAB — CK: Total CK: 160 U/L (ref 38–234)

## 2022-04-19 LAB — HEPARIN LEVEL (UNFRACTIONATED): Heparin Unfractionated: <0.1 IU/mL — ABNORMAL LOW (ref 0.30–0.70)

## 2022-04-19 LAB — BRAIN NATRIURETIC PEPTIDE: B Natriuretic Peptide: 23.9 pg/mL (ref 0.0–100.0)

## 2022-04-19 LAB — C-REACTIVE PROTEIN: CRP: <0.5 mg/dL (ref <1.0)

## 2022-04-19 LAB — SEDIMENTATION RATE: Sed Rate: 26 mm/hr — ABNORMAL HIGH (ref 0–22)

## 2022-04-19 LAB — TSH: TSH: 1.856 u[IU]/mL (ref 0.350–4.500)

## 2022-04-19 LAB — SARS CORONAVIRUS 2 BY RT PCR: SARS Coronavirus 2 by RT PCR: NEGATIVE

## 2022-04-19 LAB — PHOSPHORUS: Phosphorus: 2.8 mg/dL (ref 2.5–4.6)

## 2022-04-19 MED ORDER — PERFLUTREN LIPID MICROSPHERE
1.0000 mL | INTRAVENOUS | Status: AC | PRN
  Administered 2022-04-19: 2 mL via INTRAVENOUS

## 2022-04-19 MED ORDER — ACETAMINOPHEN 325 MG PO TABS
650.0000 mg | ORAL_TABLET | Freq: Four times a day (QID) | ORAL | Status: DC | PRN
  Administered 2022-04-19 (×3): 650 mg via ORAL
  Filled 2022-04-19 (×4): qty 2

## 2022-04-19 MED ORDER — LACTATED RINGERS IV BOLUS
500.0000 mL | Freq: Once | INTRAVENOUS | Status: AC
Start: 2022-04-19 — End: 2022-04-19
  Administered 2022-04-19: 500 mL via INTRAVENOUS

## 2022-04-19 MED ORDER — SODIUM CHLORIDE (PF) 0.9 % IJ SOLN
INTRAMUSCULAR | Status: AC
  Filled 2022-04-19: qty 50

## 2022-04-19 MED ORDER — SODIUM CHLORIDE 0.9 % IV SOLN: INTRAVENOUS | Status: AC

## 2022-04-19 MED ORDER — ROSUVASTATIN CALCIUM 5 MG PO TABS
10.0000 mg | ORAL_TABLET | Freq: Every day | ORAL | Status: DC
  Administered 2022-04-19: 10 mg via ORAL
  Filled 2022-04-19: qty 2

## 2022-04-19 MED ORDER — HEPARIN (PORCINE) 25000 UT/250ML-% IV SOLN
900.0000 [IU]/h | INTRAVENOUS | Status: DC
  Administered 2022-04-19: 750 [IU]/h via INTRAVENOUS
  Filled 2022-04-19: qty 250

## 2022-04-19 MED ORDER — STROKE: EARLY STAGES OF RECOVERY BOOK
Freq: Once | Status: DC
  Filled 2022-04-19: qty 1

## 2022-04-19 MED ORDER — HYDROCODONE-ACETAMINOPHEN 5-325 MG PO TABS
1.0000 | ORAL_TABLET | ORAL | Status: DC | PRN
  Filled 2022-04-19: qty 1

## 2022-04-19 MED ORDER — ASPIRIN 81 MG PO TBEC: 81.0000 mg | DELAYED_RELEASE_TABLET | Freq: Every day | ORAL | Status: DC

## 2022-04-19 MED ORDER — IOHEXOL 350 MG/ML SOLN
80.0000 mL | Freq: Once | INTRAVENOUS | Status: AC | PRN
  Administered 2022-04-19: 75 mL via INTRAVENOUS

## 2022-04-19 MED ORDER — IOHEXOL 350 MG/ML SOLN
60.0000 mL | Freq: Once | INTRAVENOUS | Status: AC | PRN
  Administered 2022-04-19: 60 mL via INTRAVENOUS

## 2022-04-19 MED ORDER — ACETAMINOPHEN 650 MG RE SUPP: 650.0000 mg | Freq: Four times a day (QID) | RECTAL | Status: DC | PRN

## 2022-04-19 MED ORDER — PROCHLORPERAZINE EDISYLATE 10 MG/2ML IJ SOLN
10.0000 mg | Freq: Once | INTRAMUSCULAR | Status: AC
  Administered 2022-04-19: 10 mg via INTRAVENOUS
  Filled 2022-04-19: qty 2

## 2022-04-19 NOTE — Progress Notes (Signed)
2D echocardiogram with bubble study completed.  04/19/2022 2:19 PM Eula Fried., MHA, RVT, RDCS, RDMS

## 2022-04-19 NOTE — Consult Note (Addendum)
No Cardiology Consultation:   Patient ID: Theresa [name] MRN: [id number]; DOB: 1991-10-20  Admit date: 04/18/2022 Date of Consult: 04/19/2022  PCP:  Patient, No Pcp Per (Inactive)   [facility] Providers Cardiologist:  None      Patient Profile:   Theresa [name] is a 31 y.o. female with a hx of marijuana and tobacco use who is being seen 04/19/2022 for the evaluation of stroke, elevated troponin at the request of Dr. Lowell Guitar.  History of Present Illness:   Theresa [name] is a 31 year old female with above medical history who does not have cardiac history and who has never seen a cardiologist.   Patient presented to the ED on 6/10 complaining of near syncope, headache, and chest pain. However, patient did not want to wait in triage and left after screening without being seen by any providers. EKG showed normal sinus rhythm with sinus arrhythmia.    Patient again presented to the ED on 6/13 complaining that she had a headache for the past 5 days, intermittent numbness to the left arm and fingers, nausea, dizziness. Reportedly had passed out three days ago. Labs in the ED showed Na 139, K 3.9, creatinine 0.83, magnesium 2.2, WBC 13.0, hemoglobin 24.7, platelets 223. hsTn 1421>>1382>>1028>>1059. BNP normal at 23.9. UDS positive for marijuana, otherwise negative. TSH within normal limits.   CXR showed no active cardiopulmonary disease   CT head showed mutifocal age indeterminate hypodensities within the left cerebellum, left basal ganglia, and potentially left subcortical white matter. Recommended MRI for further evaluation   MRI brain showed patchy multifocal acute to early subacute ischemic infarcts involving the left frontal lobe and left cerebellum.   MRI Cervical spine showed loss of normal flow void within the left vertebral artery, which could be related to slow flow and/or occlusion. Given the presence of the acute left cerebellar infarcts, findings raises the possibility for underlying  dissection.   CT angio neck showed increasing attenuation within the proximal left vertebral artery within the neck, with subsequent occlusion by the V2 segment. Left vertebral artery remains largely occluded distally to the vertebrobasilar junction. Finding is presumably acute given the presence of the acute left cerebellar infarcts, possibly reflecting a dissection.  CT angio chest showed no evidence of pulmonary embolus, no acute cardiopulmonary disease.   On interview, patient reports that on 6/10, she felt weak all day. Felt like she couldn't even stand up due to weakness. Also had a left sided headache, left sided neck pain, left shoulder pain, and left sided chest pain. Numbness/tingling in both hands. Chest pain was described as pressure, felt like "her chest was caving in." Associated with SOB. Patient passed out for about 45 minutes, went to the ED. Did not want to wait 6 hours in the ED so she left before being seen. Patient continued to have headache, neck pain, left shoulder pain since then. However, she has not had any recurrence of chest pain since. Denies chest pain while walking, laying flat, coughing, deep breathing.    Past Medical History:  Diagnosis Date   Anemia    Chlamydia    Depression    Eczema    Gonorrhea    Pyelonephritis    X 2    Past Surgical History:  Procedure Laterality Date   CESAREAN SECTION N/A 05/20/2017   Procedure: CESAREAN SECTION;  Surgeon: Lazaro Arms, MD;  Location: [facility] BIRTHING SUITES;  Service: Obstetrics;  Laterality: N/A;   NO PAST SURGERIES  TUBAL LIGATION Bilateral 05/20/2017   Procedure: BILATERAL TUBAL LIGATION;  Surgeon: Lazaro ArmsEure, Luther H, MD;  Location: [facility] BIRTHING SUITES;  Service: Obstetrics;  Laterality: Bilateral;     Home Medications:  Prior to Admission medications   Medication Sig Start Date End Date Taking? Authorizing Provider  metroNIDAZOLE (FLAGYL) 500 MG tablet Take 1 tablet (500 mg total) by mouth 2 (two) times  daily. Patient not taking: Reported on 04/19/2022 01/26/22   Warner Mccreedyas, Anuka, MD  nitrofurantoin, macrocrystal-monohydrate, (MACROBID) 100 MG capsule Take 1 capsule (100 mg total) by mouth 2 (two) times daily. Patient not taking: Reported on 04/19/2022 01/24/22   Warner Mccreedyas, Anuka, MD    Inpatient Medications: Scheduled Meds:   stroke: early stages of recovery book   Does not apply Once   rosuvastatin  10 mg Oral Daily   Continuous Infusions:  heparin 750 Units/hr (04/19/22 0501)   PRN Meds: acetaminophen **OR** acetaminophen, HYDROcodone-acetaminophen  Allergies:    Allergies  Allergen Reactions   Latex Itching, Swelling and Other (See Comments)    Reaction:  Localized swelling    Social History:   Social History   Socioeconomic History   Marital status: Single    Spouse name: Not on file   Number of children: Not on file   Years of education: Not on file   Highest education level: Not on file  Occupational History   Not on file  Tobacco Use   Smoking status: Some Days    Types: Cigars   Smokeless tobacco: Former    Quit date: 07/08/2019   Tobacco comments:    4 cig/day  Vaping Use   Vaping Use: Never used  Substance and Sexual Activity   Alcohol use: Not Currently    Comment: occas. before knowing of pregnancy   Drug use: Not Currently    Types: Marijuana    Comment: last use one week ago   Sexual activity: Yes    Birth control/protection: None, Surgical  Other Topics Concern   Not on file  Social History Narrative   Not on file   Social Determinants of Health   Financial Resource Strain: Not on file  Food Insecurity: Not on file  Transportation Needs: Unmet Transportation Needs (05/11/2020)   PRAPARE - Administrator, Civil ServiceTransportation    Lack of Transportation (Medical): Yes    Lack of Transportation (Non-Medical): Yes  Physical Activity: Not on file  Stress: Not on file  Social Connections: Not on file  Intimate Partner Violence: Not on file    Family History:    Family History   Problem Relation Age of Onset   Diabetes Mother    Asthma Daughter    Mayford KnifeWilliams syndrome Son    Birth defects Neg Hx    Stroke Neg Hx    Hypertension Neg Hx    Anesthesia problems Neg Hx    Hypotension Neg Hx    Malignant hyperthermia Neg Hx    Pseudochol deficiency Neg Hx      ROS:  Please see the history of present illness.   All other ROS reviewed and negative.     Physical Exam/Data:   Vitals:   04/19/22 0745 04/19/22 0800 04/19/22 0913 04/19/22 1214  BP: 100/70 118/77 105/81 117/76  Pulse: (!) 59 (!) 58 (!) 58 61  Resp: (!) 22 (!) 23 11 14   Temp:   98.2 F (36.8 C) 98.6 F (37 C)  TempSrc:   Oral Oral  SpO2: 100% 100% 100%   Weight:      Height:  Intake/Output Summary (Last 24 hours) at 04/19/2022 1217 Last data filed at 04/19/2022 1000 Gross per 24 hour  Intake 240 ml  Output --  Net 240 ml      04/18/2022    4:37 PM 01/24/2022    3:33 PM 09/19/2021    3:13 PM  Last 3 Weights  Weight (lbs) 160 lb 3.2 oz 163 lb 164 lb  Weight (kg) 72.666 kg 73.936 kg 74.39 kg     Body mass index is 31.29 kg/m.  General:  Well nourished, well developed, in no acute distress. Sitting comfortably in the bed  HEENT: normal Neck: no JVD Vascular: Distal pulses 2+ bilaterally Cardiac:  normal S1, S2; RRR; no murmur  Lungs:  clear to auscultation bilaterally, no wheezing, rhonchi or rales  Abd: soft, nontender, no hepatomegaly  Ext: no edema Musculoskeletal:  No deformities, BUE and BLE strength normal and equal Skin: warm and dry  Neuro:  CNs 2-12 intact, no focal abnormalities noted Psych:  Normal affect   EKG:  The EKG was personally reviewed and demonstrates:  Sinus rhythm with sinus arrhythmia, RSR' in V1   Telemetry:  Telemetry was personally reviewed and demonstrates:  Sinus rhythm   Relevant CV Studies:  Echocardiogram pending   Laboratory Data:  High Sensitivity Troponin:   Recent Labs  Lab 04/18/22 2131 04/18/22 2322 04/19/22 0310  04/19/22 0545  TROPONINIHS 1,421* 1,382* 1,028* 1,059*     Chemistry Recent Labs  Lab 04/18/22 2131 04/19/22 0545  NA 139 137  K 3.9 3.7  CL 110 110  CO2 23 22  GLUCOSE 86 90  BUN 8 6  CREATININE 0.83 0.84  CALCIUM 9.2 8.9  MG 2.2  --   GFRNONAA >60 >60  ANIONGAP 6 5    Recent Labs  Lab 04/18/22 2131 04/19/22 0545  PROT 8.5* 6.8  ALBUMIN 4.4 3.5  AST 26 18  ALT 18 14  ALKPHOS 71 58  BILITOT 0.5 0.7   Lipids  Recent Labs  Lab 04/19/22 0310  CHOL 127  TRIG 89  HDL 37*  LDLCALC 72  CHOLHDL 3.4    Hematology Recent Labs  Lab 04/18/22 2131 04/19/22 0310  WBC 13.0* 14.1*  RBC 4.94 4.31  HGB 14.7 13.2  HCT 44.2 38.7  MCV 89.5 89.8  MCH 29.8 30.6  MCHC 33.3 34.1  RDW 13.3 13.2  PLT 223 188   Thyroid  Recent Labs  Lab 04/19/22 0310  TSH 1.856    BNP Recent Labs  Lab 04/19/22 0000  BNP 23.9    DDimer No results for input(s): "DDIMER" in the last 168 hours.   Radiology/Studies:  CT ANGIO HEAD W OR WO CONTRAST  Result Date: 04/19/2022 CLINICAL DATA:  Follow-up examination for acute stroke. EXAM: CT ANGIOGRAPHY HEAD AND NECK TECHNIQUE: Multidetector CT imaging of the head and neck was performed using the standard protocol during bolus administration of intravenous contrast. Multiplanar CT image reconstructions and MIPs were obtained to evaluate the vascular anatomy. Carotid stenosis measurements (when applicable) are obtained utilizing NASCET criteria, using the distal internal carotid diameter as the denominator. RADIATION DOSE REDUCTION: This exam was performed according to the departmental dose-optimization program which includes automated exposure control, adjustment of the mA and/or kV according to patient size and/or use of iterative reconstruction technique. CONTRAST:  60mL OMNIPAQUE IOHEXOL 350 MG/ML SOLN COMPARISON:  Prior MRI from earlier the same day and CT from 04/18/2022. FINDINGS: CTA NECK FINDINGS Aortic arch: Visualized aortic arch normal  in caliber with  standard 3 vessel morphology. No stenosis or other abnormality about the origin the great vessels. Right carotid system: Right common and internal carotid arteries widely patent without stenosis, dissection or occlusion. Left carotid system: Left common and internal carotid arteries widely patent without stenosis, dissection or occlusion. Vertebral arteries: Both vertebral arteries arise from the subclavian arteries. No proximal subclavian artery stenosis. Right vertebral artery widely patent within the neck without stenosis or other abnormality. Left vertebral artery patent at its origin, but becomes it recently attenuated as it courses superiorly within the neck. The left vertebral artery occludes by the mid-distal left V2 segment, and remains occluded to the skull base. Skeleton: No discrete or worrisome osseous lesions. Other neck: No other acute soft tissue abnormality within the neck. Upper chest: Visualized upper chest demonstrates no acute finding. Paraseptal emphysematous changes present at the lung apices. Review of the MIP images confirms the above findings CTA HEAD FINDINGS Anterior circulation: Both internal carotid arteries patent to the termini without stenosis. A1 segments patent. Normal anterior communicating complex. Anterior cerebral arteries widely patent. No M1 stenosis or occlusion right no visible proximal MCA branch occlusion. Distal MCA branches perfused and symmetric. Posterior circulation: Right vertebral artery widely patent to the vertebrobasilar junction. Right PICA faintly visible and is patent at its origin. Left vertebral artery occluded at the skull base. Minimal retrograde filling of the distal left V4 segment at the vertebrobasilar junction. Left V4 segment otherwise occluded. Left PICA not well seen. Basilar patent to its distal aspect without stenosis. Superior cerebellar and posterior cerebral arteries patent bilaterally. Venous sinuses: Patent allowing for  timing the contrast bolus. Anatomic variants: None significant.  No aneurysm. Review of the MIP images confirms the above findings IMPRESSION: 1. Increasing attenuation within the proximal left vertebral artery within the neck, with subsequent occlusion by the V2 segment. Left vertebral artery remains largely occluded distally to the vertebrobasilar junction. Finding is presumably acute given the presence of the acute left cerebellar infarcts, possibly reflecting a dissection. 2. Otherwise normal CTA of the head and neck. No other large vessel occlusion. No other hemodynamically significant or correctable stenosis. 3.  Emphysema (ICD10-J43.9). Electronically Signed   By: Rise Mu M.D.   On: 04/19/2022 03:13   CT ANGIO NECK W OR WO CONTRAST  Result Date: 04/19/2022 CLINICAL DATA:  Follow-up examination for acute stroke. EXAM: CT ANGIOGRAPHY HEAD AND NECK TECHNIQUE: Multidetector CT imaging of the head and neck was performed using the standard protocol during bolus administration of intravenous contrast. Multiplanar CT image reconstructions and MIPs were obtained to evaluate the vascular anatomy. Carotid stenosis measurements (when applicable) are obtained utilizing NASCET criteria, using the distal internal carotid diameter as the denominator. RADIATION DOSE REDUCTION: This exam was performed according to the departmental dose-optimization program which includes automated exposure control, adjustment of the mA and/or kV according to patient size and/or use of iterative reconstruction technique. CONTRAST:  70mL OMNIPAQUE IOHEXOL 350 MG/ML SOLN COMPARISON:  Prior MRI from earlier the same day and CT from 04/18/2022. FINDINGS: CTA NECK FINDINGS Aortic arch: Visualized aortic arch normal in caliber with standard 3 vessel morphology. No stenosis or other abnormality about the origin the great vessels. Right carotid system: Right common and internal carotid arteries widely patent without stenosis,  dissection or occlusion. Left carotid system: Left common and internal carotid arteries widely patent without stenosis, dissection or occlusion. Vertebral arteries: Both vertebral arteries arise from the subclavian arteries. No proximal subclavian artery stenosis. Right vertebral artery widely patent within the  neck without stenosis or other abnormality. Left vertebral artery patent at its origin, but becomes it recently attenuated as it courses superiorly within the neck. The left vertebral artery occludes by the mid-distal left V2 segment, and remains occluded to the skull base. Skeleton: No discrete or worrisome osseous lesions. Other neck: No other acute soft tissue abnormality within the neck. Upper chest: Visualized upper chest demonstrates no acute finding. Paraseptal emphysematous changes present at the lung apices. Review of the MIP images confirms the above findings CTA HEAD FINDINGS Anterior circulation: Both internal carotid arteries patent to the termini without stenosis. A1 segments patent. Normal anterior communicating complex. Anterior cerebral arteries widely patent. No M1 stenosis or occlusion right no visible proximal MCA branch occlusion. Distal MCA branches perfused and symmetric. Posterior circulation: Right vertebral artery widely patent to the vertebrobasilar junction. Right PICA faintly visible and is patent at its origin. Left vertebral artery occluded at the skull base. Minimal retrograde filling of the distal left V4 segment at the vertebrobasilar junction. Left V4 segment otherwise occluded. Left PICA not well seen. Basilar patent to its distal aspect without stenosis. Superior cerebellar and posterior cerebral arteries patent bilaterally. Venous sinuses: Patent allowing for timing the contrast bolus. Anatomic variants: None significant.  No aneurysm. Review of the MIP images confirms the above findings IMPRESSION: 1. Increasing attenuation within the proximal left vertebral artery  within the neck, with subsequent occlusion by the V2 segment. Left vertebral artery remains largely occluded distally to the vertebrobasilar junction. Finding is presumably acute given the presence of the acute left cerebellar infarcts, possibly reflecting a dissection. 2. Otherwise normal CTA of the head and neck. No other large vessel occlusion. No other hemodynamically significant or correctable stenosis. 3.  Emphysema (ICD10-J43.9). Electronically Signed   By: Rise Mu M.D.   On: 04/19/2022 03:13   CT Angio Chest PE W and/or Wo Contrast  Result Date: 04/19/2022 CLINICAL DATA:  Syncope. Pulmonary embolism (PE) suspected, high prob EXAM: CT ANGIOGRAPHY CHEST WITH CONTRAST TECHNIQUE: Multidetector CT imaging of the chest was performed using the standard protocol during bolus administration of intravenous contrast. Multiplanar CT image reconstructions and MIPs were obtained to evaluate the vascular anatomy. RADIATION DOSE REDUCTION: This exam was performed according to the departmental dose-optimization program which includes automated exposure control, adjustment of the mA and/or kV according to patient size and/or use of iterative reconstruction technique. CONTRAST:  75mL OMNIPAQUE IOHEXOL 350 MG/ML SOLN COMPARISON:  None Available. FINDINGS: Cardiovascular: No filling defects in the pulmonary arteries to suggest pulmonary emboli. Heart is normal size. Aorta is normal caliber. Mediastinum/Nodes: No mediastinal, hilar, or axillary adenopathy. Trachea and esophagus are unremarkable. Thyroid unremarkable. Lungs/Pleura: Lungs are clear. No focal airspace opacities or suspicious nodules. No effusions. Upper Abdomen: Imaging into the upper abdomen demonstrates no acute findings. Musculoskeletal: Chest wall soft tissues are unremarkable. No acute bony abnormality. Review of the MIP images confirms the above findings. IMPRESSION: No evidence of pulmonary embolus. No acute cardiopulmonary disease.  Electronically Signed   By: Charlett Nose M.D.   On: 04/19/2022 02:38   MR Cervical Spine W or Wo Contrast  Result Date: 04/19/2022 CLINICAL DATA:  Initial evaluation for multiple sclerosis. EXAM: MRI CERVICAL SPINE WITHOUT AND WITH CONTRAST TECHNIQUE: Multiplanar and multiecho pulse sequences of the cervical spine, to include the craniocervical junction and cervicothoracic junction, were obtained without and with intravenous contrast. CONTRAST:  7mL GADAVIST GADOBUTROL 1 MMOL/ML IV SOLN COMPARISON:  Comparison made with corresponding brain MRI performed at the same  time. FINDINGS: Alignment: Straightening of the normal cervical lordosis. No listhesis. Vertebrae: Vertebral body height maintained without acute or chronic fracture. Bone marrow signal intensity diffusely decreased on T1 weighted sequence, nonspecific, but most commonly related to anemia, smoking or obesity. No focal marrow replacing lesion. No abnormal marrow edema or enhancement. Cord: Normal signal and morphology. No evidence for demyelinating disease. No abnormal enhancement. Posterior Fossa, vertebral arteries, paraspinal tissues: Loss of normal flow void within the left vertebral artery, which could be related to slow flow and/or occlusion. Paraspinous soft tissues otherwise unremarkable. Disc levels: No significant disc pathology seen within the cervical spine. No focal disc herniation. No significant canal or neural foraminal stenosis or evidence for neural impingement. IMPRESSION: 1. Loss of normal flow void within the left vertebral artery, which could be related to slow flow and/or occlusion. Given the presence of the acute left cerebellar infarcts, finding raises the possibility for underlying dissection. Further evaluation with dedicated vascular imaging of the neck suggested for further evaluation. 2. Otherwise unremarkable and normal MRI of the cervical spine and spinal cord. No evidence for demyelinating disease. No significant  stenosis or impingement. Electronically Signed   By: Rise Mu M.D.   On: 04/19/2022 01:22   MR Brain W and Wo Contrast  Result Date: 04/19/2022 CLINICAL DATA:  Initial evaluation for multiple sclerosis. EXAM: MRI HEAD WITHOUT AND WITH CONTRAST TECHNIQUE: Multiplanar, multiecho pulse sequences of the brain and surrounding structures were obtained without and with intravenous contrast. CONTRAST:  26mL GADAVIST GADOBUTROL 1 MMOL/ML IV SOLN COMPARISON:  Prior CT from 04/18/2022. FINDINGS: Brain: Cerebral volume within normal limits. Scattered patchy diffusion signal abnormality seen involving the cortical and subcortical aspect of the left frontal operculum and subinsular region. Additional patchy diffusion abnormality present at the inferior left cerebellum. Findings consistent with evolving acute to early subacute ischemic infarcts. No associated hemorrhage or significant regional mass effect. No other focal parenchymal signal abnormality. No significant underlying cerebral white matter disease. No evidence for demyelinating disease. No acute or chronic intracranial blood products. No mass lesion, mass effect, or midline shift. No hydrocephalus or extra-axial fluid collection. Pituitary gland suprasellar region normal. No abnormal enhancement. Vascular: Major intracranial vascular flow voids are grossly maintained. Skull and upper cervical spine: Craniocervical junction within normal limits. Bone marrow signal intensity diffusely decreased on T1 weighted sequence, nonspecific, but most commonly related to anemia, smoking or obesity. No focal marrow replacing lesion. No scalp soft tissue abnormality. Sinuses/Orbits: Globes and orbital soft tissues within normal limits. Paranasal sinuses are clear. No mastoid effusion. Other: None. IMPRESSION: 1. 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 No associated hemorrhage or significant regional  mass effect. 2. Otherwise normal brain MRI. No evidence for demyelinating disease. Electronically Signed   By: Rise Mu M.D.   On: 04/19/2022 01:15   DG Chest 2 View  Result Date: 04/18/2022 CLINICAL DATA:  Chest pain syncope EXAM: CHEST - 2 VIEW COMPARISON:  None Available. FINDINGS: The heart size and mediastinal contours are within normal limits. Both lungs are clear. The visualized skeletal structures are unremarkable. IMPRESSION: No active cardiopulmonary disease. Electronically Signed   By: Jasmine Pang M.D.   On: 04/18/2022 22:07   CT Head Wo Contrast  Result Date: 04/18/2022 CLINICAL DATA:  Headache and bilateral hand tingling EXAM: CT HEAD WITHOUT CONTRAST TECHNIQUE: Contiguous axial images were obtained from the base of the skull through the vertex without intravenous contrast. RADIATION DOSE REDUCTION: This  exam was performed according to the departmental dose-optimization program which includes automated exposure control, adjustment of the mA and/or kV according to patient size and/or use of iterative reconstruction technique. COMPARISON:  None Available. FINDINGS: Brain: No hemorrhage or intracranial mass. Hypodensity within the left cerebellum. Suspicion of subtle hypodensity within the left basal ganglia and potentially the subcortical white matter of the left frontal lobe. No midline shift. The ventricles are nonenlarged Vascular: No hyperdense vessels.  No unexpected calcification Skull: Normal. Negative for fracture or focal lesion. Sinuses/Orbits: No acute finding. Other: None IMPRESSION: 1. Multifocal age indeterminate hypodensities within the left cerebellum, left basal ganglia and potentially left subcortical white matter, question ischemic foci. Recommend further evaluation with MRI. 2. Negative for hemorrhage mass or mass effect. Electronically Signed   By: Jasmine Pang M.D.   On: 04/18/2022 22:06     Assessment and Plan:   Chest Pain  Elevated Troponin  - hsTn  1421>>1382>>1028>>1059 - Patient reports having an episode of chest pain/pressure several days ago. Associated with a syncopal event, left neck pain, left shoulder pain, headache. Has not had chest pain since    - Troponin elevation is flat, also occurring in the setting of acute strokes and left vertebral artery dissection.  - Echocardiogram ordered and pending-- Added bubble study   - If echocardiogram showed reduced EF, wall motion abnormalities, will need further ischemic evaluation.  - On IV heparin for vertebral artery dissection  - Undergoing hypercoagulable workup per nephrology given stroke at young age  -Echo pending.  Otherwise per primary  - Acute Ischemic Strokes in Left Cerebellum and Frontal Lobe -- may need loop recorder vs cardiac monitor  - Left Vertebral Artery Dissection    Risk Assessment/Risk Scores:   TIMI Risk Score for Unstable Angina or Non-ST Elevation MI:   The patient's TIMI risk score is 1, which indicates a 5% risk of all cause mortality, new or recurrent myocardial infarction or need for urgent revascularization in the next 14 days.{       For questions or updates, please contact [facility] Please consult [email or web address] for contact info under    Signed, Jonita Albee, PA-C  04/19/2022 12:17 PM  Patient seen and examined with KJ PA.  Agree as above, with the following exceptions and changes as noted below.  Theresa [name] is a 31 year old female with polysubstance abuse who presents with stroke and elevated troponin for which we are consulted.  She did have chest pain on presentation and has had concerning sounding syncope and near syncope over the last several days.  On presentation to the ED she was noted to have brain MRI with patchy multifocal acute to early subacute ischemic infarcts involving the left frontal lobe and left cerebellum she was also noted to have possible left vertebral artery dissection/occlusion.  Chest discomfort symptoms  described as a pressure felt like her chest was caving in associated with shortness of breath.  Echocardiogram performed earlier today, I reviewed the preliminary findings with no cardioembolic source identified and negative bubble study.  Grossly preserved biventricular function. Gen: NAD, CV: RRR, no murmurs, Lungs: clear, Abd: soft, Extrem: Warm, well perfused, no edema, Neuro/Psych: alert and oriented x 3, normal mood and affect. All available labs, radiology testing, previous records reviewed.  I discussed with the patient that thus far we have not identified a cardioembolic source and she remains in sinus rhythm.  We discussed that at her young age it may be worthwhile to pursue a thorough investigation  for cardioembolic source of stroke and further evaluation of her troponin.  She is currently chest pain-free but continues to have a headache.  Since she is so young we can try to restratify her troponin with a coronary CT tomorrow.  Her troponin is relatively elevated and if there is any concern on CT I would recommend cardiac catheterization but we will attempt a more noninvasive approach to begin with.  She would also benefit from a transesophageal echocardiogram to completely exclude a cardiac source of stroke, we will arrange this for tomorrow at 2 PM.  We will attempt to get both studies accomplished tomorrow for completeness of her work-up.  Though I cannot otherwise explain her chest pain, her troponin may likely be secondary to stroke.  We will pursue a thorough work-up.  No ischemic findings on ECG.  CT angio chest reviewed, no definite congenital heart disease, grossly normal coronary origins and course, no obvious LV perfusion defects though contrast timing is not optimal for this assessment.  We will await coronary CT tomorrow.  Parke Poisson, MD 04/19/22 4:34 PM

## 2022-04-19 NOTE — TOC CAGE-AID Note (Signed)
Transition of Care [facility]) - CAGE-AID Screening   Patient Details  Name: Theresa [name] MRN: [id number] Date of Birth: [birth year]  [facility]) CM/SW Contact:    [name], LCSWA Phone Number: 04/19/2022, 1:33 PM   Clinical Narrative: Pt is unable to participate in Cage Aid. Pt is currently absent from room.  CSW will attempt to assess at a better time.  [name], MSW, LCSW-A Pronouns:  She/Her/Hers [facility] Clinical Social Worker Direct Number:  [phone] [email or web address]  CAGE-AID Screening: Substance Abuse Screening unable to be completed due to: : Patient unable to participate

## 2022-04-19 NOTE — Assessment & Plan Note (Signed)
Encourage cessation. °

## 2022-04-19 NOTE — Consult Note (Signed)
Neurology Consultation  Theresa [name] MR# [id number] 04/19/2022    CC: headache, slurred speech and syncopal episode  History is obtained from:patient and chart  HPI: Theresa [name] is a 31 y.o. female with a history of tobacco and marijuana abuse who presents with several days of slurred speech, headache and a syncopal episode at home.  Patient states that her speech is currently back to normal but that left sided headache continues.  Over the weekend she had a syncopal episode and passed out for about 45 minutes but states that she was able to safely lower herself to the ground when this happened.  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  Patient denies any recent neck trauma, motor vehicle accidents, sharp motion of the neck or chiropractor visits.  LDL 72, A1C 5.5.  Echocardiogram is pending.  Will order hypercoagulable workup given stroke at young age. Troponin elevated to 1,059, peak was 1,421.  Patient had an episode of chest pain several days ago.  ECG shows no ST changes.  Cardiology has been consulted.   ROS: A complete ROS was performed and is negative except as noted in the HPI.  Past Medical History:  Diagnosis Date   Anemia    Chlamydia    Depression    Eczema    Gonorrhea    Pyelonephritis    X 2     Family History  Problem Relation Age of Onset   Diabetes Mother    Asthma Daughter    Mayford Knife syndrome Son    Birth defects Neg Hx    Stroke Neg Hx    Hypertension Neg Hx    Anesthesia problems Neg Hx    Hypotension Neg Hx    Malignant hyperthermia Neg Hx    Pseudochol deficiency Neg Hx      Social History:  reports that she has been smoking cigars. She quit smokeless tobacco use about 2 years ago. She reports that she does not currently use alcohol. She reports that she does not currently use drugs after having used the following drugs:  Marijuana.   Prior to Admission medications   Medication Sig Start Date End Date Taking? Authorizing Provider  metroNIDAZOLE (FLAGYL) 500 MG tablet Take 1 tablet (500 mg total) by mouth 2 (two) times daily. Patient not taking: Reported on 04/19/2022 01/26/22   Warner Mccreedy, MD  nitrofurantoin, macrocrystal-monohydrate, (MACROBID) 100 MG capsule Take 1 capsule (100 mg total) by mouth 2 (two) times daily. Patient not taking: Reported on 04/19/2022 01/24/22   Warner Mccreedy, MD     Exam: Current vital signs: BP 105/81 (BP Location: Left Arm)   Pulse (!) 58   Temp 98.2 F (36.8 C) (Oral)   Resp 11   Ht 5' (1.524 m)   Wt 72.7 kg   LMP 03/30/2022 (Exact Date) Comment: negative beta HCG 04/18/22  SpO2 100%   BMI 31.29 kg/m    Physical Exam  Constitutional: Appears well-developed and well-nourished.  Psych: Affect appropriate to situation Eyes: No scleral injection HENT: No OP obstrucion Head: Normocephalic.  Cardiovascular: Normal rate and regular rhythm.  Respiratory: Effort normal, non-labored breathing Skin: WDI  Neuro: Mental Status: Patient is awake, alert, oriented to person, place, month, year, and situation. Patient is able to give a clear and coherent history. No signs of aphasia or neglect Cranial Nerves: II: Visual Fields are full. Pupils are equal, round, and  reactive to light.   III,IV, VI: EOMI without ptosis or diplopia.  V: Facial sensation is symmetric to light touch VII: Facial movement is symmetric.  VIII: hearing is intact to voice XII: tongue is midline without atrophy or fasciculations.  Motor: Tone is normal. Bulk is normal. 5/5 strength was present in all four extremities.  Sensory: Sensation is symmetric to light touch  in the arms and legs. Deep Tendon Reflexes: 2+ and symmetric in the biceps and patellae.  Plantars: Toes are downgoing bilaterally.  Cerebellar: FNF and HKS are intact bilaterally   I have reviewed labs in epic and the pertinent  results are:  Lab Results  Component Value Date/Time   CHOL 127 04/19/2022 03:10 AM    Results for orders placed or performed during the hospital encounter of 04/18/22 (from the past 48 hour(s))  CBC with Differential     Status: Abnormal   Collection Time: 04/18/22  9:31 PM  Result Value Ref Range   WBC 13.0 (H) 4.0 - 10.5 K/uL   RBC 4.94 3.87 - 5.11 MIL/uL   Hemoglobin 14.7 12.0 - 15.0 g/dL   HCT 16.144.2 09.636.0 - 04.546.0 %   MCV 89.5 80.0 - 100.0 fL   MCH 29.8 26.0 - 34.0 pg   MCHC 33.3 30.0 - 36.0 g/dL   RDW 40.913.3 81.111.5 - 91.415.5 %   Platelets 223 150 - 400 K/uL   nRBC 0.0 0.0 - 0.2 %   Neutrophils Relative % 55 %   Neutro Abs 7.1 1.7 - 7.7 K/uL   Lymphocytes Relative 38 %   Lymphs Abs 5.0 (H) 0.7 - 4.0 K/uL   Monocytes Relative 5 %   Monocytes Absolute 0.6 0.1 - 1.0 K/uL   Eosinophils Relative 2 %   Eosinophils Absolute 0.2 0.0 - 0.5 K/uL   Basophils Relative 0 %   Basophils Absolute 0.0 0.0 - 0.1 K/uL   Immature Granulocytes 0 %   Abs Immature Granulocytes 0.03 0.00 - 0.07 K/uL    Comment: Performed at [facility], [address] [place], [place] [id number]  Troponin I (High Sensitivity)     Status: Abnormal   Collection Time: 04/18/22  9:31 PM  Result Value Ref Range   Troponin I (High Sensitivity) 1,421 (HH) <18 ng/L    Comment: CRITICAL RESULT CALLED TO, READ BACK BY AND VERIFIED WITH:  KATIE LIVINGSTON EMTP 04/18/22 @ 2311 VS (NOTE) Elevated high sensitivity troponin I (hsTnI) values and significant  changes across serial measurements may suggest ACS but many other  chronic and acute conditions are known to elevate hsTnI results.  Refer to the Links section for chest pain algorithms and additional  guidance. Performed at [facility], [address] [place], [place] [address]   Comprehensive metabolic panel     Status: Abnormal   Collection Time: 04/18/22  9:31 PM  Result Value Ref Range   Sodium 139 135 - 145 mmol/L   Potassium  3.9 3.5 - 5.1 mmol/L   Chloride 110 98 - 111 mmol/L   CO2 23 22 - 32 mmol/L   Glucose, Bld 86 70 - 99 mg/dL    Comment: Glucose reference range applies only to samples taken after fasting for at least 8 hours.   BUN 8 6 - 20 mg/dL   Creatinine, Ser 8.650.83 0.44 - 1.00 mg/dL   Calcium 9.2 8.9 - 78.410.3 mg/dL   Total Protein 8.5 (H) 6.5 - 8.1 g/dL   Albumin 4.4 3.5 - 5.0  g/dL   AST 26 15 - 41 U/L   ALT 18 0 - 44 U/L   Alkaline Phosphatase 71 38 - 126 U/L   Total Bilirubin 0.5 0.3 - 1.2 mg/dL   GFR, Estimated >93 >23 mL/min    Comment: (NOTE) Calculated using the CKD-EPI Creatinine Equation (2021)    Anion gap 6 5 - 15    Comment: Performed at [facility], [address] [place], [place] [address]  Magnesium     Status: None   Collection Time: 04/18/22  9:31 PM  Result Value Ref Range   Magnesium 2.2 1.7 - 2.4 mg/dL    Comment: Performed at [facility], [address] [place], [place] [address]  I-Stat beta hCG blood, ED     Status: None   Collection Time: 04/18/22 10:17 PM  Result Value Ref Range   I-stat hCG, quantitative <5.0 <5 mIU/mL   Comment 3            Comment:   GEST. AGE      CONC.  (mIU/mL)   <=1 WEEK        5 - 50     2 WEEKS       50 - 500     3 WEEKS       100 - 10,000     4 WEEKS     1,000 - 30,000        FEMALE AND NON-PREGNANT FEMALE:     LESS THAN 5 mIU/mL   CBG monitoring, ED     Status: None   Collection Time: 04/18/22 10:33 PM  Result Value Ref Range   Glucose-Capillary 88 70 - 99 mg/dL    Comment: Glucose reference range applies only to samples taken after fasting for at least 8 hours.  Troponin I (High Sensitivity)     Status: Abnormal   Collection Time: 04/18/22 11:22 PM  Result Value Ref Range   Troponin I (High Sensitivity) 1,382 (HH) <18 ng/L    Comment: CRITICAL VALUE NOTED.  VALUE IS CONSISTENT WITH PREVIOUSLY REPORTED AND CALLED VALUE. (NOTE) Elevated high sensitivity troponin I (hsTnI) values and  significant  changes across serial measurements may suggest ACS but many other  chronic and acute conditions are known to elevate hsTnI results.  Refer to the Links section for chest pain algorithms and additional  guidance. Performed at [facility], [address], [place], [place] [address]   CK     Status: None   Collection Time: 04/19/22 12:00 AM  Result Value Ref Range   Total CK 160 38 - 234 U/L    Comment: Performed at [facility], [address], [place], [place] [address]  Phosphorus     Status: None   Collection Time: 04/19/22 12:00 AM  Result Value Ref Range   Phosphorus 2.8 2.5 - 4.6 mg/dL    Comment: Performed at [facility], [address], [place], [place] [address]  Sedimentation rate     Status: Abnormal   Collection Time: 04/19/22 12:00 AM  Result Value Ref Range   Sed Rate 26 (H) 0 - 22 mm/hr    Comment: Performed at [facility], [address] [place], [place] [address]  Brain natriuretic peptide     Status: None   Collection Time: 04/19/22 12:00 AM  Result Value Ref Range   B Natriuretic Peptide 23.9 0.0 - 100.0 pg/mL    Comment: Performed at  [facility], [address], [place], [place] [address]  SARS Coronavirus 2 by RT PCR (hospital order, performed in [facility] hospital lab) *cepheid single result test* Anterior Nasal Swab     Status: None   Collection Time: 04/19/22 12:08 AM   Specimen: Anterior Nasal Swab  Result Value Ref Range   SARS Coronavirus 2 by RT PCR NEGATIVE NEGATIVE    Comment: (NOTE) SARS-CoV-2 target nucleic acids are NOT DETECTED.  The SARS-CoV-2 RNA is generally detectable in upper and lower respiratory specimens during the acute phase of infection. The lowest concentration of SARS-CoV-2 viral copies this assay can detect is 250 copies / mL. A negative result does not preclude SARS-CoV-2 infection and should not be used as the sole  basis for treatment or other patient management decisions.  A negative result may occur with improper specimen collection / handling, submission of specimen other than nasopharyngeal swab, presence of viral mutation(s) within the areas targeted by this assay, and inadequate number of viral copies (<250 copies / mL). A negative result must be combined with clinical observations, patient history, and epidemiological information.  Fact Sheet for Patients:   RoadLapTop.co.za  Fact Sheet for Healthcare Providers: http://kim-miller.com/  This test is not yet approved or  cleared by the [place] FDA and has been authorized for detection and/or diagnosis of SARS-CoV-2 by FDA under an Emergency Use Authorization (EUA).  This EUA will remain in effect (meaning this test can be used) for the duration of the COVID-19 declaration under Section 564(b)(1) of the Act, 21 U.S.C. section 360bbb-3(b)(1), unless the authorization is terminated or revoked sooner.  Performed at [facility], [address] [place], [place] [address]   Urine rapid drug screen (hosp performed)     Status: Abnormal   Collection Time: 04/19/22  1:51 AM  Result Value Ref Range   Opiates NONE DETECTED NONE DETECTED   Cocaine NONE DETECTED NONE DETECTED   Benzodiazepines NONE DETECTED NONE DETECTED   Amphetamines NONE DETECTED NONE DETECTED   Tetrahydrocannabinol POSITIVE (A) NONE DETECTED   Barbiturates NONE DETECTED NONE DETECTED    Comment: (NOTE) DRUG SCREEN FOR MEDICAL PURPOSES ONLY.  IF CONFIRMATION IS NEEDED FOR ANY PURPOSE, NOTIFY LAB WITHIN 5 DAYS.  LOWEST DETECTABLE LIMITS FOR URINE DRUG SCREEN Drug Class                     Cutoff (ng/mL) Amphetamine and metabolites    1000 Barbiturate and metabolites    200 Benzodiazepine                 200 Tricyclics and metabolites     300 Opiates and metabolites        300 Cocaine and metabolites         300 THC                            50 Performed at [facility], [address] [place], [place] [address]   Urinalysis, Complete w Microscopic Urine, Clean Catch     Status: Abnormal   Collection Time: 04/19/22  1:51 AM  Result Value Ref Range   Color, Urine YELLOW YELLOW   APPearance CLEAR CLEAR   Specific Gravity, Urine 1.017 1.005 - 1.030   pH 7.0 5.0 - 8.0   Glucose, UA NEGATIVE NEGATIVE mg/dL   Hgb urine dipstick NEGATIVE NEGATIVE   Bilirubin Urine NEGATIVE NEGATIVE  Ketones, ur 5 (A) NEGATIVE mg/dL   Protein, ur NEGATIVE NEGATIVE mg/dL   Nitrite NEGATIVE NEGATIVE   Leukocytes,Ua NEGATIVE NEGATIVE   RBC / HPF 0-5 0 - 5 RBC/hpf   WBC, UA 0-5 0 - 5 WBC/hpf   Bacteria, UA NONE SEEN NONE SEEN   Squamous Epithelial / LPF 0-5 0 - 5   Mucus PRESENT     Comment: Performed at [facility], [address], [place], [place] [address]  TSH     Status: None   Collection Time: 04/19/22  3:10 AM  Result Value Ref Range   TSH 1.856 0.350 - 4.500 uIU/mL    Comment: Performed by a 3rd Generation assay with a functional sensitivity of <=0.01 uIU/mL. Performed at [facility], [address], [place], [place] [address]   C-reactive protein     Status: None   Collection Time: 04/19/22  3:10 AM  Result Value Ref Range   CRP <0.5 <1.0 mg/dL    Comment: Performed at [facility], [address], [place], [place] [address]  CBC     Status: Abnormal   Collection Time: 04/19/22  3:10 AM  Result Value Ref Range   WBC 14.1 (H) 4.0 - 10.5 K/uL   RBC 4.31 3.87 - 5.11 MIL/uL   Hemoglobin 13.2 12.0 - 15.0 g/dL   HCT 21.3 08.6 - 57.8 %   MCV 89.8 80.0 - 100.0 fL   MCH 30.6 26.0 - 34.0 pg   MCHC 34.1 30.0 - 36.0 g/dL   RDW 46.9 62.9 - 52.8 %   Platelets 188 150 - 400 K/uL   nRBC 0.0 0.0 - 0.2 %    Comment: Performed at [facility], [address], [place], [place] [address]  Lipid panel     Status:  Abnormal   Collection Time: 04/19/22  3:10 AM  Result Value Ref Range   Cholesterol 127 0 - 200 mg/dL   Triglycerides 89 <401 mg/dL   HDL 37 (L) >02 mg/dL   Total CHOL/HDL Ratio 3.4 RATIO   VLDL 18 0 - 40 mg/dL   LDL Cholesterol 72 0 - 99 mg/dL    Comment:        Total Cholesterol/HDL:CHD Risk Coronary Heart Disease Risk Table                     Men   Women  1/2 Average Risk   3.4   3.3  Average Risk       5.0   4.4  2 X Average Risk   9.6   7.1  3 X Average Risk  23.4   11.0        Use the calculated Patient Ratio above and the CHD Risk Table to determine the patient's CHD Risk.        ATP III CLASSIFICATION (LDL):  <100     mg/dL   Optimal  725-366  mg/dL   Near or Above                    Optimal  130-159  mg/dL   Borderline  440-347  mg/dL   High  >425     mg/dL   Very High Performed at [facility], [address], [place], [place] [address]   Hemoglobin A1c     Status: None   Collection Time: 04/19/22  3:10 AM  Result Value Ref Range   Hgb A1c  MFr Bld 5.5 4.8 - 5.6 %    Comment: (NOTE) Pre diabetes:          5.7%-6.4%  Diabetes:              >6.4%  Glycemic control for   <7.0% adults with diabetes    Mean Plasma Glucose 111.15 mg/dL    Comment: Performed at [facility], [address], [place], [place] [address]  Troponin I (High Sensitivity)     Status: Abnormal   Collection Time: 04/19/22  3:10 AM  Result Value Ref Range   Troponin I (High Sensitivity) 1,028 (HH) <18 ng/L    Comment: CRITICAL VALUE NOTED.  VALUE IS CONSISTENT WITH PREVIOUSLY REPORTED AND CALLED VALUE. (NOTE) Elevated high sensitivity troponin I (hsTnI) values and significant  changes across serial measurements may suggest ACS but many other  chronic and acute conditions are known to elevate hsTnI results.  Refer to the Links section for chest pain algorithms and additional  guidance. Performed at [facility], [address], [place], [place] [address]   Troponin I (High Sensitivity)     Status: Abnormal   Collection Time: 04/19/22  5:45 AM  Result Value Ref Range   Troponin I (High Sensitivity) 1,059 (HH) <18 ng/L    Comment: DELTA CHECK NOTED CRITICAL RESULT CALLED TO, READ BACK BY AND VERIFIED WITH: SMITH,J. RN AT 0630 04/19/22 MULLINS,T (NOTE) Elevated high sensitivity troponin I (hsTnI) values and significant  changes across serial measurements may suggest ACS but many other  chronic and acute conditions are known to elevate hsTnI results.  Refer to the Links section for chest pain algorithms and additional  guidance. Performed at [facility], [address], [place], [place] [address]   Comprehensive metabolic panel     Status: None   Collection Time: 04/19/22  5:45 AM  Result Value Ref Range   Sodium 137 135 - 145 mmol/L   Potassium 3.7 3.5 - 5.1 mmol/L   Chloride 110 98 - 111 mmol/L   CO2 22 22 - 32 mmol/L   Glucose, Bld 90 70 - 99 mg/dL    Comment: Glucose reference range applies only to samples taken after fasting for at least 8 hours.   BUN 6 6 - 20 mg/dL   Creatinine, Ser 8.85 0.44 - 1.00 mg/dL   Calcium 8.9 8.9 - 02.7 mg/dL   Total Protein 6.8 6.5 - 8.1 g/dL   Albumin 3.5 3.5 - 5.0 g/dL   AST 18 15 - 41 U/L   ALT 14 0 - 44 U/L   Alkaline Phosphatase 58 38 - 126 U/L   Total Bilirubin 0.7 0.3 - 1.2 mg/dL   GFR, Estimated >74 >12 mL/min    Comment: (NOTE) Calculated using the CKD-EPI Creatinine Equation (2021)    Anion gap 5 5 - 15    Comment: Performed at [facility], [address], [place], [place] [address]    CT ANGIO HEAD W OR WO CONTRAST  Result Date: 04/19/2022 CLINICAL DATA:  Follow-up examination for acute stroke. EXAM: CT ANGIOGRAPHY HEAD AND NECK TECHNIQUE: Multidetector CT imaging of the head and neck was performed using the standard protocol during bolus administration of intravenous contrast. Multiplanar CT image  reconstructions and MIPs were obtained to evaluate the vascular anatomy. Carotid stenosis measurements (when applicable) are obtained utilizing NASCET criteria, using the distal internal carotid diameter as the denominator. RADIATION DOSE REDUCTION: This exam was performed according to the  departmental dose-optimization program which includes automated exposure control, adjustment of the mA and/or kV according to patient size and/or use of iterative reconstruction technique. CONTRAST:  60mL OMNIPAQUE IOHEXOL 350 MG/ML SOLN COMPARISON:  Prior MRI from earlier the same day and CT from 04/18/2022. FINDINGS: CTA NECK FINDINGS Aortic arch: Visualized aortic arch normal in caliber with standard 3 vessel morphology. No stenosis or other abnormality about the origin the great vessels. Right carotid system: Right common and internal carotid arteries widely patent without stenosis, dissection or occlusion. Left carotid system: Left common and internal carotid arteries widely patent without stenosis, dissection or occlusion. Vertebral arteries: Both vertebral arteries arise from the subclavian arteries. No proximal subclavian artery stenosis. Right vertebral artery widely patent within the neck without stenosis or other abnormality. Left vertebral artery patent at its origin, but becomes it recently attenuated as it courses superiorly within the neck. The left vertebral artery occludes by the mid-distal left V2 segment, and remains occluded to the skull base. Skeleton: No discrete or worrisome osseous lesions. Other neck: No other acute soft tissue abnormality within the neck. Upper chest: Visualized upper chest demonstrates no acute finding. Paraseptal emphysematous changes present at the lung apices. Review of the MIP images confirms the above findings CTA HEAD FINDINGS Anterior circulation: Both internal carotid arteries patent to the termini without stenosis. A1 segments patent. Normal anterior communicating complex.  Anterior cerebral arteries widely patent. No M1 stenosis or occlusion right no visible proximal MCA branch occlusion. Distal MCA branches perfused and symmetric. Posterior circulation: Right vertebral artery widely patent to the vertebrobasilar junction. Right PICA faintly visible and is patent at its origin. Left vertebral artery occluded at the skull base. Minimal retrograde filling of the distal left V4 segment at the vertebrobasilar junction. Left V4 segment otherwise occluded. Left PICA not well seen. Basilar patent to its distal aspect without stenosis. Superior cerebellar and posterior cerebral arteries patent bilaterally. Venous sinuses: Patent allowing for timing the contrast bolus. Anatomic variants: None significant.  No aneurysm. Review of the MIP images confirms the above findings IMPRESSION: 1. Increasing attenuation within the proximal left vertebral artery within the neck, with subsequent occlusion by the V2 segment. Left vertebral artery remains largely occluded distally to the vertebrobasilar junction. Finding is presumably acute given the presence of the acute left cerebellar infarcts, possibly reflecting a dissection. 2. Otherwise normal CTA of the head and neck. No other large vessel occlusion. No other hemodynamically significant or correctable stenosis. 3.  Emphysema (ICD10-J43.9). Electronically Signed   By: Rise Mu M.D.   On: 04/19/2022 03:13   CT ANGIO NECK W OR WO CONTRAST  Result Date: 04/19/2022 CLINICAL DATA:  Follow-up examination for acute stroke. EXAM: CT ANGIOGRAPHY HEAD AND NECK TECHNIQUE: Multidetector CT imaging of the head and neck was performed using the standard protocol during bolus administration of intravenous contrast. Multiplanar CT image reconstructions and MIPs were obtained to evaluate the vascular anatomy. Carotid stenosis measurements (when applicable) are obtained utilizing NASCET criteria, using the distal internal carotid diameter as the  denominator. RADIATION DOSE REDUCTION: This exam was performed according to the departmental dose-optimization program which includes automated exposure control, adjustment of the mA and/or kV according to patient size and/or use of iterative reconstruction technique. CONTRAST:  60mL OMNIPAQUE IOHEXOL 350 MG/ML SOLN COMPARISON:  Prior MRI from earlier the same day and CT from 04/18/2022. FINDINGS: CTA NECK FINDINGS Aortic arch: Visualized aortic arch normal in caliber with standard 3 vessel morphology. No stenosis or other abnormality about the  origin the great vessels. Right carotid system: Right common and internal carotid arteries widely patent without stenosis, dissection or occlusion. Left carotid system: Left common and internal carotid arteries widely patent without stenosis, dissection or occlusion. Vertebral arteries: Both vertebral arteries arise from the subclavian arteries. No proximal subclavian artery stenosis. Right vertebral artery widely patent within the neck without stenosis or other abnormality. Left vertebral artery patent at its origin, but becomes it recently attenuated as it courses superiorly within the neck. The left vertebral artery occludes by the mid-distal left V2 segment, and remains occluded to the skull base. Skeleton: No discrete or worrisome osseous lesions. Other neck: No other acute soft tissue abnormality within the neck. Upper chest: Visualized upper chest demonstrates no acute finding. Paraseptal emphysematous changes present at the lung apices. Review of the MIP images confirms the above findings CTA HEAD FINDINGS Anterior circulation: Both internal carotid arteries patent to the termini without stenosis. A1 segments patent. Normal anterior communicating complex. Anterior cerebral arteries widely patent. No M1 stenosis or occlusion right no visible proximal MCA branch occlusion. Distal MCA branches perfused and symmetric. Posterior circulation: Right vertebral artery widely  patent to the vertebrobasilar junction. Right PICA faintly visible and is patent at its origin. Left vertebral artery occluded at the skull base. Minimal retrograde filling of the distal left V4 segment at the vertebrobasilar junction. Left V4 segment otherwise occluded. Left PICA not well seen. Basilar patent to its distal aspect without stenosis. Superior cerebellar and posterior cerebral arteries patent bilaterally. Venous sinuses: Patent allowing for timing the contrast bolus. Anatomic variants: None significant.  No aneurysm. Review of the MIP images confirms the above findings IMPRESSION: 1. Increasing attenuation within the proximal left vertebral artery within the neck, with subsequent occlusion by the V2 segment. Left vertebral artery remains largely occluded distally to the vertebrobasilar junction. Finding is presumably acute given the presence of the acute left cerebellar infarcts, possibly reflecting a dissection. 2. Otherwise normal CTA of the head and neck. No other large vessel occlusion. No other hemodynamically significant or correctable stenosis. 3.  Emphysema (ICD10-J43.9). Electronically Signed   By: Rise Mu M.D.   On: 04/19/2022 03:13   CT Angio Chest PE W and/or Wo Contrast  Result Date: 04/19/2022 CLINICAL DATA:  Syncope. Pulmonary embolism (PE) suspected, high prob EXAM: CT ANGIOGRAPHY CHEST WITH CONTRAST TECHNIQUE: Multidetector CT imaging of the chest was performed using the standard protocol during bolus administration of intravenous contrast. Multiplanar CT image reconstructions and MIPs were obtained to evaluate the vascular anatomy. RADIATION DOSE REDUCTION: This exam was performed according to the departmental dose-optimization program which includes automated exposure control, adjustment of the mA and/or kV according to patient size and/or use of iterative reconstruction technique. CONTRAST:  75mL OMNIPAQUE IOHEXOL 350 MG/ML SOLN COMPARISON:  None Available.  FINDINGS: Cardiovascular: No filling defects in the pulmonary arteries to suggest pulmonary emboli. Heart is normal size. Aorta is normal caliber. Mediastinum/Nodes: No mediastinal, hilar, or axillary adenopathy. Trachea and esophagus are unremarkable. Thyroid unremarkable. Lungs/Pleura: Lungs are clear. No focal airspace opacities or suspicious nodules. No effusions. Upper Abdomen: Imaging into the upper abdomen demonstrates no acute findings. Musculoskeletal: Chest wall soft tissues are unremarkable. No acute bony abnormality. Review of the MIP images confirms the above findings. IMPRESSION: No evidence of pulmonary embolus. No acute cardiopulmonary disease. Electronically Signed   By: Charlett Nose M.D.   On: 04/19/2022 02:38   MR Cervical Spine W or Wo Contrast  Result Date: 04/19/2022 CLINICAL DATA:  Initial  evaluation for multiple sclerosis. EXAM: MRI CERVICAL SPINE WITHOUT AND WITH CONTRAST TECHNIQUE: Multiplanar and multiecho pulse sequences of the cervical spine, to include the craniocervical junction and cervicothoracic junction, were obtained without and with intravenous contrast. CONTRAST:  7mL GADAVIST GADOBUTROL 1 MMOL/ML IV SOLN COMPARISON:  Comparison made with corresponding brain MRI performed at the same time. FINDINGS: Alignment: Straightening of the normal cervical lordosis. No listhesis. Vertebrae: Vertebral body height maintained without acute or chronic fracture. Bone marrow signal intensity diffusely decreased on T1 weighted sequence, nonspecific, but most commonly related to anemia, smoking or obesity. No focal marrow replacing lesion. No abnormal marrow edema or enhancement. Cord: Normal signal and morphology. No evidence for demyelinating disease. No abnormal enhancement. Posterior Fossa, vertebral arteries, paraspinal tissues: Loss of normal flow void within the left vertebral artery, which could be related to slow flow and/or occlusion. Paraspinous soft tissues otherwise unremarkable.  Disc levels: No significant disc pathology seen within the cervical spine. No focal disc herniation. No significant canal or neural foraminal stenosis or evidence for neural impingement. IMPRESSION: 1. Loss of normal flow void within the left vertebral artery, which could be related to slow flow and/or occlusion. Given the presence of the acute left cerebellar infarcts, finding raises the possibility for underlying dissection. Further evaluation with dedicated vascular imaging of the neck suggested for further evaluation. 2. Otherwise unremarkable and normal MRI of the cervical spine and spinal cord. No evidence for demyelinating disease. No significant stenosis or impingement. Electronically Signed   By: Rise Mu M.D.   On: 04/19/2022 01:22   MR Brain W and Wo Contrast  Result Date: 04/19/2022 CLINICAL DATA:  Initial evaluation for multiple sclerosis. EXAM: MRI HEAD WITHOUT AND WITH CONTRAST TECHNIQUE: Multiplanar, multiecho pulse sequences of the brain and surrounding structures were obtained without and with intravenous contrast. CONTRAST:  7mL GADAVIST GADOBUTROL 1 MMOL/ML IV SOLN COMPARISON:  Prior CT from 04/18/2022. FINDINGS: Brain: Cerebral volume within normal limits. Scattered patchy diffusion signal abnormality seen involving the cortical and subcortical aspect of the left frontal operculum and subinsular region. Additional patchy diffusion abnormality present at the inferior left cerebellum. Findings consistent with evolving acute to early subacute ischemic infarcts. No associated hemorrhage or significant regional mass effect. No other focal parenchymal signal abnormality. No significant underlying cerebral white matter disease. No evidence for demyelinating disease. No acute or chronic intracranial blood products. No mass lesion, mass effect, or midline shift. No hydrocephalus or extra-axial fluid collection. Pituitary gland suprasellar region normal. No abnormal enhancement. Vascular:  Major intracranial vascular flow voids are grossly maintained. Skull and upper cervical spine: Craniocervical junction within normal limits. Bone marrow signal intensity diffusely decreased on T1 weighted sequence, nonspecific, but most commonly related to anemia, smoking or obesity. No focal marrow replacing lesion. No scalp soft tissue abnormality. Sinuses/Orbits: Globes and orbital soft tissues within normal limits. Paranasal sinuses are clear. No mastoid effusion. Other: None. IMPRESSION: 1. 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 No associated hemorrhage or significant regional mass effect. 2. Otherwise normal brain MRI. No evidence for demyelinating disease. Electronically Signed   By: Rise Mu M.D.   On: 04/19/2022 01:15   DG Chest 2 View  Result Date: 04/18/2022 CLINICAL DATA:  Chest pain syncope EXAM: CHEST - 2 VIEW COMPARISON:  None Available. FINDINGS: The heart size and mediastinal contours are within normal limits. Both lungs are clear. The visualized skeletal structures are unremarkable. IMPRESSION: No active cardiopulmonary disease. Electronically  Signed   By: Jasmine Pang M.D.   On: 04/18/2022 22:07   CT Head Wo Contrast  Result Date: 04/18/2022 CLINICAL DATA:  Headache and bilateral hand tingling EXAM: CT HEAD WITHOUT CONTRAST TECHNIQUE: Contiguous axial images were obtained from the base of the skull through the vertex without intravenous contrast. RADIATION DOSE REDUCTION: This exam was performed according to the departmental dose-optimization program which includes automated exposure control, adjustment of the mA and/or kV according to patient size and/or use of iterative reconstruction technique. COMPARISON:  None Available. FINDINGS: Brain: No hemorrhage or intracranial mass. Hypodensity within the left cerebellum. Suspicion of subtle hypodensity within the left basal ganglia and potentially the  subcortical white matter of the left frontal lobe. No midline shift. The ventricles are nonenlarged Vascular: No hyperdense vessels.  No unexpected calcification Skull: Normal. Negative for fracture or focal lesion. Sinuses/Orbits: No acute finding. Other: None IMPRESSION: 1. Multifocal age indeterminate hypodensities within the left cerebellum, left basal ganglia and potentially left subcortical white matter, question ischemic foci. Recommend further evaluation with MRI. 2. Negative for hemorrhage mass or mass effect. Electronically Signed   By: Jasmine Pang M.D.   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  Patient reports no history of neck trauma or sharp motion of neck, so reason for vertebral dissection is unknown.  Neurological exam reveals no current deficits, although patient had several days of slurred speech.  UDS was positive for THC only.  Appreciate cardiology recommendations for treatment of elevated troponin.    Recommendations: Continue IV heparin for left vertebral dissection Continue stroke workup with 2D echocardiogram, lower extremity ultrasound  Hypercoagulablity panel for workup of stroke in young patient Consider 30 day cardiac monitor vs. Loop recorder    Keni Elison E Ernestina Columbia , MSN, AGACNP-BC [facility] See Amion for schedule and pager  information 04/19/2022 10:59 AM

## 2022-04-19 NOTE — Plan of Care (Signed)
Pt doing well and the cocktail for her HA resolved. Pt had a lot of visitors and she was emotional about all that was happening to her. RN reassured her.  Problem: Education: Goal: Knowledge of General Education information will improve Description: Including pain rating scale, medication(s)/side effects and non-pharmacologic comfort measures Outcome: Progressing   Problem: Health Behavior/Discharge Planning: Goal: Ability to manage health-related needs will improve Outcome: Progressing   Problem: Clinical Measurements: Goal: Ability to maintain clinical measurements within normal limits will improve Outcome: Progressing Goal: Will remain free from infection Outcome: Progressing Goal: Diagnostic test results will improve Outcome: Progressing Goal: Respiratory complications will improve Outcome: Progressing Goal: Cardiovascular complication will be avoided Outcome: Progressing   Problem: Activity: Goal: Risk for activity intolerance will decrease Outcome: Progressing   Problem: Nutrition: Goal: Adequate nutrition will be maintained Outcome: Progressing   Problem: Coping: Goal: Level of anxiety will decrease Outcome: Progressing   Problem: Elimination: Goal: Will not experience complications related to bowel motility Outcome: Progressing Goal: Will not experience complications related to urinary retention Outcome: Progressing   Problem: Pain Managment: Goal: General experience of comfort will improve Outcome: Progressing   Problem: Safety: Goal: Ability to remain free from injury will improve Outcome: Progressing   Problem: Skin Integrity: Goal: Risk for impaired skin integrity will decrease Outcome: Progressing   Problem: Education: Goal: Knowledge of disease or condition will improve Outcome: Progressing Goal: Knowledge of secondary prevention will improve (SELECT ALL) Outcome: Progressing Goal: Knowledge of patient specific risk factors will improve  (INDIVIDUALIZE FOR PATIENT) Outcome: Progressing   Problem: Ischemic Stroke/TIA Tissue Perfusion: Goal: Complications of ischemic stroke/TIA will be minimized Outcome: Progressing

## 2022-04-19 NOTE — Progress Notes (Signed)
Bilateral lower extremity venous duplex has been completed. Preliminary results can be found in CV Proc through chart review.   04/19/22 4:50 PM Olen Cordial RVT

## 2022-04-19 NOTE — ED Notes (Signed)
Called [facility] for transport of pt to [facility]. [facility] will not be avail until after 7a. This RN called [facility] to transport pt. They currently have no trucks available

## 2022-04-19 NOTE — Assessment & Plan Note (Addendum)
MRI with patchy multifocal acute to early subacute ischemic infarcts involving the L frontal lobe and L cerebellum MRI c spine within L vertebral artery which could be related to slow flow and/or occlusion CTA head/neck with increasing attenuation within proximal L vertebral artery within the neck with subsequent occlusion by the V2 segment, L vertebral artery largely occluded distally to the vertebrobasilar junction (presumably acute given presence of acute L cerebellar infarcts, possibly reflecting dissection) Neurology recommending continue IV heparin, echo, LE [place] - negative for DVT, hypercoag panel, 30 day cardiac monitor vs loop recoorder Echo with Ef 60-56%, negative agitated saline contrast bubble study -> neuro recommending TEE A1c 5.5, LDL 72

## 2022-04-19 NOTE — Progress Notes (Signed)
ANTICOAGULATION CONSULT NOTE - Follow Up Consult  Pharmacy Consult for Heparin Indication: stroke / left vertebral artery dissection  Allergies  Allergen Reactions   Latex Itching, Swelling and Other (See Comments)    Reaction:  Localized swelling    Patient Measurements: Height: 5' (152.4 cm) Weight: 72.7 kg (160 lb 3.2 oz) IBW/kg (Calculated) : 45.5 Heparin Dosing Weight: 61.6 kg  Vital Signs: Temp: 98.6 F (37 C) (06/14 1214) Temp Source: Oral (06/14 1214) BP: 117/76 (06/14 1214) Pulse Rate: 61 (06/14 1214)  Labs: Recent Labs    04/18/22 2131 04/18/22 2322 04/19/22 0000 04/19/22 0310 04/19/22 0545 04/19/22 1257  HGB 14.7  --   --  13.2  --   --   HCT 44.2  --   --  38.7  --   --   PLT 223  --   --  188  --   --   HEPARINUNFRC  --   --   --   --   --  <0.10*  CREATININE 0.83  --   --   --  0.84  --   CKTOTAL  --   --  160  --   --   --   TROPONINIHS 1,421* 1,382*  --  1,028* 1,059*  --     Estimated Creatinine Clearance: 85.6 mL/min (by C-G formula based on SCr of 0.84 mg/dL).  Assessment: 25 yoF with multiple infarcts per MRI.  Pharmacy consulted to start heparin without bolus per stroke nomogram by neurologist. Not on anticoagulation PTA.  Baseline CBC normal.  CTA shows left vertebral dissection. CTA chest negative for PE.    Initial heparin level is < 0.10, undetectable on heparin drip at 750 units/hr.   RN reports intermittent kinking of line with arm movement due to antecubital IV site.   Difficult to know how much that might be contributing to low heparin level, but suspect that it would still be low.  Patient is off the floor for echo and duplex.   Goal of Therapy:  Heparin level 0.3-0.5 units/ml Monitor platelets by anticoagulation protocol: Yes   Plan:  Increase heparin drip to 900 units/hr when back on the floor. Heparin level ~8 hrs after rate change. Daily heparin level and CBC while on heparin.  Arty Baumgartner, RPh 04/19/2022,2:40  PM

## 2022-04-19 NOTE — Discharge Summary (Signed)
Physician Discharge Summary  Theresa [name] JSE:[id number] DOB: 1991-09-02 DOA: 04/18/2022  PCP: Patient, No Pcp Per  Admit date: 04/18/2022 Discharge date: 04/19/2022  Time spent: less than 30 min minutes  Recommendations for Outpatient Follow-up:  Patient left against medical advice  Needs to complete workup described below and in cardiology and neurology notes   Discharge Diagnoses:  Principal Problem:   Syncope Active Problems:   Elevated troponin   CVA (cerebral vascular accident) (HCC)   Headache   Marijuana use   Obesity (BMI 30-39.9)   Tobacco abuse   Stroke [facility])   Discharge Condition: left against medical advice  Diet recommendation: left against medical advice  Filed Weights   04/18/22 1637  Weight: 72.7 kg    History of present illness:  31 yo with hx MJ abuse, tobacco abuse presenting with headaches, paresthesias, and an episode of syncope.  She was seen on June 10th, but left AMA.  She noted chest pain on Saturday and episode of LOC.  Notes HA since then.  See H&P for additional info.  Patient left AMA after my shift.  I did not receive notice she was leaving until after she'd already left and I was unable to speak with her prior to discharge.  I called both of her numbers and her grandmother's listed contacts with no response.    See prior notes for additional details  See below for additional details  Hospital Course:  Assessment and Plan: * Syncope Related to stroke? Continue tele Follow EEG Negative CT PE protocol   Elevated troponin Appreciate cardiology recommendations, possibly related to stroke? planning for coronary CT followed by cath if conconcerning Echo without RWMA CT PE protocol without PE   CVA (cerebral vascular accident) (HCC) MRI with patchy multifocal acute to early subacute ischemic infarcts involving the L frontal lobe and L cerebellum MRI c spine within L vertebral artery which could be related to slow flow and/or  occlusion CTA head/neck with increasing attenuation within proximal L vertebral artery within the neck with subsequent occlusion by the V2 segment, L vertebral artery largely occluded distally to the vertebrobasilar junction (presumably acute given presence of acute L cerebellar infarcts, possibly reflecting dissection) Neurology recommending continue IV heparin, echo, LE [place] - negative for DVT, hypercoag panel, 30 day cardiac monitor vs loop recoorder Echo with Ef 60-56%, negative agitated saline contrast bubble study -> neuro recommending TEE A1c 5.5, LDL 72  Headache Resolved after HA cocktail  Marijuana use Encourage cessation  Obesity (BMI 30-39.9) noted        Discharge Exam: Vitals:   04/19/22 1214 04/19/22 1539  BP: 117/76 (!) 105/59  Pulse: 61 (!) 58  Resp: 14 20  Temp: 98.6 F (37 C) 97.6 F (36.4 C)  SpO2:  100%   See progress noted from earlier   Discharge Instructions    Allergies as of 04/19/2022       Reactions   Latex Itching, Swelling, Other (See Comments)   Reaction:  Localized swelling           Allergies  Allergen Reactions   Latex Itching, Swelling and Other (See Comments)    Reaction:  Localized swelling      The results of significant diagnostics from this hospitalization (including imaging, microbiology, ancillary and laboratory) are listed below for reference.    Significant Diagnostic Studies: ECHOCARDIOGRAM COMPLETE BUBBLE STUDY  Result Date: 04/19/2022    ECHOCARDIOGRAM REPORT   Patient Name:   Theresa [name] Date of Exam:  04/19/2022 Medical Rec #:  [id number]      Height:       60.0 in Accession #:    [id number]     Weight:       160.2 lb Date of Birth:  [birth year]      BSA:          1.699 m Patient Age:    31 years       BP:           117/76 mmHg Patient Gender: F              HR:           56 bpm. Exam Location:  Inpatient Procedure: 2D Echo, Cardiac Doppler, Color Doppler and Saline Contrast Bubble            Study  Indications:    Stroke  History:        Patient has no prior history of Echocardiogram examinations.                 Signs/Symptoms:Fever; Risk Factors:Current Smoker.  Sonographer:    Gertie Fey MHA, RDMS, RVT, RDCS Referring Phys: [id number] ANASTASSIA DOUTOVA  Sonographer Comments: Image acquisition challenging due to respiratory motion and constant movement with wincing and breath holding. IMPRESSIONS  1. Left ventricular ejection fraction, by estimation, is 60 to 65%. The left ventricle has normal function. The left ventricle has no regional wall motion abnormalities. Left ventricular diastolic parameters were normal.  2. Right ventricular systolic function is normal. The right ventricular size is normal. There is normal pulmonary artery systolic pressure.  3. The mitral valve is normal in structure. Trivial mitral valve regurgitation.  4. Acoustic window diffiicult Unable to define number of leaflets No stenosis. . The aortic valve is grossly normal. Aortic valve regurgitation is not visualized.  5. Agitated saline contrast bubble study was negative, with no evidence of any interatrial shunt. FINDINGS  Left Ventricle: Left ventricular ejection fraction, by estimation, is 60 to 65%. The left ventricle has normal function. The left ventricle has no regional wall motion abnormalities. Definity contrast agent was given IV to delineate the left ventricular  endocardial borders. The left ventricular internal cavity size was normal in size. There is no left ventricular hypertrophy. Left ventricular diastolic parameters were normal. Right Ventricle: The right ventricular size is normal. Right vetricular wall thickness was not assessed. Right ventricular systolic function is normal. There is normal pulmonary artery systolic pressure. The tricuspid regurgitant velocity is 1.87 m/s, and with an assumed right atrial pressure of 3 mmHg, the estimated right ventricular systolic pressure is 17.0 mmHg. Left Atrium: Left  atrial size was normal in size. Right Atrium: Right atrial size was normal in size. Pericardium: There is no evidence of pericardial effusion. Mitral Valve: The mitral valve is normal in structure. Trivial mitral valve regurgitation. Tricuspid Valve: The tricuspid valve is normal in structure. Tricuspid valve regurgitation is trivial. Aortic Valve: Acoustic window diffiicult Unable to define number of leaflets No stenosis. The aortic valve is grossly normal. Aortic valve regurgitation is not visualized. Aortic valve mean gradient measures 2.0 mmHg. Aortic valve peak gradient measures 4.5 mmHg. Aortic valve area, by VTI measures 2.44 cm. Pulmonic Valve: The pulmonic valve was normal in structure. Pulmonic valve regurgitation is not visualized. Aorta: The aortic root is normal in size and structure. IAS/Shunts: No atrial level shunt detected by color flow Doppler. Agitated saline contrast was given intravenously to evaluate for intracardiac shunting. Agitated saline contrast bubble  study was negative, with no evidence of any interatrial shunt.  LEFT VENTRICLE PLAX 2D LVIDd:         4.10 cm   Diastology LVIDs:         3.20 cm   LV e' medial:    11.30 cm/s LV PW:         0.70 cm   LV E/e' medial:  6.3 LV IVS:        0.70 cm   LV e' lateral:   13.50 cm/s LVOT diam:     1.90 cm   LV E/e' lateral: 5.3 LV SV:         55 LV SV Index:   33 LVOT Area:     2.84 cm  RIGHT VENTRICLE RV S prime:     14.30 cm/s LEFT ATRIUM             Index        RIGHT ATRIUM          Index LA diam:        2.80 cm 1.65 cm/m   RA Area:     8.86 cm LA Vol (A2C):   35.0 ml 20.60 ml/m  RA Volume:   15.80 ml 9.30 ml/m LA Vol (A4C):   33.5 ml 19.72 ml/m LA Biplane Vol: 34.4 ml 20.25 ml/m  AORTIC VALVE AV Area (Vmax):    2.43 cm AV Area (Vmean):   2.17 cm AV Area (VTI):     2.44 cm AV Vmax:           106.00 cm/s AV Vmean:          74.900 cm/s AV VTI:            0.227 m AV Peak Grad:      4.5 mmHg AV Mean Grad:      2.0 mmHg LVOT Vmax:          90.70 cm/s LVOT Vmean:        57.300 cm/s LVOT VTI:          0.195 m LVOT/AV VTI ratio: 0.86  AORTA Ao Root diam: 2.75 cm MITRAL VALVE               TRICUSPID VALVE MV Area (PHT): 2.92 cm    TR Peak grad:   14.0 mmHg MV Decel Time: 260 msec    TR Vmax:        187.00 cm/s MV E velocity: 71.60 cm/s MV Josanna Hefel velocity: 57.40 cm/s  SHUNTS MV E/Tykiera Raven ratio:  1.25        Systemic VTI:  0.20 m                            Systemic Diam: 1.90 cm Dietrich Pates MD Electronically signed by Dietrich Pates MD Signature Date/Time: 04/19/2022/6:26:08 PM    Final    VAS [place] LOWER EXTREMITY VENOUS (DVT)  Result Date: 04/19/2022  Lower Venous DVT Study Patient Name:  AELIANA SPATES  Date of Exam:   04/19/2022 Medical Rec #: [id number]       Accession #:    [id number] Date of Birth: 1991/03/26       Patient Gender: F Patient Age:   78 years Exam Location:  [facility] Procedure:      VAS [place] LOWER EXTREMITY VENOUS (DVT) Referring Phys: Jonny Ruiz DOUTOVA --------------------------------------------------------------------------------  Indications: Syncope.  Risk Factors: None identified. Comparison Study: No prior studies. Performing  Technologist: Chanda Busing RVT  Examination Guidelines: Azyriah Nevins complete evaluation includes B-mode imaging, spectral Doppler, color Doppler, and power Doppler as needed of all accessible portions of each vessel. Bilateral testing is considered an integral part of Lavella Myren complete examination. Limited examinations for reoccurring indications may be performed as noted. The reflux portion of the exam is performed with the patient in reverse Trendelenburg.  +---------+---------------+---------+-----------+----------+--------------+ RIGHT    CompressibilityPhasicitySpontaneityPropertiesThrombus Aging +---------+---------------+---------+-----------+----------+--------------+ CFV      Full           Yes      Yes                                  +---------+---------------+---------+-----------+----------+--------------+ SFJ      Full                                                        +---------+---------------+---------+-----------+----------+--------------+ FV Prox  Full                                                        +---------+---------------+---------+-----------+----------+--------------+ FV Mid   Full                                                        +---------+---------------+---------+-----------+----------+--------------+ FV DistalFull                                                        +---------+---------------+---------+-----------+----------+--------------+ PFV      Full                                                        +---------+---------------+---------+-----------+----------+--------------+ POP      Full           Yes      Yes                                 +---------+---------------+---------+-----------+----------+--------------+ PTV      Full                                                        +---------+---------------+---------+-----------+----------+--------------+ PERO     Full                                                        +---------+---------------+---------+-----------+----------+--------------+   +---------+---------------+---------+-----------+----------+--------------+  LEFT     CompressibilityPhasicitySpontaneityPropertiesThrombus Aging +---------+---------------+---------+-----------+----------+--------------+ CFV      Full           Yes      Yes                                 +---------+---------------+---------+-----------+----------+--------------+ SFJ      Full                                                        +---------+---------------+---------+-----------+----------+--------------+ FV Prox  Full                                                         +---------+---------------+---------+-----------+----------+--------------+ FV Mid   Full                                                        +---------+---------------+---------+-----------+----------+--------------+ FV DistalFull                                                        +---------+---------------+---------+-----------+----------+--------------+ PFV      Full                                                        +---------+---------------+---------+-----------+----------+--------------+ POP      Full           Yes      Yes                                 +---------+---------------+---------+-----------+----------+--------------+ PTV      Full                                                        +---------+---------------+---------+-----------+----------+--------------+ PERO     Full                                                        +---------+---------------+---------+-----------+----------+--------------+     Summary: RIGHT: - There is no evidence of deep vein thrombosis in the lower extremity.  - No cystic structure found in the popliteal fossa.  LEFT: - There is no evidence of deep vein thrombosis in the lower extremity.  - No  cystic structure found in the popliteal fossa.  *See table(s) above for measurements and observations. Electronically signed by Heath Lark on 04/19/2022 at 6:03:57 PM.    Final    CT ANGIO HEAD W OR WO CONTRAST  Result Date: 04/19/2022 CLINICAL DATA:  Follow-up examination for acute stroke. EXAM: CT ANGIOGRAPHY HEAD AND NECK TECHNIQUE: Multidetector CT imaging of the head and neck was performed using the standard protocol during bolus administration of intravenous contrast. Multiplanar CT image reconstructions and MIPs were obtained to evaluate the vascular anatomy. Carotid stenosis measurements (when applicable) are obtained utilizing NASCET criteria, using the distal internal carotid diameter as the denominator.  RADIATION DOSE REDUCTION: This exam was performed according to the departmental dose-optimization program which includes automated exposure control, adjustment of the mA and/or kV according to patient size and/or use of iterative reconstruction technique. CONTRAST:  60mL OMNIPAQUE IOHEXOL 350 MG/ML SOLN COMPARISON:  Prior MRI from earlier the same day and CT from 04/18/2022. FINDINGS: CTA NECK FINDINGS Aortic arch: Visualized aortic arch normal in caliber with standard 3 vessel morphology. No stenosis or other abnormality about the origin the great vessels. Right carotid system: Right common and internal carotid arteries widely patent without stenosis, dissection or occlusion. Left carotid system: Left common and internal carotid arteries widely patent without stenosis, dissection or occlusion. Vertebral arteries: Both vertebral arteries arise from the subclavian arteries. No proximal subclavian artery stenosis. Right vertebral artery widely patent within the neck without stenosis or other abnormality. Left vertebral artery patent at its origin, but becomes it recently attenuated as it courses superiorly within the neck. The left vertebral artery occludes by the mid-distal left V2 segment, and remains occluded to the skull base. Skeleton: No discrete or worrisome osseous lesions. Other neck: No other acute soft tissue abnormality within the neck. Upper chest: Visualized upper chest demonstrates no acute finding. Paraseptal emphysematous changes present at the lung apices. Review of the MIP images confirms the above findings CTA HEAD FINDINGS Anterior circulation: Both internal carotid arteries patent to the termini without stenosis. A1 segments patent. Normal anterior communicating complex. Anterior cerebral arteries widely patent. No M1 stenosis or occlusion right no visible proximal MCA branch occlusion. Distal MCA branches perfused and symmetric. Posterior circulation: Right vertebral artery widely patent to the  vertebrobasilar junction. Right PICA faintly visible and is patent at its origin. Left vertebral artery occluded at the skull base. Minimal retrograde filling of the distal left V4 segment at the vertebrobasilar junction. Left V4 segment otherwise occluded. Left PICA not well seen. Basilar patent to its distal aspect without stenosis. Superior cerebellar and posterior cerebral arteries patent bilaterally. Venous sinuses: Patent allowing for timing the contrast bolus. Anatomic variants: None significant.  No aneurysm. Review of the MIP images confirms the above findings IMPRESSION: 1. Increasing attenuation within the proximal left vertebral artery within the neck, with subsequent occlusion by the V2 segment. Left vertebral artery remains largely occluded distally to the vertebrobasilar junction. 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 2. Otherwise normal CTA of the head and neck. No other large vessel occlusion. No other hemodynamically significant or correctable stenosis. 3.  Emphysema (ICD10-J43.9). Electronically Signed   By: Rise Mu M.D.   On: 04/19/2022 03:13   CT ANGIO NECK W OR WO CONTRAST  Result Date: 04/19/2022 CLINICAL DATA:  Follow-up examination for acute stroke. EXAM: CT ANGIOGRAPHY HEAD AND NECK TECHNIQUE: Multidetector CT imaging of the head and neck was performed using  the standard protocol during bolus administration of intravenous contrast. Multiplanar CT image reconstructions and MIPs were obtained to evaluate the vascular anatomy. Carotid stenosis measurements (when applicable) are obtained utilizing NASCET criteria, using the distal internal carotid diameter as the denominator. RADIATION DOSE REDUCTION: This exam was performed according to the departmental dose-optimization program which includes automated exposure control, adjustment of the mA and/or kV according to patient size and/or use of  iterative reconstruction technique. CONTRAST:  60mL OMNIPAQUE IOHEXOL 350 MG/ML SOLN COMPARISON:  Prior MRI from earlier the same day and CT from 04/18/2022. FINDINGS: CTA NECK FINDINGS Aortic arch: Visualized aortic arch normal in caliber with standard 3 vessel morphology. No stenosis or other abnormality about the origin the great vessels. Right carotid system: Right common and internal carotid arteries widely patent without stenosis, dissection or occlusion. Left carotid system: Left common and internal carotid arteries widely patent without stenosis, dissection or occlusion. Vertebral arteries: Both vertebral arteries arise from the subclavian arteries. No proximal subclavian artery stenosis. Right vertebral artery widely patent within the neck without stenosis or other abnormality. Left vertebral artery patent at its origin, but becomes it recently attenuated as it courses superiorly within the neck. The left vertebral artery occludes by the mid-distal left V2 segment, and remains occluded to the skull base. Skeleton: No discrete or worrisome osseous lesions. Other neck: No other acute soft tissue abnormality within the neck. Upper chest: Visualized upper chest demonstrates no acute finding. Paraseptal emphysematous changes present at the lung apices. Review of the MIP images confirms the above findings CTA HEAD FINDINGS Anterior circulation: Both internal carotid arteries patent to the termini without stenosis. A1 segments patent. Normal anterior communicating complex. Anterior cerebral arteries widely patent. No M1 stenosis or occlusion right no visible proximal MCA branch occlusion. Distal MCA branches perfused and symmetric. Posterior circulation: Right vertebral artery widely patent to the vertebrobasilar junction. Right PICA faintly visible and is patent at its origin. Left vertebral artery occluded at the skull base. Minimal retrograde filling of the distal left V4 segment at the vertebrobasilar  junction. Left V4 segment otherwise occluded. Left PICA not well seen. Basilar patent to its distal aspect without stenosis. Superior cerebellar and posterior cerebral arteries patent bilaterally. Venous sinuses: Patent allowing for timing the contrast bolus. Anatomic variants: None significant.  No aneurysm. Review of the MIP images confirms the above findings IMPRESSION: 1. Increasing attenuation within the proximal left vertebral artery within the neck, with subsequent occlusion by the V2 segment. Left vertebral artery remains largely occluded distally to the vertebrobasilar junction. 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 2. Otherwise normal CTA of the head and neck. No other large vessel occlusion. No other hemodynamically significant or correctable stenosis. 3.  Emphysema (ICD10-J43.9). Electronically Signed   By: Rise MuBenjamin  McClintock M.D.   On: 04/19/2022 03:13   CT Angio Chest PE W and/or Wo Contrast  Result Date: 04/19/2022 CLINICAL DATA:  Syncope. Pulmonary embolism (PE) suspected, high prob EXAM: CT ANGIOGRAPHY CHEST WITH CONTRAST TECHNIQUE: Multidetector CT imaging of the chest was performed using the standard protocol during bolus administration of intravenous contrast. Multiplanar CT image reconstructions and MIPs were obtained to evaluate the vascular anatomy. RADIATION DOSE REDUCTION: This exam was performed according to the departmental dose-optimization program which includes automated exposure control, adjustment of the mA and/or kV according to patient size and/or use of iterative reconstruction technique. CONTRAST:  75mL OMNIPAQUE IOHEXOL 350 MG/ML SOLN COMPARISON:  None Available.  FINDINGS: Cardiovascular: No filling defects in the pulmonary arteries to suggest pulmonary emboli. Heart is normal size. Aorta is normal caliber. Mediastinum/Nodes: No mediastinal, hilar, or axillary adenopathy. Trachea and esophagus  are unremarkable. Thyroid unremarkable. Lungs/Pleura: Lungs are clear. No focal airspace opacities or suspicious nodules. No effusions. Upper Abdomen: Imaging into the upper abdomen demonstrates no acute findings. Musculoskeletal: Chest wall soft tissues are unremarkable. No acute bony abnormality. Review of the MIP images confirms the above findings. IMPRESSION: No evidence of pulmonary embolus. No acute cardiopulmonary disease. Electronically Signed   By: Charlett Nose M.D.   On: 04/19/2022 02:38   MR Cervical Spine W or Wo Contrast  Result Date: 04/19/2022 CLINICAL DATA:  Initial evaluation for multiple sclerosis. EXAM: MRI CERVICAL SPINE WITHOUT AND WITH CONTRAST TECHNIQUE: Multiplanar and multiecho pulse sequences of the cervical spine, to include the craniocervical junction and cervicothoracic junction, were obtained without and with intravenous contrast. CONTRAST:  7mL GADAVIST GADOBUTROL 1 MMOL/ML IV SOLN COMPARISON:  Comparison made with corresponding brain MRI performed at the same time. FINDINGS: Alignment: Straightening of the normal cervical lordosis. No listhesis. Vertebrae: Vertebral body height maintained without acute or chronic fracture. Bone marrow signal intensity diffusely decreased on T1 weighted sequence, nonspecific, but most commonly related to anemia, smoking or obesity. No focal marrow replacing lesion. No abnormal marrow edema or enhancement. Cord: Normal signal and morphology. No evidence for demyelinating disease. No abnormal enhancement. Posterior Fossa, vertebral arteries, paraspinal tissues: Loss of normal flow void within the left vertebral artery, which could be related to slow flow and/or occlusion. Paraspinous soft tissues otherwise unremarkable. Disc levels: No significant disc pathology seen within the cervical spine. No focal disc herniation. No significant canal or neural foraminal stenosis or evidence for neural impingement. IMPRESSION: 1. Loss of normal flow void within  the left vertebral artery, which could be related to slow flow and/or occlusion. Given the presence of the acute left cerebellar infarcts, finding raises the possibility for underlying dissection. Further evaluation with dedicated vascular imaging of the neck suggested for further evaluation. 2. Otherwise unremarkable and normal MRI of the cervical spine and spinal cord. No evidence for demyelinating disease. No significant stenosis or impingement. Electronically Signed   By: Rise Mu M.D.   On: 04/19/2022 01:22   MR Brain W and Wo Contrast  Result Date: 04/19/2022 CLINICAL DATA:  Initial evaluation for multiple sclerosis. EXAM: MRI HEAD WITHOUT AND WITH CONTRAST TECHNIQUE: Multiplanar, multiecho pulse sequences of the brain and surrounding structures were obtained without and with intravenous contrast. CONTRAST:  7mL GADAVIST GADOBUTROL 1 MMOL/ML IV SOLN COMPARISON:  Prior CT from 04/18/2022. FINDINGS: Brain: Cerebral volume within normal limits. Scattered patchy diffusion signal abnormality seen involving the cortical and subcortical aspect of the left frontal operculum and subinsular region. Additional patchy diffusion abnormality present at the inferior left cerebellum. Findings consistent with evolving acute to early subacute ischemic infarcts. No associated hemorrhage or significant regional mass effect. No other focal parenchymal signal abnormality. No significant underlying cerebral white matter disease. No evidence for demyelinating disease. No acute or chronic intracranial blood products. No mass lesion, mass effect, or midline shift. No hydrocephalus or extra-axial fluid collection. Pituitary gland suprasellar region normal. No abnormal enhancement. Vascular: Major intracranial vascular flow voids are grossly maintained. Skull and upper cervical spine: Craniocervical junction within normal limits. Bone marrow signal intensity diffusely decreased on T1 weighted sequence, nonspecific, but  most commonly related to anemia, smoking or obesity. No focal marrow replacing lesion. No scalp soft tissue  abnormality. Sinuses/Orbits: Globes and orbital soft tissues within normal limits. Paranasal sinuses are clear. No mastoid effusion. Other: None. IMPRESSION: 1. 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 No associated hemorrhage or significant regional mass effect. 2. Otherwise normal brain MRI. No evidence for demyelinating disease. Electronically Signed   By: Rise Mu M.D.   On: 04/19/2022 01:15   DG Chest 2 View  Result Date: 04/18/2022 CLINICAL DATA:  Chest pain syncope EXAM: CHEST - 2 VIEW COMPARISON:  None Available. FINDINGS: The heart size and mediastinal contours are within normal limits. Both lungs are clear. The visualized skeletal structures are unremarkable. IMPRESSION: No active cardiopulmonary disease. Electronically Signed   By: Jasmine Pang M.D.   On: 04/18/2022 22:07   CT Head Wo Contrast  Result Date: 04/18/2022 CLINICAL DATA:  Headache and bilateral hand tingling EXAM: CT HEAD WITHOUT CONTRAST TECHNIQUE: Contiguous axial images were obtained from the base of the skull through the vertex without intravenous contrast. RADIATION DOSE REDUCTION: This exam was performed according to the departmental dose-optimization program which includes automated exposure control, adjustment of the mA and/or kV according to patient size and/or use of iterative reconstruction technique. COMPARISON:  None Available. FINDINGS: Brain: No hemorrhage or intracranial mass. Hypodensity within the left cerebellum. Suspicion of subtle hypodensity within the left basal ganglia and potentially the subcortical white matter of the left frontal lobe. No midline shift. The ventricles are nonenlarged Vascular: No hyperdense vessels.  No unexpected calcification Skull: Normal. Negative for fracture or focal lesion. Sinuses/Orbits: No  acute finding. Other: None IMPRESSION: 1. Multifocal age indeterminate hypodensities within the left cerebellum, left basal ganglia and potentially left subcortical white matter, question ischemic foci. Recommend further evaluation with MRI. 2. Negative for hemorrhage mass or mass effect. Electronically Signed   By: Jasmine Pang M.D.   On: 04/18/2022 22:06    Microbiology: Recent Results (from the past 240 hour(s))  SARS Coronavirus 2 by RT PCR (hospital order, performed in [facility] hospital lab) *cepheid single result test* Anterior Nasal Swab     Status: None   Collection Time: 04/19/22 12:08 AM   Specimen: Anterior Nasal Swab  Result Value Ref Range Status   SARS Coronavirus 2 by RT PCR NEGATIVE NEGATIVE Final    Comment: (NOTE) SARS-CoV-2 target nucleic acids are NOT DETECTED.  The SARS-CoV-2 RNA is generally detectable in upper and lower respiratory specimens during the acute phase of infection. The lowest concentration of SARS-CoV-2 viral copies this assay can detect is 250 copies / mL. Allisen Pidgeon negative result does not preclude SARS-CoV-2 infection and should not be used as the sole basis for treatment or other patient management decisions.  Karletta Millay negative result may occur with improper specimen collection / handling, submission of specimen other than nasopharyngeal swab, presence of viral mutation(s) within the areas targeted by this assay, and inadequate number of viral copies (<250 copies / mL). Gibbs Naugle negative result must be combined with clinical observations, patient history, and epidemiological information.  Fact Sheet for Patients:   RoadLapTop.co.za  Fact Sheet for Healthcare Providers: http://kim-miller.com/  This test is not yet approved or  cleared by the [place] FDA and has been authorized for detection and/or diagnosis of SARS-CoV-2 by FDA under an Emergency Use Authorization (EUA).  This EUA will remain in effect (meaning  this test can be used) for the duration of the COVID-19 declaration under Section 564(b)(1) of the Act, 21 U.S.C. section 360bbb-3(b)(1), unless the authorization  is terminated or revoked sooner.  Performed at [facility], [address] [place], [place] [address]      Labs: Basic Metabolic Panel: Recent Labs  Lab 04/18/22 2131 04/19/22 0000 04/19/22 0545  NA 139  --  137  K 3.9  --  3.7  CL 110  --  110  CO2 23  --  22  GLUCOSE 86  --  90  BUN 8  --  6  CREATININE 0.83  --  0.84  CALCIUM 9.2  --  8.9  MG 2.2  --   --   PHOS  --  2.8  --    Liver Function Tests: Recent Labs  Lab 04/18/22 2131 04/19/22 0545  AST 26 18  ALT 18 14  ALKPHOS 71 58  BILITOT 0.5 0.7  PROT 8.5* 6.8  ALBUMIN 4.4 3.5   No results for input(s): "LIPASE", "AMYLASE" in the last 168 hours. No results for input(s): "AMMONIA" in the last 168 hours. CBC: Recent Labs  Lab 04/18/22 2131 04/19/22 0310  WBC 13.0* 14.1*  NEUTROABS 7.1  --   HGB 14.7 13.2  HCT 44.2 38.7  MCV 89.5 89.8  PLT 223 188   Cardiac Enzymes: Recent Labs  Lab 04/19/22 0000  CKTOTAL 160   BNP: BNP (last 3 results) Recent Labs    04/19/22 0000  BNP 23.9    ProBNP (last 3 results) No results for input(s): "PROBNP" in the last 8760 hours.  CBG: Recent Labs  Lab 04/18/22 2233  GLUCAP 88       Signed:  Lacretia Nicks MD.  [facility] 04/19/2022, 8:34 PM

## 2022-04-19 NOTE — Progress Notes (Signed)
PROGRESS NOTE    Theresa [name]  ZOX:[id number] DOB: 01/26/1991 DOA: 04/18/2022 PCP: Patient, No Pcp Per  Chief Complaint  Patient presents with   Headache   Numbness    Brief Narrative:  31 yo with hx MJ abuse, tobacco abuse presenting with headaches, paresthesias, and an episode of syncope.  She was seen on June 10th, but left AMA.  She noted chest pain on Saturday and episode of LOC.  Notes HA since then.  See H&P for additional info.  See below for additional details    Assessment & Plan:   Principal Problem:   Syncope Active Problems:   Elevated troponin   CVA (cerebral vascular accident) (HCC)   Headache   Marijuana use   Tobacco abuse   Assessment and Plan: * Syncope Related to stroke? Continue tele Follow EEG Negative CT PE protocol   Elevated troponin Appreciate cardiology recommendations, possibly related to stroke? planning for coronary CT followed by cath if conconcerning Echo without RWMA CT PE protocol without PE   CVA (cerebral vascular accident) (HCC) MRI with patchy multifocal acute to early subacute ischemic infarcts involving the L frontal lobe and L cerebellum MRI c spine within L vertebral artery which could be related to slow flow and/or occlusion CTA head/neck with increasing attenuation within proximal L vertebral artery within the neck with subsequent occlusion by the V2 segment, L vertebral artery largely occluded distally to the vertebrobasilar junction (presumably acute given presence of acute L cerebellar infarcts, possibly reflecting dissection) Neurology recommending continue IV heparin, echo, LE [place] - negative for DVT, hypercoag panel, 30 day cardiac monitor vs loop recoorder Echo with Ef 60-56%, negative agitated saline contrast bubble study -> neuro recommending TEE A1c 5.5, LDL 72  Headache Resolved after HA cocktail  Marijuana use Encourage cessation       DVT prophylaxis: heparin gtt Code Status: full Family  Communication: none Disposition:   Status is: Observation The patient will require care spanning > 2 midnights and should be moved to inpatient because: need for additional workup   Consultants:  Neurology cardiology  Procedures:  LE US echo  Antimicrobials:  Anti-infectives (From admission, onward)    None       Subjective: She expresses doubt in whether she'll go through with TEE Encouraged her to consider this as part of our workup for stroke for her Headache improved after cocktail   Objective: Vitals:   04/19/22 0800 04/19/22 0913 04/19/22 1214 04/19/22 1539  BP: 118/77 105/81 117/76 (!) 105/59  Pulse: (!) 58 (!) 58 61 (!) 58  Resp: (!) Temp:  98.2 F (36.8 C) 98.6 F (37 C) 97.6 F (36.4 C)  TempSrc:  Oral Oral Oral  SpO2: 100% 100%  100%  Weight:      Height:        Intake/Output Summary (Last 24 hours) at 04/19/2022 1859 Last data filed at 04/19/2022 1000 Gross per 24 hour  Intake 240 ml  Output --  Net 240 ml   Filed Weights   04/18/22 1637  Weight: 72.7 kg    Examination:  General exam: Appears calm and comfortable  Respiratory system: unlabored Cardiovascular system: RRR Gastrointestinal system: Abdomen is nondistended, soft and nontender Central nervous system: Alert and oriented. No focal neurological deficits. Extremities: no LEE   Data Reviewed: I have personally reviewed following labs and imaging studies  CBC: Recent Labs  Lab 04/18/22 2131 04/19/22 0310  WBC 13.0* 14.1*  NEUTROABS 7.1  --  HGB 14.7 13.2  HCT 44.2 38.7  MCV 89.5 89.8  PLT 223 188    Basic Metabolic Panel: Recent Labs  Lab 04/18/22 2131 04/19/22 0000 04/19/22 0545  NA 139  --  137  K 3.9  --  3.7  CL 110  --  110  CO2 23  --  22  GLUCOSE 86  --  90  BUN 8  --  6  CREATININE 0.83  --  0.84  CALCIUM 9.2  --  8.9  MG 2.2  --   --   PHOS  --  2.8  --     GFR: Estimated Creatinine Clearance: 85.6 mL/min (by C-G formula based on  SCr of 0.84 mg/dL).  Liver Function Tests: Recent Labs  Lab 04/18/22 2131 04/19/22 0545  AST 26 18  ALT 18 14  ALKPHOS 71 58  BILITOT 0.5 0.7  PROT 8.5* 6.8  ALBUMIN 4.4 3.5    CBG: Recent Labs  Lab 04/18/22 2233  GLUCAP 88     Recent Results (from the past 240 hour(s))  SARS Coronavirus 2 by RT PCR (hospital order, performed in [facility] hospital lab) *cepheid single result test* Anterior Nasal Swab     Status: None   Collection Time: 04/19/22 12:08 AM   Specimen: Anterior Nasal Swab  Result Value Ref Range Status   SARS Coronavirus 2 by RT PCR NEGATIVE NEGATIVE Final    Comment: (NOTE) SARS-CoV-2 target nucleic acids are NOT DETECTED.  The SARS-CoV-2 RNA is generally detectable in upper and lower respiratory specimens during the acute phase of infection. The lowest concentration of SARS-CoV-2 viral copies this assay can detect is 250 copies / mL. Jette Lewan negative result does not preclude SARS-CoV-2 infection and should not be used as the sole basis for treatment or other patient management decisions.  Jahkai Yandell negative result may occur with improper specimen collection / handling, submission of specimen other than nasopharyngeal swab, presence of viral mutation(s) within the areas targeted by this assay, and inadequate number of viral copies (<250 copies / mL). Peggy Monk negative result must be combined with clinical observations, patient history, and epidemiological information.  Fact Sheet for Patients:   RoadLapTop.co.za  Fact Sheet for Healthcare Providers: http://kim-miller.com/  This test is not yet approved or  cleared by the [place] FDA and has been authorized for detection and/or diagnosis of SARS-CoV-2 by FDA under an Emergency Use Authorization (EUA).  This EUA will remain in effect (meaning this test can be used) for the duration of the COVID-19 declaration under Section 564(b)(1) of the Act, 21 U.S.C. section  360bbb-3(b)(1), unless the authorization is terminated or revoked sooner.  Performed at [facility], [address], [place], [place] [address]          Radiology Studies: ECHOCARDIOGRAM COMPLETE BUBBLE STUDY  Result Date: 04/19/2022    ECHOCARDIOGRAM REPORT   Patient Name:   Theresa [name] Date of Exam: 04/19/2022 Medical Rec #:  [id number]      Height:       60.0 in Accession #:    [id number]     Weight:       160.2 lb Date of Birth:  December 28, 1990      BSA:          1.699 m Patient Age:    30 years       BP:           117/76 mmHg Patient Gender: F  HR:           56 bpm. Exam Location:  Inpatient Procedure: 2D Echo, Cardiac Doppler, Color Doppler and Saline Contrast Bubble            Study Indications:    Stroke  History:        Patient has no prior history of Echocardiogram examinations.                 Signs/Symptoms:Fever; Risk Factors:Current Smoker.  Sonographer:    Gertie Fey MHA, RDMS, RVT, RDCS Referring Phys: [id number] ANASTASSIA DOUTOVA  Sonographer Comments: Image acquisition challenging due to respiratory motion and constant movement with wincing and breath holding. IMPRESSIONS  1. Left ventricular ejection fraction, by estimation, is 60 to 65%. The left ventricle has normal function. The left ventricle has no regional wall motion abnormalities. Left ventricular diastolic parameters were normal.  2. Right ventricular systolic function is normal. The right ventricular size is normal. There is normal pulmonary artery systolic pressure.  3. The mitral valve is normal in structure. Trivial mitral valve regurgitation.  4. Acoustic window diffiicult Unable to define number of leaflets No stenosis. . The aortic valve is grossly normal. Aortic valve regurgitation is not visualized.  5. Agitated saline contrast bubble study was negative, with no evidence of any interatrial shunt. FINDINGS  Left Ventricle: Left ventricular ejection fraction, by estimation, is 60 to  65%. The left ventricle has normal function. The left ventricle has no regional wall motion abnormalities. Definity contrast agent was given IV to delineate the left ventricular  endocardial borders. The left ventricular internal cavity size was normal in size. There is no left ventricular hypertrophy. Left ventricular diastolic parameters were normal. Right Ventricle: The right ventricular size is normal. Right vetricular wall thickness was not assessed. Right ventricular systolic function is normal. There is normal pulmonary artery systolic pressure. The tricuspid regurgitant velocity is 1.87 m/s, and with an assumed right atrial pressure of 3 mmHg, the estimated right ventricular systolic pressure is 17.0 mmHg. Left Atrium: Left atrial size was normal in size. Right Atrium: Right atrial size was normal in size. Pericardium: There is no evidence of pericardial effusion. Mitral Valve: The mitral valve is normal in structure. Trivial mitral valve regurgitation. Tricuspid Valve: The tricuspid valve is normal in structure. Tricuspid valve regurgitation is trivial. Aortic Valve: Acoustic window diffiicult Unable to define number of leaflets No stenosis. The aortic valve is grossly normal. Aortic valve regurgitation is not visualized. Aortic valve mean gradient measures 2.0 mmHg. Aortic valve peak gradient measures 4.5 mmHg. Aortic valve area, by VTI measures 2.44 cm. Pulmonic Valve: The pulmonic valve was normal in structure. Pulmonic valve regurgitation is not visualized. Aorta: The aortic root is normal in size and structure. IAS/Shunts: No atrial level shunt detected by color flow Doppler. Agitated saline contrast was given intravenously to evaluate for intracardiac shunting. Agitated saline contrast bubble study was negative, with no evidence of any interatrial shunt.  LEFT VENTRICLE PLAX 2D LVIDd:         4.10 cm   Diastology LVIDs:         3.20 cm   LV e' medial:    11.30 cm/s LV PW:         0.70 cm   LV E/e'  medial:  6.3 LV IVS:        0.70 cm   LV e' lateral:   13.50 cm/s LVOT diam:     1.90 cm   LV E/e' lateral: 5.3  LV SV:         55 LV SV Index:   33 LVOT Area:     2.84 cm  RIGHT VENTRICLE RV S prime:     14.30 cm/s LEFT ATRIUM             Index        RIGHT ATRIUM          Index LA diam:        2.80 cm 1.65 cm/m   RA Area:     8.86 cm LA Vol (A2C):   35.0 ml 20.60 ml/m  RA Volume:   15.80 ml 9.30 ml/m LA Vol (A4C):   33.5 ml 19.72 ml/m LA Biplane Vol: 34.4 ml 20.25 ml/m  AORTIC VALVE AV Area (Vmax):    2.43 cm AV Area (Vmean):   2.17 cm AV Area (VTI):     2.44 cm AV Vmax:           106.00 cm/s AV Vmean:          74.900 cm/s AV VTI:            0.227 m AV Peak Grad:      4.5 mmHg AV Mean Grad:      2.0 mmHg LVOT Vmax:         90.70 cm/s LVOT Vmean:        57.300 cm/s LVOT VTI:          0.195 m LVOT/AV VTI ratio: 0.86  AORTA Ao Root diam: 2.75 cm MITRAL VALVE               TRICUSPID VALVE MV Area (PHT): 2.92 cm    TR Peak grad:   14.0 mmHg MV Decel Time: 260 msec    TR Vmax:        187.00 cm/s MV E velocity: 71.60 cm/s MV Dosha Broshears velocity: 57.40 cm/s  SHUNTS MV E/Aviyon Hocevar ratio:  1.25        Systemic VTI:  0.20 m                            Systemic Diam: 1.90 cm Dietrich Pates MD Electronically signed by Dietrich Pates MD Signature Date/Time: 04/19/2022/6:26:08 PM    Final    VAS [place] LOWER EXTREMITY VENOUS (DVT)  Result Date: 04/19/2022  Lower Venous DVT Study Patient Name:  MARYANA PITTMON  Date of Exam:   04/19/2022 Medical Rec #: [id number]       Accession #:    [id number] Date of Birth: [birth year]       Patient Gender: F Patient Age:   61 years Exam Location:  [facility] Procedure:      VAS [place] LOWER EXTREMITY VENOUS (DVT) Referring Phys: Jonny Ruiz DOUTOVA --------------------------------------------------------------------------------  Indications: Syncope.  Risk Factors: None identified. Comparison Study: No prior studies. Performing Technologist: Chanda Busing RVT  Examination Guidelines: Rayleen Wyrick complete evaluation  includes B-mode imaging, spectral Doppler, color Doppler, and power Doppler as needed of all accessible portions of each vessel. Bilateral testing is considered an integral part of Deniyah Dillavou complete examination. Limited examinations for reoccurring indications may be performed as noted. The reflux portion of the exam is performed with the patient in reverse Trendelenburg.  +---------+---------------+---------+-----------+----------+--------------+ RIGHT    CompressibilityPhasicitySpontaneityPropertiesThrombus Aging +---------+---------------+---------+-----------+----------+--------------+ CFV      Full           Yes      Yes                                 +---------+---------------+---------+-----------+----------+--------------+  SFJ      Full                                                        +---------+---------------+---------+-----------+----------+--------------+ FV Prox  Full                                                        +---------+---------------+---------+-----------+----------+--------------+ FV Mid   Full                                                        +---------+---------------+---------+-----------+----------+--------------+ FV DistalFull                                                        +---------+---------------+---------+-----------+----------+--------------+ PFV      Full                                                        +---------+---------------+---------+-----------+----------+--------------+ POP      Full           Yes      Yes                                 +---------+---------------+---------+-----------+----------+--------------+ PTV      Full                                                        +---------+---------------+---------+-----------+----------+--------------+ PERO     Full                                                         +---------+---------------+---------+-----------+----------+--------------+   +---------+---------------+---------+-----------+----------+--------------+ LEFT     CompressibilityPhasicitySpontaneityPropertiesThrombus Aging +---------+---------------+---------+-----------+----------+--------------+ CFV      Full           Yes      Yes                                 +---------+---------------+---------+-----------+----------+--------------+ SFJ      Full                                                        +---------+---------------+---------+-----------+----------+--------------+  FV Prox  Full                                                        +---------+---------------+---------+-----------+----------+--------------+ FV Mid   Full                                                        +---------+---------------+---------+-----------+----------+--------------+ FV DistalFull                                                        +---------+---------------+---------+-----------+----------+--------------+ PFV      Full                                                        +---------+---------------+---------+-----------+----------+--------------+ POP      Full           Yes      Yes                                 +---------+---------------+---------+-----------+----------+--------------+ PTV      Full                                                        +---------+---------------+---------+-----------+----------+--------------+ PERO     Full                                                        +---------+---------------+---------+-----------+----------+--------------+     Summary: RIGHT: - There is no evidence of deep vein thrombosis in the lower extremity.  - No cystic structure found in the popliteal fossa.  LEFT: - There is no evidence of deep vein thrombosis in the lower extremity.  - No cystic structure found in the popliteal fossa.   *See table(s) above for measurements and observations. Electronically signed by Heath Lark on 04/19/2022 at 6:03:57 PM.    Final    CT ANGIO HEAD W OR WO CONTRAST  Result Date: 04/19/2022 CLINICAL DATA:  Follow-up examination for acute stroke. EXAM: CT ANGIOGRAPHY HEAD AND NECK TECHNIQUE: Multidetector CT imaging of the head and neck was performed using the standard protocol during bolus administration of intravenous contrast. Multiplanar CT image reconstructions and MIPs were obtained to evaluate the vascular anatomy. Carotid stenosis measurements (when applicable) are obtained utilizing NASCET criteria, using the distal internal carotid diameter as the denominator. RADIATION DOSE REDUCTION: This exam was performed according to the departmental dose-optimization program which includes automated exposure control,  adjustment of the mA and/or kV according to patient size and/or use of iterative reconstruction technique. CONTRAST:  60mL OMNIPAQUE IOHEXOL 350 MG/ML SOLN COMPARISON:  Prior MRI from earlier the same day and CT from 04/18/2022. FINDINGS: CTA NECK FINDINGS Aortic arch: Visualized aortic arch normal in caliber with standard 3 vessel morphology. No stenosis or other abnormality about the origin the great vessels. Right carotid system: Right common and internal carotid arteries widely patent without stenosis, dissection or occlusion. Left carotid system: Left common and internal carotid arteries widely patent without stenosis, dissection or occlusion. Vertebral arteries: Both vertebral arteries arise from the subclavian arteries. No proximal subclavian artery stenosis. Right vertebral artery widely patent within the neck without stenosis or other abnormality. Left vertebral artery patent at its origin, but becomes it recently attenuated as it courses superiorly within the neck. The left vertebral artery occludes by the mid-distal left V2 segment, and remains occluded to the skull base. Skeleton: No  discrete or worrisome osseous lesions. Other neck: No other acute soft tissue abnormality within the neck. Upper chest: Visualized upper chest demonstrates no acute finding. Paraseptal emphysematous changes present at the lung apices. Review of the MIP images confirms the above findings CTA HEAD FINDINGS Anterior circulation: Both internal carotid arteries patent to the termini without stenosis. A1 segments patent. Normal anterior communicating complex. Anterior cerebral arteries widely patent. No M1 stenosis or occlusion right no visible proximal MCA branch occlusion. Distal MCA branches perfused and symmetric. Posterior circulation: Right vertebral artery widely patent to the vertebrobasilar junction. Right PICA faintly visible and is patent at its origin. Left vertebral artery occluded at the skull base. Minimal retrograde filling of the distal left V4 segment at the vertebrobasilar junction. Left V4 segment otherwise occluded. Left PICA not well seen. Basilar patent to its distal aspect without stenosis. Superior cerebellar and posterior cerebral arteries patent bilaterally. Venous sinuses: Patent allowing for timing the contrast bolus. Anatomic variants: None significant.  No aneurysm. Review of the MIP images confirms the above findings IMPRESSION: 1. Increasing attenuation within the proximal left vertebral artery within the neck, with subsequent occlusion by the V2 segment. Left vertebral artery remains largely occluded distally to the vertebrobasilar junction. 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 2. Otherwise normal CTA of the head and neck. No other large vessel occlusion. No other hemodynamically significant or correctable stenosis. 3.  Emphysema (ICD10-J43.9). Electronically Signed   By: Rise Mu M.D.   On: 04/19/2022 03:13   CT ANGIO NECK W OR WO CONTRAST  Result Date: 04/19/2022 CLINICAL DATA:  Follow-up  examination for acute stroke. EXAM: CT ANGIOGRAPHY HEAD AND NECK TECHNIQUE: Multidetector CT imaging of the head and neck was performed using the standard protocol during bolus administration of intravenous contrast. Multiplanar CT image reconstructions and MIPs were obtained to evaluate the vascular anatomy. Carotid stenosis measurements (when applicable) are obtained utilizing NASCET criteria, using the distal internal carotid diameter as the denominator. RADIATION DOSE REDUCTION: This exam was performed according to the departmental dose-optimization program which includes automated exposure control, adjustment of the mA and/or kV according to patient size and/or use of iterative reconstruction technique. CONTRAST:  60mL OMNIPAQUE IOHEXOL 350 MG/ML SOLN COMPARISON:  Prior MRI from earlier the same day and CT from 04/18/2022. FINDINGS: CTA NECK FINDINGS Aortic arch: Visualized aortic arch normal in caliber with standard 3 vessel morphology. No stenosis or other abnormality about the origin the great vessels. Right carotid system: Right  common and internal carotid arteries widely patent without stenosis, dissection or occlusion. Left carotid system: Left common and internal carotid arteries widely patent without stenosis, dissection or occlusion. Vertebral arteries: Both vertebral arteries arise from the subclavian arteries. No proximal subclavian artery stenosis. Right vertebral artery widely patent within the neck without stenosis or other abnormality. Left vertebral artery patent at its origin, but becomes it recently attenuated as it courses superiorly within the neck. The left vertebral artery occludes by the mid-distal left V2 segment, and remains occluded to the skull base. Skeleton: No discrete or worrisome osseous lesions. Other neck: No other acute soft tissue abnormality within the neck. Upper chest: Visualized upper chest demonstrates no acute finding. Paraseptal emphysematous changes present at the  lung apices. Review of the MIP images confirms the above findings CTA HEAD FINDINGS Anterior circulation: Both internal carotid arteries patent to the termini without stenosis. A1 segments patent. Normal anterior communicating complex. Anterior cerebral arteries widely patent. No M1 stenosis or occlusion right no visible proximal MCA branch occlusion. Distal MCA branches perfused and symmetric. Posterior circulation: Right vertebral artery widely patent to the vertebrobasilar junction. Right PICA faintly visible and is patent at its origin. Left vertebral artery occluded at the skull base. Minimal retrograde filling of the distal left V4 segment at the vertebrobasilar junction. Left V4 segment otherwise occluded. Left PICA not well seen. Basilar patent to its distal aspect without stenosis. Superior cerebellar and posterior cerebral arteries patent bilaterally. Venous sinuses: Patent allowing for timing the contrast bolus. Anatomic variants: None significant.  No aneurysm. Review of the MIP images confirms the above findings IMPRESSION: 1. Increasing attenuation within the proximal left vertebral artery within the neck, with subsequent occlusion by the V2 segment. Left vertebral artery remains largely occluded distally to the vertebrobasilar junction. 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 2. Otherwise normal CTA of the head and neck. No other large vessel occlusion. No other hemodynamically significant or correctable stenosis. 3.  Emphysema (ICD10-J43.9). Electronically Signed   By: Rise MuBenjamin  McClintock M.D.   On: 04/19/2022 03:13   CT Angio Chest PE W and/or Wo Contrast  Result Date: 04/19/2022 CLINICAL DATA:  Syncope. Pulmonary embolism (PE) suspected, high prob EXAM: CT ANGIOGRAPHY CHEST WITH CONTRAST TECHNIQUE: Multidetector CT imaging of the chest was performed using the standard protocol during bolus administration of  intravenous contrast. Multiplanar CT image reconstructions and MIPs were obtained to evaluate the vascular anatomy. RADIATION DOSE REDUCTION: This exam was performed according to the departmental dose-optimization program which includes automated exposure control, adjustment of the mA and/or kV according to patient size and/or use of iterative reconstruction technique. CONTRAST:  75mL OMNIPAQUE IOHEXOL 350 MG/ML SOLN COMPARISON:  None Available. FINDINGS: Cardiovascular: No filling defects in the pulmonary arteries to suggest pulmonary emboli. Heart is normal size. Aorta is normal caliber. Mediastinum/Nodes: No mediastinal, hilar, or axillary adenopathy. Trachea and esophagus are unremarkable. Thyroid unremarkable. Lungs/Pleura: Lungs are clear. No focal airspace opacities or suspicious nodules. No effusions. Upper Abdomen: Imaging into the upper abdomen demonstrates no acute findings. Musculoskeletal: Chest wall soft tissues are unremarkable. No acute bony abnormality. Review of the MIP images confirms the above findings. IMPRESSION: No evidence of pulmonary embolus. No acute cardiopulmonary disease. Electronically Signed   By: Charlett NoseKevin  Dover M.D.   On: 04/19/2022 02:38   MR Cervical Spine W or Wo Contrast  Result Date: 04/19/2022 CLINICAL DATA:  Initial evaluation for multiple sclerosis. EXAM: MRI CERVICAL SPINE  WITHOUT AND WITH CONTRAST TECHNIQUE: Multiplanar and multiecho pulse sequences of the cervical spine, to include the craniocervical junction and cervicothoracic junction, were obtained without and with intravenous contrast. CONTRAST:  7mL GADAVIST GADOBUTROL 1 MMOL/ML IV SOLN COMPARISON:  Comparison made with corresponding brain MRI performed at the same time. FINDINGS: Alignment: Straightening of the normal cervical lordosis. No listhesis. Vertebrae: Vertebral body height maintained without acute or chronic fracture. Bone marrow signal intensity diffusely decreased on T1 weighted sequence, nonspecific,  but most commonly related to anemia, smoking or obesity. No focal marrow replacing lesion. No abnormal marrow edema or enhancement. Cord: Normal signal and morphology. No evidence for demyelinating disease. No abnormal enhancement. Posterior Fossa, vertebral arteries, paraspinal tissues: Loss of normal flow void within the left vertebral artery, which could be related to slow flow and/or occlusion. Paraspinous soft tissues otherwise unremarkable. Disc levels: No significant disc pathology seen within the cervical spine. No focal disc herniation. No significant canal or neural foraminal stenosis or evidence for neural impingement. IMPRESSION: 1. Loss of normal flow void within the left vertebral artery, which could be related to slow flow and/or occlusion. Given the presence of the acute left cerebellar infarcts, finding raises the possibility for underlying dissection. Further evaluation with dedicated vascular imaging of the neck suggested for further evaluation. 2. Otherwise unremarkable and normal MRI of the cervical spine and spinal cord. No evidence for demyelinating disease. No significant stenosis or impingement. Electronically Signed   By: Rise Mu M.D.   On: 04/19/2022 01:22   MR Brain W and Wo Contrast  Result Date: 04/19/2022 CLINICAL DATA:  Initial evaluation for multiple sclerosis. EXAM: MRI HEAD WITHOUT AND WITH CONTRAST TECHNIQUE: Multiplanar, multiecho pulse sequences of the brain and surrounding structures were obtained without and with intravenous contrast. CONTRAST:  7mL GADAVIST GADOBUTROL 1 MMOL/ML IV SOLN COMPARISON:  Prior CT from 04/18/2022. FINDINGS: Brain: Cerebral volume within normal limits. Scattered patchy diffusion signal abnormality seen involving the cortical and subcortical aspect of the left frontal operculum and subinsular region. Additional patchy diffusion abnormality present at the inferior left cerebellum. Findings consistent with evolving acute to early  subacute ischemic infarcts. No associated hemorrhage or significant regional mass effect. No other focal parenchymal signal abnormality. No significant underlying cerebral white matter disease. No evidence for demyelinating disease. No acute or chronic intracranial blood products. No mass lesion, mass effect, or midline shift. No hydrocephalus or extra-axial fluid collection. Pituitary gland suprasellar region normal. No abnormal enhancement. Vascular: Major intracranial vascular flow voids are grossly maintained. Skull and upper cervical spine: Craniocervical junction within normal limits. Bone marrow signal intensity diffusely decreased on T1 weighted sequence, nonspecific, but most commonly related to anemia, smoking or obesity. No focal marrow replacing lesion. No scalp soft tissue abnormality. Sinuses/Orbits: Globes and orbital soft tissues within normal limits. Paranasal sinuses are clear. No mastoid effusion. Other: None. IMPRESSION: 1. 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 No associated hemorrhage or significant regional mass effect. 2. Otherwise normal brain MRI. No evidence for demyelinating disease. Electronically Signed   By: Rise Mu M.D.   On: 04/19/2022 01:15   DG Chest 2 View  Result Date: 04/18/2022 CLINICAL DATA:  Chest pain syncope EXAM: CHEST - 2 VIEW COMPARISON:  None Available. FINDINGS: The heart size and mediastinal contours are within normal limits. Both lungs are clear. The visualized skeletal structures are unremarkable. IMPRESSION: No active cardiopulmonary disease. Electronically Signed   By: Adrian Prows.D.  On: 04/18/2022 22:07   CT Head Wo Contrast  Result Date: 04/18/2022 CLINICAL DATA:  Headache and bilateral hand tingling EXAM: CT HEAD WITHOUT CONTRAST TECHNIQUE: Contiguous axial images were obtained from the base of the skull through the vertex without intravenous contrast. RADIATION  DOSE REDUCTION: This exam was performed according to the departmental dose-optimization program which includes automated exposure control, adjustment of the mA and/or kV according to patient size and/or use of iterative reconstruction technique. COMPARISON:  None Available. FINDINGS: Brain: No hemorrhage or intracranial mass. Hypodensity within the left cerebellum. Suspicion of subtle hypodensity within the left basal ganglia and potentially the subcortical white matter of the left frontal lobe. No midline shift. The ventricles are nonenlarged Vascular: No hyperdense vessels.  No unexpected calcification Skull: Normal. Negative for fracture or focal lesion. Sinuses/Orbits: No acute finding. Other: None IMPRESSION: 1. Multifocal age indeterminate hypodensities within the left cerebellum, left basal ganglia and potentially left subcortical white matter, question ischemic foci. Recommend further evaluation with MRI. 2. Negative for hemorrhage mass or mass effect. Electronically Signed   By: Jasmine Pang M.D.   On: 04/18/2022 22:06        Scheduled Meds:   stroke: early stages of recovery book   Does not apply Once   rosuvastatin  10 mg Oral Daily   Continuous Infusions:  heparin 900 Units/hr (04/19/22 1513)     LOS: 0 days    Time spent: over 30 min    Lacretia Nicks, MD [facility]   To contact the attending provider between 7A-7P or the covering provider during after hours 7P-7A, please log into the web site [email or web address] and access using universal [place] password for that web site. If you do not have the password, please call the hospital operator.  04/19/2022, 6:59 PM

## 2022-04-19 NOTE — ED Notes (Signed)
Report called to Thurston Hole, RN on 3W

## 2022-04-19 NOTE — Assessment & Plan Note (Signed)
Counseled regarding quitting 

## 2022-04-19 NOTE — Progress Notes (Signed)
    OVERNIGHT PROGRESS REPORT  Bed secured at [facility] RN aware - report  and transport notification in progress.  Neurology - contacted - updated.   Chinita Greenland MSNA MSN ACNPC-AG Acute Care Nurse Practitioner [facility]

## 2022-04-19 NOTE — Assessment & Plan Note (Signed)
Related to stroke? Continue tele Follow EEG Negative CT PE protocol

## 2022-04-19 NOTE — ED Notes (Signed)
Patient transported to CT 

## 2022-04-19 NOTE — Assessment & Plan Note (Signed)
Appreciate cardiology recommendations, possibly related to stroke? planning for coronary CT followed by cath if conconcerning Echo without RWMA CT PE protocol without PE

## 2022-04-19 NOTE — Assessment & Plan Note (Signed)
Resolved after HA cocktail

## 2022-04-19 NOTE — Plan of Care (Signed)
Patient presented to the [facility] ED yesterday night with a c/c of headache followed by vomiting as well as tingling in her bilateral hands and fingertips as well as V-shaped visual hallucination that waxes and wanes.   MRI brain revealed multifocal small subacute strokes. MRI neck revealed loss of normal flow void within the left vertebral artery, which was suggestive of slow flow and/or occlusion. Given the presence of the acute left cerebellar infarcts, finding raised the possibility for underlying dissection.  CTA of head and neck was then obtained, revealing increasing attenuation within the proximal left vertebral artery within the neck, with subsequent occlusion by the V2 segment. Left vertebral artery remains largely occluded distally to the vertebrobasilar junction. Finding is presumably acute given the presence of the acute left cerebellar infarcts, possibly reflecting a dissection.  Decision was made to start the patient on heparin gtt without bolus as the strokes were small in size with risk of hemorrhagic conversion on anticoagulation being felt to be significantly lower than the risk of clot propagation and further strokes off anticoagulation.   Discussed with Hospitalist team.   Patient is being transferred to 3W at [facility] for close neuromonitoring, Neurology consult and further stroke work up.    Discussed with Dr. Thomasena Edis in sign out.   Electronically signed: Dr. Caryl Pina

## 2022-04-19 NOTE — Progress Notes (Signed)
ANTICOAGULATION CONSULT NOTE - Initial Consult  Pharmacy Consult for Heparin Indication: stroke  Allergies  Allergen Reactions   Latex Itching, Swelling and Other (See Comments)    Reaction:  Localized swelling    Patient Measurements: Height: 5' (152.4 cm) Weight: 72.7 kg (160 lb 3.2 oz) IBW/kg (Calculated) : 45.5 HEPARIN DW (KG): 61.6  Vital Signs: Temp: 98.8 F (37.1 C) (06/13 2115) Temp Source: Oral (06/13 2115) BP: 111/74 (06/14 0315) Pulse Rate: 57 (06/14 0315)  Labs: Recent Labs    04/18/22 2131 04/18/22 2322 04/19/22 0000 04/19/22 0310  HGB 14.7  --   --   --   HCT 44.2  --   --   --   PLT 223  --   --   --   CREATININE 0.83  --   --   --   CKTOTAL  --   --  160  --   TROPONINIHS 1,421* 1,382*  --  1,028*    Estimated Creatinine Clearance: 86.6 mL/min (by C-G formula based on SCr of 0.83 mg/dL).   Medical History: Past Medical History:  Diagnosis Date   Anemia    Chlamydia    Depression    Eczema    Gonorrhea    Pyelonephritis    X 2    Medications:  Infusions:   sodium chloride 75 mL/hr at 04/19/22 0340    Assessment: 25 yoF with multiple infarcts per MRI.  Pharmacy consulted to start heparin without bolus per stroke nomogram by neurologist.  Not on anticoagulation PTA.   Baseline CBC WNL  Goal of Therapy:  Heparin level 0.3-0.5 units/ml Monitor platelets by anticoagulation protocol: Yes   Plan:  Start IV heparin infusion at 750 units/hr.  No bolus.  Check heparin level 8h after infusion started Daily heparin level & CBC while on heparin  Netta Cedars PharmD 04/19/2022,4:29 AM

## 2022-04-19 NOTE — Evaluation (Signed)
Physical Therapy Evaluation Patient Details Name: Theresa [name] MRN: [id number] DOB: Mar 08, 1991 Today's Date: 04/19/2022  History of Present Illness  Pt is a 31 y/o female admitted secondary to headache, dizziness, and numbness in hands. Imaging showed L frontal lobe and L cerebellar infarct. PMH includes  Clinical Impression  Pt admitted secondary to problem above with deficits below. Pt requiring min guard for mobility tasks. Mild instability with head turns, but no overt LOB noted. Pt reporting mild increase in headache during mobility. Anticipate pt will progress well and will likely not require follow up. Will continue to follow acutely.        Recommendations for follow up therapy are one component of a multi-disciplinary discharge planning process, led by the attending physician.  Recommendations may be updated based on patient status, additional functional criteria and insurance authorization.  Follow Up Recommendations No PT follow up    Assistance Recommended at Discharge Intermittent Supervision/Assistance  Patient can return home with the following  Assistance with cooking/housework    Equipment Recommendations None recommended by PT  Recommendations for Other Services       Functional Status Assessment Patient has had a recent decline in their functional status and demonstrates the ability to make significant improvements in function in a reasonable and predictable amount of time.     Precautions / Restrictions Precautions Precautions: Fall Restrictions Weight Bearing Restrictions: No      Mobility  Bed Mobility Overal bed mobility: Modified Independent                  Transfers Overall transfer level: Needs assistance Equipment used: None Transfers: Sit to/from Stand Sit to Stand: Min guard           General transfer comment: Slow and cautious to stand.    Ambulation/Gait Ambulation/Gait assistance: Min guard Gait Distance (Feet): 120  Feet Assistive device: None Gait Pattern/deviations: Step-through pattern, Decreased stride length Gait velocity: Decreased     General Gait Details: Slow, cautious gait. Mild instability noted, but no overt LOB with horizontal and vertical head turns. Educated about visual fixation when feeling dizzy  Stairs            Wheelchair Mobility    Modified Rankin (Stroke Patients Only) Modified Rankin (Stroke Patients Only) Pre-Morbid Rankin Score: No symptoms Modified Rankin: Moderately severe disability     Balance Overall balance assessment: Mild deficits observed, not formally tested                                           Pertinent Vitals/Pain Pain Assessment Pain Assessment: Faces Faces Pain Scale: Hurts little more Pain Location: headache Pain Descriptors / Indicators: Headache Pain Intervention(s): Limited activity within patient's tolerance, Monitored during session, Repositioned    Home Living Family/patient expects to be discharged to:: Private residence Living Arrangements: Parent;Children Available Help at Discharge: Family;Available 24 hours/day Type of Home: Apartment Home Access: Level entry       Home Layout: One level Home Equipment: None      Prior Function Prior Level of Function : Independent/Modified Independent                     Hand Dominance        Extremity/Trunk Assessment   Upper Extremity Assessment Upper Extremity Assessment: Defer to OT evaluation    Lower Extremity Assessment Lower Extremity Assessment: Overall  WFL for tasks assessed (Normal heel to shin test)    Cervical / Trunk Assessment Cervical / Trunk Assessment: Normal  Communication   Communication: No difficulties  Cognition Arousal/Alertness: Awake/alert Behavior During Therapy: WFL for tasks assessed/performed Overall Cognitive Status: No family/caregiver present to determine baseline cognitive functioning                                  General Comments: WFL for basic tasks.        General Comments      Exercises     Assessment/Plan    PT Assessment Patient needs continued PT services  PT Problem List Decreased balance;Decreased mobility;Decreased knowledge of precautions       PT Treatment Interventions Gait training;Functional mobility training;Therapeutic activities;Therapeutic exercise;Balance training;Patient/family education    PT Goals (Current goals can be found in the Care Plan section)  Acute Rehab PT Goals Patient Stated Goal: to go home PT Goal Formulation: With patient Time For Goal Achievement: 05/03/22 Potential to Achieve Goals: Good    Frequency Min 3X/week     Co-evaluation               AM-PAC PT "6 Clicks" Mobility  Outcome Measure Help needed turning from your back to your side while in a flat bed without using bedrails?: None Help needed moving from lying on your back to sitting on the side of a flat bed without using bedrails?: None Help needed moving to and from a bed to a chair (including a wheelchair)?: A Little Help needed standing up from a chair using your arms (e.g., wheelchair or bedside chair)?: A Little Help needed to walk in hospital room?: A Little Help needed climbing 3-5 steps with a railing? : A Little 6 Click Score: 20    End of Session   Activity Tolerance: Patient tolerated treatment well Patient left: in bed;with call bell/phone within reach Nurse Communication: Mobility status PT Visit Diagnosis: Unsteadiness on feet (R26.81);Other symptoms and signs involving the nervous system (R29.898)    Time: 1051-1106 PT Time Calculation (min) (ACUTE ONLY): 15 min   Charges:   PT Evaluation $PT Eval Low Complexity: 1 Low          Farley Ly, PT, DPT  Acute Rehabilitation Services  Office: [phone]   Lehman Prom 04/19/2022, 12:41 PM

## 2022-04-19 NOTE — Progress Notes (Signed)
RN notified per order for Neurology (L. Ernestina Columbia, NP) and Cardiology Adin Hector, PA).

## 2022-04-19 NOTE — Assessment & Plan Note (Addendum)
Unclear etiology currently chest pain-free.  Appreciate cardiology consult.  Obtain echogram continue cycle cardiac enzymes monitor on telemetry Currently no chest pain no ischemic changes on EKG Will hold off on heparin troponin is downtrending ER discussed with cardiology Endocrine with no heparin.  Echogram in the morning cardiology will see on arrival to [facility] Obtain urine toxicology screen Sed rate and CRP

## 2022-04-19 NOTE — Progress Notes (Signed)
Pt arrived from [facility]. A&Ox4. Tele placed. Heparin gtt confirmed and running. Skin accessed and intact. Call bell in reach. Bed locked and alarm set. Family has been notified by pt. At bedside is her personal cell phone. Educated on calling before getting up. No questions or concerns.

## 2022-04-19 NOTE — Assessment & Plan Note (Signed)
noted 

## 2022-04-19 NOTE — Hospital Course (Signed)
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  She was seen on June 10th, but left AMA.  She noted chest pain on Saturday and episode of LOC.  Notes HA since then.  See H&P for additional info.  See below for additional details

## 2022-04-19 NOTE — Assessment & Plan Note (Addendum)
.   Multifocal age indeterminate hypodensities within the left cerebellum, left basal ganglia and potentially left subcortical white matter, question ischemic foci. Recommend further evaluation with MRI. Neurology aware patient undergone MRI awaiting results. Plan to transfer to [facility] continue neurochecks Every 4 hours If evidence of CVA will need CVA work-up May need multiple sclerosis work-up.  Given upper extremity symptoms obtain MRI of the neck 1:31 AM MRI showing multiple CVAs. Discussed with Dr. Otelia Limes Will order CTA head and neck. As well as CTA chest to rule out PE given syncope and chest pain Will need echogram to rule out PFO Make sure patient is on daily aspirin Allow permissive hypertension PT OT assessment Lipid panel in a.m. check hemoglobin A1c check U tox

## 2022-04-19 NOTE — Progress Notes (Signed)
Patient received a call shortly after 7pm to report that she needed to  go home because her 5 underage children were at home unsupervised. Attending was paged by offgoing nurse to notify them of her departure against medical advice. Patient was advised that she was leaving against medical advice and that she should contact her PCP tomorrow to make them aware of her recent admission and discharge. She was also made aware that she can return to our emergency department if she has medical need. Patient verbalized understanding of all instructions. Patient was ambulatory and able to walk off the unit independently.

## 2022-04-20 ENCOUNTER — Encounter (HOSPITAL_COMMUNITY): Payer: Self-pay

## 2022-04-20 ENCOUNTER — Ambulatory Visit (HOSPITAL_COMMUNITY): Admit: 2022-04-20 | Payer: Medicaid Other | Admitting: Cardiovascular Disease

## 2022-04-20 LAB — ANA W/REFLEX IF POSITIVE: Anti Nuclear Antibody (ANA): NEGATIVE

## 2022-04-20 SURGERY — ECHOCARDIOGRAM, TRANSESOPHAGEAL
Anesthesia: Monitor Anesthesia Care

## 2022-04-30 ENCOUNTER — Emergency Department (HOSPITAL_COMMUNITY): Payer: Medicaid Other

## 2022-04-30 ENCOUNTER — Observation Stay (HOSPITAL_COMMUNITY): Payer: Medicaid Other

## 2022-04-30 ENCOUNTER — Observation Stay (HOSPITAL_COMMUNITY)
Admission: EM | Admit: 2022-04-30 | Discharge: 2022-05-02 | Disposition: A | Discharge status: Home / Self Care | Payer: Medicaid Other | Attending: Family Medicine | Admitting: Family Medicine

## 2022-04-30 ENCOUNTER — Other Ambulatory Visit: Payer: Self-pay

## 2022-04-30 ENCOUNTER — Encounter (HOSPITAL_COMMUNITY): Payer: Self-pay

## 2022-04-30 DIAGNOSIS — Z79899 Other long term (current) drug therapy: Secondary | ICD-10-CM | POA: Diagnosis not present

## 2022-04-30 DIAGNOSIS — F1290 Cannabis use, unspecified, uncomplicated: Secondary | ICD-10-CM | POA: Diagnosis present

## 2022-04-30 DIAGNOSIS — N390 Urinary tract infection, site not specified: Secondary | ICD-10-CM | POA: Diagnosis not present

## 2022-04-30 DIAGNOSIS — N3001 Acute cystitis with hematuria: Secondary | ICD-10-CM | POA: Diagnosis not present

## 2022-04-30 DIAGNOSIS — R42 Dizziness and giddiness: Secondary | ICD-10-CM | POA: Diagnosis not present

## 2022-04-30 DIAGNOSIS — R112 Nausea with vomiting, unspecified: Secondary | ICD-10-CM | POA: Diagnosis not present

## 2022-04-30 DIAGNOSIS — Z8673 Personal history of transient ischemic attack (TIA), and cerebral infarction without residual deficits: Secondary | ICD-10-CM | POA: Diagnosis not present

## 2022-04-30 DIAGNOSIS — I639 Cerebral infarction, unspecified: Secondary | ICD-10-CM | POA: Diagnosis not present

## 2022-04-30 DIAGNOSIS — D72829 Elevated white blood cell count, unspecified: Secondary | ICD-10-CM | POA: Diagnosis not present

## 2022-04-30 DIAGNOSIS — Z91040 Latex allergy status: Secondary | ICD-10-CM | POA: Insufficient documentation

## 2022-04-30 DIAGNOSIS — F17290 Nicotine dependence, other tobacco product, uncomplicated: Secondary | ICD-10-CM | POA: Diagnosis not present

## 2022-04-30 DIAGNOSIS — I6340 Cerebral infarction due to embolism of unspecified cerebral artery: Secondary | ICD-10-CM

## 2022-04-30 DIAGNOSIS — R001 Bradycardia, unspecified: Secondary | ICD-10-CM | POA: Diagnosis present

## 2022-04-30 LAB — URINALYSIS, ROUTINE W REFLEX MICROSCOPIC
Bilirubin Urine: NEGATIVE
Glucose, UA: NEGATIVE mg/dL
Ketones, ur: 20 mg/dL — AB
Nitrite: POSITIVE — AB
Protein, ur: NEGATIVE mg/dL
Specific Gravity, Urine: 1.015 (ref 1.005–1.030)
pH: 5 (ref 5.0–8.0)

## 2022-04-30 LAB — COMPREHENSIVE METABOLIC PANEL
ALT: 21 U/L (ref 0–44)
AST: 36 U/L (ref 15–41)
Albumin: 4.2 g/dL (ref 3.5–5.0)
Alkaline Phosphatase: 76 U/L (ref 38–126)
Anion gap: 10 (ref 5–15)
BUN: 7 mg/dL (ref 6–20)
CO2: 23 mmol/L (ref 22–32)
Calcium: 9.3 mg/dL (ref 8.9–10.3)
Chloride: 104 mmol/L (ref 98–111)
Creatinine, Ser: 0.93 mg/dL (ref 0.44–1.00)
GFR, Estimated: >60 mL/min (ref >60)
Glucose, Bld: 116 mg/dL — ABNORMAL HIGH (ref 70–99)
Potassium: 4.9 mmol/L (ref 3.5–5.1)
Sodium: 137 mmol/L (ref 135–145)
Total Bilirubin: 0.2 mg/dL — ABNORMAL LOW (ref 0.3–1.2)
Total Protein: 7.9 g/dL (ref 6.5–8.1)

## 2022-04-30 LAB — RAPID URINE DRUG SCREEN, HOSP PERFORMED
Amphetamines: NOT DETECTED
Barbiturates: NOT DETECTED
Benzodiazepines: NOT DETECTED
Cocaine: NOT DETECTED
Opiates: NOT DETECTED
Tetrahydrocannabinol: POSITIVE — AB

## 2022-04-30 LAB — CBC WITH DIFFERENTIAL/PLATELET
Abs Immature Granulocytes: 0.07 10*3/uL (ref 0.00–0.07)
Basophils Absolute: 0 10*3/uL (ref 0.0–0.1)
Basophils Relative: 0 %
Eosinophils Absolute: 0.1 10*3/uL (ref 0.0–0.5)
Eosinophils Relative: 1 %
HCT: 42.4 % (ref 36.0–46.0)
Hemoglobin: 14.4 g/dL (ref 12.0–15.0)
Immature Granulocytes: 1 %
Lymphocytes Relative: 17 %
Lymphs Abs: 2.4 10*3/uL (ref 0.7–4.0)
MCH: 30.7 pg (ref 26.0–34.0)
MCHC: 34 g/dL (ref 30.0–36.0)
MCV: 90.4 fL (ref 80.0–100.0)
Monocytes Absolute: 0.6 10*3/uL (ref 0.1–1.0)
Monocytes Relative: 4 %
Neutro Abs: 10.6 10*3/uL — ABNORMAL HIGH (ref 1.7–7.7)
Neutrophils Relative %: 77 %
Platelets: 265 10*3/uL (ref 150–400)
RBC: 4.69 MIL/uL (ref 3.87–5.11)
RDW: 13.2 % (ref 11.5–15.5)
WBC: 13.7 10*3/uL — ABNORMAL HIGH (ref 4.0–10.5)
nRBC: 0 % (ref 0.0–0.2)

## 2022-04-30 LAB — ETHANOL: Alcohol, Ethyl (B): <10 mg/dL (ref <10)

## 2022-04-30 LAB — PREGNANCY, URINE: Preg Test, Ur: NEGATIVE

## 2022-04-30 MED ORDER — IOHEXOL 350 MG/ML SOLN
100.0000 mL | Freq: Once | INTRAVENOUS | Status: AC | PRN
  Administered 2022-04-30: 100 mL via INTRAVENOUS

## 2022-04-30 MED ORDER — ONDANSETRON HCL 4 MG PO TABS: 4.0000 mg | ORAL_TABLET | Freq: Four times a day (QID) | ORAL | Status: DC | PRN

## 2022-04-30 MED ORDER — SODIUM CHLORIDE 0.9% FLUSH
3.0000 mL | Freq: Two times a day (BID) | INTRAVENOUS | Status: DC
  Administered 2022-04-30 – 2022-05-01 (×4): 3 mL via INTRAVENOUS

## 2022-04-30 MED ORDER — ACETAMINOPHEN 650 MG RE SUPP: 650.0000 mg | Freq: Four times a day (QID) | RECTAL | Status: DC | PRN

## 2022-04-30 MED ORDER — LORAZEPAM 2 MG/ML IJ SOLN
1.0000 mg | Freq: Once | INTRAMUSCULAR | Status: AC
  Administered 2022-04-30: 1 mg via INTRAVENOUS
  Filled 2022-04-30: qty 1

## 2022-04-30 MED ORDER — ACETAMINOPHEN 325 MG PO TABS: 650.0000 mg | ORAL_TABLET | Freq: Four times a day (QID) | ORAL | Status: DC | PRN

## 2022-04-30 MED ORDER — SODIUM CHLORIDE 0.9 % IV SOLN
1.0000 g | Freq: Once | INTRAVENOUS | Status: AC
  Administered 2022-04-30: 1 g via INTRAVENOUS
  Filled 2022-04-30: qty 10

## 2022-04-30 MED ORDER — ENOXAPARIN SODIUM 40 MG/0.4ML IJ SOSY
40.0000 mg | PREFILLED_SYRINGE | INTRAMUSCULAR | Status: DC
  Administered 2022-04-30 – 2022-05-01 (×2): 40 mg via SUBCUTANEOUS
  Filled 2022-04-30 (×3): qty 0.4

## 2022-04-30 MED ORDER — ASPIRIN 81 MG PO CHEW: 81.0000 mg | CHEWABLE_TABLET | Freq: Every day | ORAL | Status: DC

## 2022-04-30 MED ORDER — LORAZEPAM 2 MG/ML IJ SOLN
0.5000 mg | Freq: Once | INTRAMUSCULAR | Status: DC | PRN
Start: 2022-04-30 — End: 2022-05-02

## 2022-04-30 MED ORDER — ONDANSETRON HCL 4 MG/2ML IJ SOLN: 4.0000 mg | Freq: Four times a day (QID) | INTRAMUSCULAR | Status: DC | PRN

## 2022-04-30 MED ORDER — SODIUM CHLORIDE 0.9 % IV SOLN: INTRAVENOUS | Status: AC

## 2022-04-30 MED ORDER — ONDANSETRON HCL 4 MG/2ML IJ SOLN
4.0000 mg | Freq: Once | INTRAMUSCULAR | Status: AC
  Administered 2022-04-30: 4 mg via INTRAVENOUS
  Filled 2022-04-30: qty 2

## 2022-04-30 MED ORDER — ALBUTEROL SULFATE (2.5 MG/3ML) 0.083% IN NEBU
2.5000 mg | INHALATION_SOLUTION | Freq: Four times a day (QID) | RESPIRATORY_TRACT | Status: DC | PRN
Start: 2022-04-30 — End: 2022-05-02

## 2022-04-30 MED ORDER — CEFTRIAXONE SODIUM 1 G IJ SOLR
1.0000 g | INTRAMUSCULAR | Status: DC
Start: 2022-05-01 — End: 2022-05-02
  Administered 2022-05-01 – 2022-05-02 (×2): 1 g via INTRAVENOUS
  Filled 2022-04-30 (×2): qty 10

## 2022-04-30 MED ORDER — MECLIZINE HCL 12.5 MG PO TABS: 12.5000 mg | ORAL_TABLET | Freq: Once | ORAL | Status: DC

## 2022-05-01 DIAGNOSIS — Z8673 Personal history of transient ischemic attack (TIA), and cerebral infarction without residual deficits: Secondary | ICD-10-CM | POA: Diagnosis not present

## 2022-05-01 DIAGNOSIS — N3001 Acute cystitis with hematuria: Secondary | ICD-10-CM

## 2022-05-01 DIAGNOSIS — R42 Dizziness and giddiness: Secondary | ICD-10-CM | POA: Diagnosis not present

## 2022-05-01 DIAGNOSIS — I63212 Cerebral infarction due to unspecified occlusion or stenosis of left vertebral arteries: Secondary | ICD-10-CM | POA: Diagnosis not present

## 2022-05-01 LAB — C-REACTIVE PROTEIN: CRP: 0.6 mg/dL (ref <1.0)

## 2022-05-01 LAB — SEDIMENTATION RATE: Sed Rate: 25 mm/hr — ABNORMAL HIGH (ref 0–22)

## 2022-05-01 LAB — ANTITHROMBIN III: AntiThromb III Func: 95 % (ref 75–120)

## 2022-05-01 MED ORDER — ROSUVASTATIN CALCIUM 5 MG PO TABS
10.0000 mg | ORAL_TABLET | Freq: Every day | ORAL | Status: DC
  Administered 2022-05-01 – 2022-05-02 (×2): 10 mg via ORAL
  Filled 2022-05-01 (×2): qty 2

## 2022-05-01 MED ORDER — ASPIRIN 325 MG PO TBEC
325.0000 mg | DELAYED_RELEASE_TABLET | Freq: Every day | ORAL | Status: DC
  Administered 2022-05-01 – 2022-05-02 (×2): 325 mg via ORAL
  Filled 2022-05-01 (×2): qty 1

## 2022-05-01 MED ORDER — MECLIZINE HCL 25 MG PO TABS
25.0000 mg | ORAL_TABLET | Freq: Three times a day (TID) | ORAL | Status: DC | PRN
  Administered 2022-05-01 – 2022-05-02 (×2): 25 mg via ORAL
  Filled 2022-05-01 (×3): qty 1

## 2022-05-01 MED ORDER — CLOPIDOGREL BISULFATE 75 MG PO TABS
300.0000 mg | ORAL_TABLET | Freq: Once | ORAL | Status: AC
  Administered 2022-05-01: 300 mg via ORAL
  Filled 2022-05-01: qty 4

## 2022-05-01 MED ORDER — CLOPIDOGREL BISULFATE 75 MG PO TABS
75.0000 mg | ORAL_TABLET | Freq: Every day | ORAL | Status: DC
  Administered 2022-05-02: 75 mg via ORAL
  Filled 2022-05-01: qty 1

## 2022-05-01 NOTE — Progress Notes (Signed)
    [facility] has been requested to perform a transesophageal echocardiogram on Theresa [name] for stroke.  After careful review of history and examination, the risks and benefits of transesophageal echocardiogram have been explained including risks of esophageal damage, perforation (1:10,000 risk), bleeding, pharyngeal hematoma as well as other potential complications associated with conscious sedation including aspiration, arrhythmia, respiratory failure and death. Alternatives to treatment were discussed, questions were answered. Patient is willing to proceed.  TEE - Dr. Rennis Golden @ 0930. NPO after midnight.  Manson Passey, PA-C 05/01/2022 10:47 AM

## 2022-05-01 NOTE — Progress Notes (Signed)
PROGRESS NOTE   Theresa [name]  WUJ:[id number] DOB: Sep 21, 1991 DOA: 04/30/2022 PCP: Patient, No Pcp Per  Brief Narrative:  31 year old female known marijuana and tobacco use  Initial presentation 6/10 to ED with near syncope did not want to wait and left  6/13 intermittent finger arm numbness severe headaches-troponin found to be 1400 EKG nonischemic WBC 13--CXR no active disease CT head multifocal age-indeterminate hypodensities left cerebellum MRI patchy multifocal acute to early subacute ischemic infarcts MRI cervical spine showed loss of normal flow within vertebral artery?  Slow flow/occlusion CT neck showed increased attenuation in proximal left vertebral artery largely occluded distally at the vertebrobasilar junction?  Dissection  Neurology saw the patient recommended IV heparin echo hypercoagulable panel and 30-day monitor versus loop echo at that time EF 60-65% with negative bubble study and TEE was recommended  She left AGAINST MEDICAL ADVICE 6/13 in order to take care of her 5 underage children  Return to emergency room with persisting dizziness head to spinning difficult to keep balance headache left side of head and whole body numbness plus nausea and vomiting as well as abdominal pain BUN/creatinine 7/0.9, WBC 13.7 hemoglobin 14.4--MR brain small acute infarct cerebellar vermis new since prior MRI resolving infarcts left cerebellum left insula and posterior frontal matter CTA head abrupt occlusion of left vertebral artery at C4 level similar to prior CT angio left vertebral artery occluded at V4 2/2 dissection versus embolus carotids are widely patent in the neck Urinalysis also small leukocytes + nitrates and bacteria urine culture ordered started on Rocephin  Neurology saw patient recommended to treat vertigo with meclizine start hypercoagulable work-up--May need TEE this admission to rule out causes   Hospital-Problem based course  Vertigo occluded V4 left vertebral  arteries-new acute cerebellar vermis infarct Complete work-up getting TEE tomorrow and n.p.o. 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 Disposition as per neurology Vertigo seems to be improved on Antivert 25 3 times daily as needed-continue saline 75 cc/change Leukocytosis Etiology unlikely infectious--seems to have been present from 6/13, no fever nor chills--trend x 1 in am Impaired glucose tolerance last A1c 5.5 Monitor trends Marijuana tobacco use Needs outpatient counseling  DVT prophylaxis: Prophylactic Lovenox Code Status: Full Family Communication: None present at the bedside Disposition:  Status is: Observation The patient will require care spanning > 2 midnights and should be moved to inpatient because:   Will need full work-up of stroke   Procedures: None yet  Antimicrobials: No   Subjective: Playing on phone-looks like patient was able to walk 75 feet without dizziness after meclizine No chest pain or fever No nausea Does not like the food wants Grilled cheese  Objective: Vitals:   04/30/22 1849 04/30/22 2022 04/30/22 2045 05/01/22 0602  BP:  (!) 101/52 101/60   Pulse:  64    Resp:  19    Temp: 98.3 F (36.8 C) 98.4 F (36.9 C)  98.4 F (36.9 C)  TempSrc: Oral Axillary  Oral  SpO2:  100%    Weight: 70.8 kg     Height: 5' (1.524 m)       Intake/Output Summary (Last 24 hours) at 05/01/2022 0817 Last data filed at 05/01/2022 0300 Gross per 24 hour  Intake 530.5 ml  Output --  Net 530.5 ml   [employer]  04/30/22 1849  Weight: 70.8 kg    Examination:  EOMI NCAT no focal deficit no ict no pallor CTA B no added sound no rales no rhonchi ROM intact finger-nose-finger intact no past-pointing, power le's  is 5/5, deferred reflexes Abd soft nt nd no rebound Psych distracted  Data Reviewed: personally reviewed   CBC    Component Value Date/Time   WBC 13.7 (H) 04/30/2022 1010   RBC 4.69 04/30/2022 1010   HGB 14.4 04/30/2022 1010   HGB 11.1 02/26/2017 1534   HGB 10.8 03/06/2016 0000   HCT 42.4 04/30/2022 1010   HCT 32.5 (L) 02/26/2017 1534   HCT 32 03/06/2016 0000   PLT 265 04/30/2022 1010   PLT 255 02/26/2017 1534   PLT 209 03/06/2016 0000   MCV 90.4 04/30/2022 1010   MCV 84 02/26/2017 1534   MCH 30.7 04/30/2022 1010   MCHC 34.0 04/30/2022 1010   RDW 13.2 04/30/2022 1010   RDW 14.2 02/26/2017 1534   LYMPHSABS 2.4 04/30/2022 1010   LYMPHSABS 3.1 02/26/2017 1534   MONOABS 0.6 04/30/2022 1010   EOSABS 0.1 04/30/2022 1010   EOSABS 0.4 02/26/2017 1534   BASOSABS 0.0 04/30/2022 1010   BASOSABS 0.0 02/26/2017 1534      Latest Ref Rng & Units 04/30/2022   10:10 AM 04/19/2022    5:45 AM 04/18/2022    9:31 PM  CMP  Glucose 70 - 99 mg/dL 604  90  86   BUN 6 - 20 mg/dL 7  6  8    Creatinine 0.44 - 1.00 mg/dL 5.40  9.81  1.91   Sodium 135 - 145 mmol/L 137  137  139   Potassium 3.5 - 5.1 mmol/L 4.9  3.7  3.9   Chloride 98 - 111 mmol/L 104  110  110   CO2 22 - 32 mmol/L 23  22  23    Calcium 8.9 - 10.3 mg/dL 9.3  8.9  9.2   Total Protein 6.5 - 8.1 g/dL 7.9  6.8  8.5   Total Bilirubin 0.3 - 1.2 mg/dL 0.2  0.7  0.5   Alkaline Phos 38 - 126 U/L 76  58  71   AST 15 - 41 U/L 36  18  26   ALT 0 - 44 U/L 21  14  18       Radiology Studies: MR BRAIN WO CONTRAST  Result Date: 04/30/2022 CLINICAL DATA:  Acute neuro deficit. Dizziness nausea and bilateral numbness. History of recent stroke. Left vertebral artery occluded. EXAM: MRI HEAD WITHOUT CONTRAST TECHNIQUE: Multiplanar, multiecho pulse sequences of the brain and surrounding structures were obtained without intravenous contrast. COMPARISON:  MRI head 04/19/2022.  CT angio head neck 04/30/2022 FINDINGS: Brain: Small area of acute infarct  in the cerebellar vermis is new since the prior MRI. Left cerebellar infarct now appears chronic without restricted diffusion. Resolving small left MCA infarct without residual restricted diffusion. These are now hyperintense on T2 and FLAIR. No associated hemorrhage. No hydrocephalus or mass. Vascular: Normal arterial flow voids at the skull base. Skull and upper cervical spine: No focal abnormality. Sinuses/Orbits: Mild mucosal edema paranasal sinuses. Negative orbit Other: None IMPRESSION: 1. Small acute infarct in the cerebellar vermis, new since the prior MRI. 2. Resolving infarcts in the left cerebellum and left insula and posterior frontal white matter. Electronically Signed   By: Marlan Palau M.D.   On: 04/30/2022 17:57   CT ANGIO HEAD NECK W WO CM  Result Date: 04/30/2022 CLINICAL DATA:  Dizziness and vomiting.  Recent stroke. EXAM: CT ANGIOGRAPHY HEAD AND NECK TECHNIQUE: Multidetector CT imaging of the head and neck was performed using the standard protocol during bolus administration of intravenous contrast. Multiplanar CT image reconstructions and MIPs were obtained to evaluate the vascular anatomy. Carotid stenosis measurements (when applicable) are obtained utilizing NASCET criteria, using the distal internal carotid diameter as the denominator. RADIATION DOSE REDUCTION: This exam was performed according to the departmental dose-optimization program which includes automated exposure control, adjustment of the mA and/or kV according to patient size and/or use of iterative reconstruction technique. CONTRAST:  OMNIPAQUE IOHEXOL 350 MG/ML SOLN COMPARISON:  CT angio head neck 04/19/2022. MRI head 04/18/2022. CT head 04/30/2022 FINDINGS: CTA NECK FINDINGS Aortic arch: Standard branching. Imaged portion shows no evidence of aneurysm or dissection. No significant stenosis of the major arch vessel origins. Right carotid system: Normal right carotid system. Negative for atherosclerotic disease or  dissection Left carotid system: Normal left carotid system. Negative for atherosclerotic disease or dissection. Vertebral arteries: Right vertebral artery is normal and widely patent to the skull base Left vertebral artery is non dominant and patent proximally. The left vertebral artery occludes at the level C4 similar to the prior study. This remains occluded to the skull base. Skeleton: No acute skeletal abnormality. Poor dentition with numerous caries Other neck: Negative. Upper chest: Small subpleural blebs especially on the right. No acute abnormality. Review of the MIP images confirms the above findings CTA HEAD FINDINGS Anterior circulation: Internal carotid artery widely patent through the skull base and cavernous segment. Anterior and middle cerebral arteries normal bilaterally. No stenosis or aneurysm. Note is made of acute infarcts in the left insula and left posterior frontal lobe on the recent MRI. No vascular stenosis or occlusion to account for these infarct. Posterior circulation: Right vertebral artery widely patent to the basilar. Left vertebral artery occluded from the mid cervical spine to the V4 segment. Left PICA not visualized. Small right PICA patent. Basilar widely patent. Superior cerebellar and posterior cerebral arteries patent bilaterally without stenosis. Venous sinuses: Normal venous enhancement Anatomic variants: None Review of the MIP images confirms the above findings IMPRESSION: 1. Abrupt occlusion of the left vertebral artery at the C4 level similar to the recent CT angio. Left vertebral artery remains occluded to the V4 segment. This could be due to a dissection or possibly an embolus. Right vertebral artery normal 2. Carotid arteries widely patent in the neck. Anterior circulation is widely patent. No large vessel occlusion or stenosis to explain the left MCA infarcts on recent MRI. Possible emboli which have resolved. Electronically Signed   By: Marlan Palau M.D.   On:  04/30/2022 16:14   CT Head Wo Contrast  Result Date: 04/30/2022 CLINICAL DATA:  Altered mental status. EXAM: CT HEAD WITHOUT CONTRAST TECHNIQUE: Contiguous axial images were obtained from the base of the skull through the vertex without intravenous contrast. RADIATION DOSE REDUCTION: This exam was performed according to the departmental dose-optimization program which includes automated exposure control, adjustment of the mA and/or kV according to patient size and/or use of iterative reconstruction technique. COMPARISON:  04/19/2022 FINDINGS: Brain: No evidence of acute infarction, hemorrhage, hydrocephalus, extra-axial collection or mass lesion/mass effect. Mild evolutionary changes in the left cerebellar hemisphere infarct but no hemorrhage. Vascular: No hyperdense vessel or unexpected calcification. Skull: Normal. Negative for fracture or focal lesion. Sinuses/Orbits: The paranasal sinuses and mastoid air cells are clear. The globes are intact. Other: No scalp lesions or scalp hematoma. IMPRESSION:  Evolutionary changes in the left cerebellar infarct. No new/acute infarction or hemorrhage. Electronically Signed   By: Rudie Meyer M.D.   On: 04/30/2022 12:19     Scheduled Meds:  aspirin EC  325 mg Oral Daily   [START ON 05/02/2022] clopidogrel  75 mg Oral Daily   enoxaparin (LOVENOX) injection  40 mg Subcutaneous Q24H   sodium chloride flush  3 mL Intravenous Q12H   Continuous Infusions:  sodium chloride 75 mL/hr at 04/30/22 2118   cefTRIAXone (ROCEPHIN)  IV       LOS: 0 days   Time spent: 67  Rhetta Mura, MD [facility] To contact the attending provider between 7A-7P or the covering provider during after hours 7P-7A, please log into the web site [email or web address] and access using universal [place] password for that web site. If you do not have the password, please call the hospital operator.  05/01/2022, 8:17 AM

## 2022-05-02 ENCOUNTER — Observation Stay (HOSPITAL_BASED_OUTPATIENT_CLINIC_OR_DEPARTMENT_OTHER): Payer: Medicaid Other

## 2022-05-02 ENCOUNTER — Observation Stay (HOSPITAL_COMMUNITY): Payer: Medicaid Other | Admitting: Anesthesiology

## 2022-05-02 ENCOUNTER — Encounter (HOSPITAL_COMMUNITY)
Admission: EM | Disposition: A | Discharge status: Home / Self Care | Payer: Self-pay | Source: Home / Self Care | Attending: Emergency Medicine

## 2022-05-02 ENCOUNTER — Encounter (HOSPITAL_COMMUNITY): Payer: Self-pay | Admitting: Internal Medicine

## 2022-05-02 ENCOUNTER — Observation Stay (HOSPITAL_BASED_OUTPATIENT_CLINIC_OR_DEPARTMENT_OTHER): Payer: Medicaid Other | Admitting: Anesthesiology

## 2022-05-02 DIAGNOSIS — I639 Cerebral infarction, unspecified: Secondary | ICD-10-CM | POA: Diagnosis not present

## 2022-05-02 DIAGNOSIS — R42 Dizziness and giddiness: Secondary | ICD-10-CM | POA: Diagnosis not present

## 2022-05-02 DIAGNOSIS — N390 Urinary tract infection, site not specified: Secondary | ICD-10-CM | POA: Diagnosis not present

## 2022-05-02 DIAGNOSIS — I6340 Cerebral infarction due to embolism of unspecified cerebral artery: Secondary | ICD-10-CM

## 2022-05-02 DIAGNOSIS — Z8673 Personal history of transient ischemic attack (TIA), and cerebral infarction without residual deficits: Secondary | ICD-10-CM

## 2022-05-02 DIAGNOSIS — I361 Nonrheumatic tricuspid (valve) insufficiency: Secondary | ICD-10-CM | POA: Diagnosis not present

## 2022-05-02 HISTORY — PX: BUBBLE STUDY: SHX6837

## 2022-05-02 HISTORY — PX: TEE WITHOUT CARDIOVERSION: SHX5443

## 2022-05-02 LAB — COMPREHENSIVE METABOLIC PANEL
ALT: 11 U/L (ref 0–44)
AST: 16 U/L (ref 15–41)
Albumin: 3.2 g/dL — ABNORMAL LOW (ref 3.5–5.0)
Alkaline Phosphatase: 57 U/L (ref 38–126)
Anion gap: 6 (ref 5–15)
BUN: <5 mg/dL — ABNORMAL LOW (ref 6–20)
CO2: 24 mmol/L (ref 22–32)
Calcium: 8.8 mg/dL — ABNORMAL LOW (ref 8.9–10.3)
Chloride: 110 mmol/L (ref 98–111)
Creatinine, Ser: 0.98 mg/dL (ref 0.44–1.00)
GFR, Estimated: >60 mL/min (ref >60)
Glucose, Bld: 88 mg/dL (ref 70–99)
Potassium: 3.7 mmol/L (ref 3.5–5.1)
Sodium: 140 mmol/L (ref 135–145)
Total Bilirubin: 0.5 mg/dL (ref 0.3–1.2)
Total Protein: 6.1 g/dL — ABNORMAL LOW (ref 6.5–8.1)

## 2022-05-02 LAB — CBC WITH DIFFERENTIAL/PLATELET
Abs Immature Granulocytes: 0.02 10*3/uL (ref 0.00–0.07)
Basophils Absolute: 0 10*3/uL (ref 0.0–0.1)
Basophils Relative: 0 %
Eosinophils Absolute: 0.2 10*3/uL (ref 0.0–0.5)
Eosinophils Relative: 2 %
HCT: 36.6 % (ref 36.0–46.0)
Hemoglobin: 12.3 g/dL (ref 12.0–15.0)
Immature Granulocytes: 0 %
Lymphocytes Relative: 52 %
Lymphs Abs: 5.3 10*3/uL — ABNORMAL HIGH (ref 0.7–4.0)
MCH: 30 pg (ref 26.0–34.0)
MCHC: 33.6 g/dL (ref 30.0–36.0)
MCV: 89.3 fL (ref 80.0–100.0)
Monocytes Absolute: 0.5 10*3/uL (ref 0.1–1.0)
Monocytes Relative: 5 %
Neutro Abs: 4.2 10*3/uL (ref 1.7–7.7)
Neutrophils Relative %: 41 %
Platelets: 233 10*3/uL (ref 150–400)
RBC: 4.1 MIL/uL (ref 3.87–5.11)
RDW: 12.7 % (ref 11.5–15.5)
WBC: 10.2 10*3/uL (ref 4.0–10.5)
nRBC: 0 % (ref 0.0–0.2)

## 2022-05-02 LAB — PROTEIN S ACTIVITY: Protein S Activity: 45 % — ABNORMAL LOW (ref 63–140)

## 2022-05-02 LAB — LUPUS ANTICOAGULANT PANEL
DRVVT: 32.9 s (ref 0.0–47.0)
PTT Lupus Anticoagulant: 31.3 s (ref 0.0–43.5)

## 2022-05-02 LAB — CARDIOLIPIN ANTIBODIES, IGG, IGM, IGA
Anticardiolipin IgA: <9 APL U/mL (ref 0–11)
Anticardiolipin IgG: <9 GPL U/mL (ref 0–14)
Anticardiolipin IgM: <9 MPL U/mL (ref 0–12)

## 2022-05-02 LAB — HOMOCYSTEINE: Homocysteine: 16.2 umol/L — ABNORMAL HIGH (ref 0.0–14.5)

## 2022-05-02 LAB — PROTEIN C ACTIVITY: Protein C Activity: 114 % (ref 73–180)

## 2022-05-02 LAB — PROTEIN S, TOTAL: Protein S Ag, Total: 99 % (ref 60–150)

## 2022-05-02 LAB — ANA W/REFLEX IF POSITIVE: Anti Nuclear Antibody (ANA): NEGATIVE

## 2022-05-02 LAB — PROTEIN C, TOTAL: Protein C, Total: 84 % (ref 60–150)

## 2022-05-02 SURGERY — ECHOCARDIOGRAM, TRANSESOPHAGEAL
Anesthesia: Monitor Anesthesia Care

## 2022-05-02 MED ORDER — PROPOFOL 10 MG/ML IV BOLUS
INTRAVENOUS | Status: DC | PRN
  Administered 2022-05-02: 50 mg via INTRAVENOUS
  Administered 2022-05-02: 30 mg via INTRAVENOUS
  Administered 2022-05-02 (×2): 50 mg via INTRAVENOUS

## 2022-05-02 MED ORDER — ROSUVASTATIN CALCIUM 10 MG PO TABS: 10.0000 mg | ORAL_TABLET | Freq: Every day | ORAL | 11 refills | Status: DC

## 2022-05-02 MED ORDER — CLOPIDOGREL BISULFATE 75 MG PO TABS: 75.0000 mg | ORAL_TABLET | Freq: Every day | ORAL | 2 refills | Status: DC

## 2022-05-02 MED ORDER — NITROFURANTOIN MONOHYD MACRO 100 MG PO CAPS: 100.0000 mg | ORAL_CAPSULE | Freq: Two times a day (BID) | ORAL | 0 refills | Status: AC

## 2022-05-02 MED ORDER — LIDOCAINE 2% (20 MG/ML) 5 ML SYRINGE
INTRAMUSCULAR | Status: DC | PRN
  Administered 2022-05-02: 40 mg via INTRAVENOUS
  Administered 2022-05-02: 60 mg via INTRAVENOUS

## 2022-05-02 MED ORDER — PROPOFOL 500 MG/50ML IV EMUL
INTRAVENOUS | Status: DC | PRN
  Administered 2022-05-02: 50 ug/kg/min via INTRAVENOUS

## 2022-05-02 MED ORDER — PHENOL 1.4 % MT LIQD
1.0000 | OROMUCOSAL | Status: DC | PRN
  Administered 2022-05-02: 1 via OROMUCOSAL
  Filled 2022-05-02: qty 177

## 2022-05-02 MED ORDER — ASPIRIN 325 MG PO TBEC: 325.0000 mg | DELAYED_RELEASE_TABLET | Freq: Every day | ORAL | 2 refills | Status: DC

## 2022-05-02 MED ORDER — DEXMEDETOMIDINE (PRECEDEX) IN NS 20 MCG/5ML (4 MCG/ML) IV SYRINGE
PREFILLED_SYRINGE | INTRAVENOUS | Status: DC | PRN
  Administered 2022-05-02: 8 ug via INTRAVENOUS

## 2022-05-02 MED ORDER — MECLIZINE HCL 25 MG PO TABS: 25.0000 mg | ORAL_TABLET | Freq: Three times a day (TID) | ORAL | 0 refills | Status: DC | PRN

## 2022-05-02 MED ORDER — SODIUM CHLORIDE 0.9 % IV SOLN: INTRAVENOUS | Status: DC

## 2022-05-02 NOTE — Progress Notes (Signed)
Discharge summary packet provided to pt with instructions. Pt verbalized understanding of instructrions. NO complaints. Pt d/c to home as ordered. Pt's spouse is responsible for her transportation.

## 2022-05-02 NOTE — Progress Notes (Signed)
Physical Therapy Treatment Patient Details Name: Theresa [name] MRN: [id number] DOB: 27-Dec-1990 Today's Date: 05/02/2022   History of Present Illness Pt is a 30yo F admitted 6/25 with c/o dizziness and numbness in all extremities. Pt recently left [facility] on 6/14 after cerebellar and L frontal CVA. MRI showing new acute infarcts of cerebellum. PMH: anemia, chlamydia, depression, gonnorhea    PT Comments    Pt demonstrated increased tolerance to activity this session. Able to ambulate 233ft with supervision and perform dynamic balance tasks such as head turns and walking backwards with no complaints of dizziness. Pt with no further questions or concerns at this time about return home. OPPT remains most appropriate recommendation for this patient to continue to address balance deficits and improve tolerance to functional activity.    Recommendations for follow up therapy are one component of a multi-disciplinary discharge planning process, led by the attending physician.  Recommendations may be updated based on patient status, additional functional criteria and insurance authorization.  Follow Up Recommendations  Outpatient PT (neuro)     Assistance Recommended at Discharge PRN  Patient can return home with the following Assistance with cooking/housework;Assist for transportation   Equipment Recommendations  None recommended by PT    Recommendations for Other Services       Precautions / Restrictions Precautions Precautions: Fall Restrictions Weight Bearing Restrictions: No     Mobility  Bed Mobility Overal bed mobility: Modified Independent Bed Mobility: Supine to Sit, Sit to Supine           General bed mobility comments: HOB elevated    Transfers Overall transfer level: Independent Equipment used: None Transfers: Sit to/from Stand Sit to Stand: Independent                Ambulation/Gait Ambulation/Gait assistance: Supervision Gait Distance (Feet): 200  Feet Assistive device: None Gait Pattern/deviations: Step-through pattern, Decreased stride length Gait velocity: Decreased Gait velocity interpretation: >2.62 ft/sec, indicative of community ambulatory   General Gait Details: slow gait with mild unsteadiness, able to perform head turns and walk backwards with no LOB or complaints of dizziness   Stairs             Wheelchair Mobility    Modified Rankin (Stroke Patients Only)       Balance Overall balance assessment: Mild deficits observed, not formally tested                                          Cognition Arousal/Alertness: Awake/alert Behavior During Therapy: WFL for tasks assessed/performed Overall Cognitive Status: Within Functional Limits for tasks assessed                                          Exercises      General Comments        Pertinent Vitals/Pain Pain Assessment Pain Assessment: Faces Faces Pain Scale: Hurts a little bit Pain Location: throat Pain Descriptors / Indicators: Constant, Discomfort Pain Intervention(s): Monitored during session (throat spray)    Home Living                          Prior Function            PT Goals (current goals can now be found  in the care plan section) Acute Rehab PT Goals Patient Stated Goal: to go home PT Goal Formulation: With patient Time For Goal Achievement: 05/15/22 Potential to Achieve Goals: Good Progress towards PT goals: Progressing toward goals    Frequency    Min 4X/week      PT Plan Current plan remains appropriate    Co-evaluation              AM-PAC PT "6 Clicks" Mobility   Outcome Measure  Help needed turning from your back to your side while in a flat bed without using bedrails?: None Help needed moving from lying on your back to sitting on the side of a flat bed without using bedrails?: None Help needed moving to and from a bed to a chair (including a wheelchair)?:  None Help needed standing up from a chair using your arms (e.g., wheelchair or bedside chair)?: None Help needed to walk in hospital room?: A Little Help needed climbing 3-5 steps with a railing? : A Little 6 Click Score: 22    End of Session   Activity Tolerance: Patient tolerated treatment well Patient left: in bed;with call bell/phone within reach Nurse Communication: Mobility status PT Visit Diagnosis: Unsteadiness on feet (R26.81);Other symptoms and signs involving the nervous system (R29.898)     Time: 1610-9604 PT Time Calculation (min) (ACUTE ONLY): 11 min  Charges:  $Therapeutic Activity: 8-22 mins                     Davina Poke, SPT Acute Rehabilitation Services  Office: [phone]    Davina Poke 05/02/2022, 3:03 PM

## 2022-05-02 NOTE — Anesthesia Preprocedure Evaluation (Addendum)
Anesthesia Evaluation  Patient identified by MRN, date of birth, ID band Patient awake    Reviewed: Allergy & Precautions, NPO status , Patient's Chart, lab work & pertinent test results  Airway Mallampati: II  TM Distance: >3 FB Neck ROM: Full    Dental  (+) Dental Advisory Given, Chipped, Missing   Pulmonary Current Smoker and Patient abstained from smoking.,    Pulmonary exam normal breath sounds clear to auscultation       Cardiovascular negative cardio ROS Normal cardiovascular exam Rhythm:Regular Rate:Normal     Neuro/Psych  Headaches, PSYCHIATRIC DISORDERS Depression CVA, No Residual Symptoms    GI/Hepatic negative GI ROS, Neg liver ROS,   Endo/Other  Obesity   Renal/GU negative Renal ROS     Musculoskeletal negative musculoskeletal ROS (+)   Abdominal   Peds  Hematology negative hematology ROS (+)   Anesthesia Other Findings Day of surgery medications reviewed with the patient.  Reproductive/Obstetrics                            Anesthesia Physical Anesthesia Plan  ASA: 3  Anesthesia Plan: MAC   Post-op Pain Management: Minimal or no pain anticipated   Induction: Intravenous  PONV Risk Score and Plan: 1 and TIVA and Treatment may vary due to age or medical condition  Airway Management Planned: Natural Airway and Nasal Cannula  Additional Equipment:   Intra-op Plan:   Post-operative Plan:   Informed Consent: I have reviewed the patients History and Physical, chart, labs and discussed the procedure including the risks, benefits and alternatives for the proposed anesthesia with the patient or authorized representative who has indicated his/her understanding and acceptance.     Dental advisory given  Plan Discussed with: CRNA  Anesthesia Plan Comments:         Anesthesia Quick Evaluation

## 2022-05-03 ENCOUNTER — Encounter (HOSPITAL_COMMUNITY): Payer: Self-pay | Admitting: Internal Medicine

## 2022-05-03 ENCOUNTER — Telehealth: Payer: Self-pay

## 2022-05-03 LAB — BETA-2-GLYCOPROTEIN I ABS, IGG/M/A
Beta-2 Glyco I IgG: <9 GPI IgG units (ref 0–20)
Beta-2-Glycoprotein I IgA: <9 GPI IgA units (ref 0–25)
Beta-2-Glycoprotein I IgM: <9 GPI IgM units (ref 0–32)

## 2022-05-03 NOTE — Telephone Encounter (Signed)
Patient called in earlier for clarification on medication ordered at discharge from [facility] on 05/02/22, call to both numbers attempted, unable to complete the call at this time per recording on both numbers.

## 2022-05-04 NOTE — Telephone Encounter (Signed)
Patient called to follow up on her call on 05/03/22 for clarification on medication prescribed at discharge on 05/02/22, no answer on either phone listed in the chart, recording on both the call cannot be completed at this time, try the call again later.

## 2022-05-05 LAB — CULTURE, BLOOD (ROUTINE X 2)
Culture: NO GROWTH
Culture: NO GROWTH
Special Requests: ADEQUATE
Special Requests: ADEQUATE

## 2022-05-05 LAB — PROTHROMBIN GENE MUTATION

## 2022-05-08 LAB — FACTOR 5 LEIDEN

## 2022-05-10 ENCOUNTER — Ambulatory Visit: Payer: Medicaid Other | Admitting: Nurse Practitioner

## 2022-09-25 ENCOUNTER — Emergency Department (HOSPITAL_COMMUNITY): Payer: Medicaid Other

## 2022-09-25 ENCOUNTER — Encounter (HOSPITAL_COMMUNITY): Payer: Self-pay

## 2022-09-25 ENCOUNTER — Observation Stay (HOSPITAL_COMMUNITY)
Admission: EM | Admit: 2022-09-25 | Discharge: 2022-09-26 | Disposition: A | Discharge status: Home / Self Care | Payer: Medicaid Other | Attending: Internal Medicine | Admitting: Internal Medicine

## 2022-09-25 ENCOUNTER — Other Ambulatory Visit: Payer: Self-pay

## 2022-09-25 DIAGNOSIS — G459 Transient cerebral ischemic attack, unspecified: Secondary | ICD-10-CM

## 2022-09-25 DIAGNOSIS — Z7902 Long term (current) use of antithrombotics/antiplatelets: Secondary | ICD-10-CM | POA: Insufficient documentation

## 2022-09-25 DIAGNOSIS — Z720 Tobacco use: Secondary | ICD-10-CM

## 2022-09-25 DIAGNOSIS — Z7982 Long term (current) use of aspirin: Secondary | ICD-10-CM | POA: Insufficient documentation

## 2022-09-25 DIAGNOSIS — H538 Other visual disturbances: Secondary | ICD-10-CM | POA: Diagnosis not present

## 2022-09-25 DIAGNOSIS — E669 Obesity, unspecified: Secondary | ICD-10-CM | POA: Diagnosis not present

## 2022-09-25 DIAGNOSIS — R2990 Unspecified symptoms and signs involving the nervous system: Secondary | ICD-10-CM

## 2022-09-25 DIAGNOSIS — R4701 Aphasia: Secondary | ICD-10-CM | POA: Insufficient documentation

## 2022-09-25 DIAGNOSIS — M79604 Pain in right leg: Secondary | ICD-10-CM

## 2022-09-25 DIAGNOSIS — F17290 Nicotine dependence, other tobacco product, uncomplicated: Secondary | ICD-10-CM | POA: Diagnosis not present

## 2022-09-25 DIAGNOSIS — R42 Dizziness and giddiness: Secondary | ICD-10-CM | POA: Diagnosis present

## 2022-09-25 DIAGNOSIS — D72829 Elevated white blood cell count, unspecified: Secondary | ICD-10-CM | POA: Diagnosis not present

## 2022-09-25 DIAGNOSIS — M79605 Pain in left leg: Secondary | ICD-10-CM

## 2022-09-25 LAB — COMPREHENSIVE METABOLIC PANEL
ALT: 13 U/L (ref 0–44)
AST: 22 U/L (ref 15–41)
Albumin: 3.8 g/dL (ref 3.5–5.0)
Alkaline Phosphatase: 77 U/L (ref 38–126)
Anion gap: 11 (ref 5–15)
BUN: 11 mg/dL (ref 6–20)
CO2: 23 mmol/L (ref 22–32)
Calcium: 9.3 mg/dL (ref 8.9–10.3)
Chloride: 103 mmol/L (ref 98–111)
Creatinine, Ser: 0.83 mg/dL (ref 0.44–1.00)
GFR, Estimated: >60 mL/min (ref >60)
Glucose, Bld: 114 mg/dL — ABNORMAL HIGH (ref 70–99)
Potassium: 4.4 mmol/L (ref 3.5–5.1)
Sodium: 137 mmol/L (ref 135–145)
Total Bilirubin: 0.2 mg/dL — ABNORMAL LOW (ref 0.3–1.2)
Total Protein: 7.4 g/dL (ref 6.5–8.1)

## 2022-09-25 LAB — CBC WITH DIFFERENTIAL/PLATELET
Abs Immature Granulocytes: 0.03 10*3/uL (ref 0.00–0.07)
Basophils Absolute: 0 10*3/uL (ref 0.0–0.1)
Basophils Relative: 0 %
Eosinophils Absolute: 0.2 10*3/uL (ref 0.0–0.5)
Eosinophils Relative: 2 %
HCT: 42.8 % (ref 36.0–46.0)
Hemoglobin: 13.8 g/dL (ref 12.0–15.0)
Immature Granulocytes: 0 %
Lymphocytes Relative: 34 %
Lymphs Abs: 3.6 10*3/uL (ref 0.7–4.0)
MCH: 29.2 pg (ref 26.0–34.0)
MCHC: 32.2 g/dL (ref 30.0–36.0)
MCV: 90.5 fL (ref 80.0–100.0)
Monocytes Absolute: 0.5 10*3/uL (ref 0.1–1.0)
Monocytes Relative: 5 %
Neutro Abs: 6.5 10*3/uL (ref 1.7–7.7)
Neutrophils Relative %: 59 %
Platelets: 241 10*3/uL (ref 150–400)
RBC: 4.73 MIL/uL (ref 3.87–5.11)
RDW: 13.3 % (ref 11.5–15.5)
WBC: 10.9 10*3/uL — ABNORMAL HIGH (ref 4.0–10.5)
nRBC: 0 % (ref 0.0–0.2)

## 2022-09-25 LAB — I-STAT BETA HCG BLOOD, ED (MC, WL, AP ONLY): I-stat hCG, quantitative: <5 m[IU]/mL (ref <5)

## 2022-09-25 MED ORDER — IOHEXOL 350 MG/ML SOLN
75.0000 mL | Freq: Once | INTRAVENOUS | Status: AC | PRN
  Administered 2022-09-25: 75 mL via INTRAVENOUS

## 2022-09-25 NOTE — ED Notes (Signed)
Pt called for triage with no answer 

## 2022-09-25 NOTE — ED Notes (Signed)
Informed by MRI that they were unable to perform scan because of poor image quality due to metal in pt's hair that pt states is unable to be removed. PA notified.

## 2022-09-25 NOTE — Progress Notes (Signed)
Patient has weave in hair causing artifacts.  Patient states this is not removable.

## 2022-09-25 NOTE — ED Notes (Signed)
Patient called for triage, no answer. Staff at registration desk state they saw patient get back into car and drive away after completing registration. Unsure if patient was parking vehicle or if they did not plan to return.

## 2022-09-25 NOTE — ED Provider Notes (Incomplete)
[facility] EMERGENCY DEPARTMENT Provider Note   CSN: [id number] Arrival date & time: 09/25/22  1151     History {Add pertinent medical, surgical, social history, OB history to HPI:1} Chief Complaint  Patient presents with  . Neurologic Problem    Theresa [name] is a 31 y.o. female.  HPI 31 year old female with history of gonorrhea and chlamydia, pyelonephritis, depression, stroke in June of this year presents to the ER with complaints of dizziness, episode of aphasia and blurry vision.  Symptoms have been ongoing since Saturday.  She states "I think I may have had a stroke again".  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  She states that she had no residual effects from the stroke and had been overall feeling well until about 3 days ago.  No direct cause of the stroke was found as patient inpatient work-up was negative.    Home Medications Prior to Admission medications   Medication Sig Start Date End Date Taking? Authorizing Provider  aspirin EC 325 MG tablet Take 1 tablet (325 mg total) by mouth daily. 05/03/22   Rhetta Mura, MD  clopidogrel (PLAVIX) 75 MG tablet Take 1 tablet (75 mg total) by mouth daily. 05/03/22   Rhetta Mura, MD  meclizine (ANTIVERT) 25 MG tablet Take 1 tablet (25 mg total) by mouth 3 (three) times daily as needed for dizziness. 05/02/22   Rhetta Mura, MD  rosuvastatin (CRESTOR) 10 MG tablet Take 1 tablet (10 mg total) by mouth daily. 05/03/22   Rhetta Mura, MD      Allergies    Latex    Review of Systems   Review of Systems Ten systems reviewed and are negative for acute change, except as noted in the HPI.   Physical Exam Updated Vital Signs BP 104/64   Pulse 70   Temp 98.3 F (36.8 C)   Resp 16   Ht 5' (1.524 m)   Wt 70.8 kg   SpO2 100%   BMI 30.47 kg/m  Physical  Exam Vitals and nursing note reviewed.  Constitutional:      General: She is not in acute distress.    Appearance: She is well-developed.  HENT:     Head: Normocephalic and atraumatic.  Eyes:     Conjunctiva/sclera: Conjunctivae normal.  Cardiovascular:     Rate and Rhythm: Normal rate and regular rhythm.     Heart sounds: No murmur heard. Pulmonary:     Effort: Pulmonary effort is normal. No respiratory distress.     Breath sounds: Normal breath sounds.  Abdominal:     Palpations: Abdomen is soft.     Tenderness: There is no abdominal tenderness.  Musculoskeletal:        General: No swelling.     Cervical back: Neck supple.  Skin:    General: Skin is warm and dry.     Capillary Refill: Capillary refill takes less than 2 seconds.  Neurological:     Mental Status: She is alert.     Comments: Mental Status:  Alert, thought content appropriate, able to give a coherent history. Speech fluent without evidence of aphasia. Able to follow 2 step commands without difficulty.  Cranial Nerves:  II:  Peripheral visual fields grossly normal, pupils equal, round, reactive to light III,IV, VI: ptosis not present, extra-ocular motions intact bilaterally  V,VII: smile symmetric, facial light touch sensation  equal VIII: hearing grossly normal to voice  X: uvula elevates symmetrically  XI: bilateral shoulder shrug symmetric and strong XII: midline tongue extension without fassiculations Motor:  Normal tone. 5/5 strength of BUE and BLE major muscle groups including strong and equal grip strength and dorsiflexion/plantar flexion Sensory: light touch normal in all extremities. Cerebellar: normal finger-to-nose with bilateral upper extremities, Romberg sign absent Gait: not accessed     Psychiatric:        Mood and Affect: Mood normal.     ED Results / Procedures / Treatments   Labs (all labs ordered are listed, but only abnormal results are displayed) Labs Reviewed  CBC WITH  DIFFERENTIAL/PLATELET - Abnormal; Notable for the following components:      Result Value   WBC 10.9 (*)    All other components within normal limits  COMPREHENSIVE METABOLIC PANEL - Abnormal; Notable for the following components:   Glucose, Bld 114 (*)    Total Bilirubin 0.2 (*)    All other components within normal limits  I-STAT BETA HCG BLOOD, ED ([facility], [facility], [facility] ONLY)    EKG EKG Interpretation  Date/Time:  Monday September 25 2022 15:55:13 EST Ventricular Rate:  70 PR Interval:  180 QRS Duration: 94 QT Interval:  380 QTC Calculation: 410 R Axis:   66 Text Interpretation: Normal sinus rhythm Normal ECG When compared with ECG of 30-Apr-2022 10:31, PREVIOUS ECG IS PRESENT Confirmed by Wynona Dove ([id number]) on 09/25/2022 7:17:50 PM  Radiology CT ANGIO HEAD NECK W WO CM  Result Date: 09/25/2022 CLINICAL DATA:  Transient ischemic attack (TIA) EXAM: CT ANGIOGRAPHY HEAD AND NECK TECHNIQUE: Multidetector CT imaging of the head and neck was performed using the standard protocol during bolus administration of intravenous contrast. Multiplanar CT image reconstructions and MIPs were obtained to evaluate the vascular anatomy. Carotid stenosis measurements (when applicable) are obtained utilizing NASCET criteria, using the distal internal carotid diameter as the denominator. RADIATION DOSE REDUCTION: This exam was performed according to the departmental dose-optimization program which includes automated exposure control, adjustment of the mA and/or kV according to patient size and/or use of iterative reconstruction technique. CONTRAST:  38mL OMNIPAQUE IOHEXOL 350 MG/ML SOLN COMPARISON:  Same day CT head.  CTA June 14 23. FINDINGS: CTA NECK FINDINGS Aortic arch: Great vessel origins are patent without significant stenosis. Right carotid system: No evidence of dissection, stenosis (50% or greater), or occlusion. Left carotid system: No evidence of dissection, stenosis (50% or greater), or occlusion. Vertebral  arteries: Improved opacification of the left vertebral artery, likely related to healing dissection given the appearance on the prior. The V2 vertebral arteries is now opacified. Diminutive intradural segment with suspected superimposed stenosis and nonopacification. Right vertebral artery is patent without significant stenosis. Skeleton: No acute findings. Other neck: No acute findings. Upper chest: Visualized lung apices are clear. Paraseptal emphysema. Review of the MIP images confirms the above findings CTA HEAD FINDINGS Anterior circulation: Bilateral intracranial ICAs, MCAs, and ACAs are patent without proximal hemodynamically significant stenosis. Small (1-2 mm) posteriorly directed outpouching arising from the right supraclinoid ICA (series 7, image 108) Posterior circulation: Small/non dominant left intradural vertebral artery with suspected stenosis of the intradural segment which is not opacified. Right intradural vertebral artery the basilar artery and bilateral posterior cerebral arteries are patent without proximal hemodynamically significant stenosis. Venous sinuses: Poorly evaluated due to arterial timing. Review of the MIP images confirms the above findings IMPRESSION: 1. No new/emergent large vessel occlusion. 2. Improved opacification of the left vertebral artery,  likely related to healing dissection given the appearance on the prior. The V2 vertebral arteries is now opacified. Diminutive intradural segment with suspected superimposed stenosis and nonopacification. 3. Small (1-2 mm) posteriorly directed outpouching arising from the right supraclinoid ICA, which could represent an infundibulum or aneurysm. 4. Emphysema (ICD10-J43.9). Electronically Signed   By: Feliberto HartsFrederick S Jones M.D.   On: 09/25/2022 19:00   CT Head Wo Contrast  Result Date: 09/25/2022 CLINICAL DATA:  TIA, dizziness EXAM: CT HEAD WITHOUT CONTRAST TECHNIQUE: Contiguous axial images were obtained from the base of the skull  through the vertex without intravenous contrast. RADIATION DOSE REDUCTION: This exam was performed according to the departmental dose-optimization program which includes automated exposure control, adjustment of the mA and/or kV according to patient size and/or use of iterative reconstruction technique. COMPARISON:  04/30/2022 FINDINGS: Brain: No acute intracranial findings are seen. There are no signs of bleeding within the cranium. There is linear area of fluid density in the posterior left cerebellum suggesting old lacunar infarct. Vascular: Unremarkable. Skull: Unremarkable. Sinuses/Orbits: Unremarkable. Other: None. IMPRESSION: No acute findings are seen in noncontrast CT brain. Small old lacunar infarct is seen in left cerebellum. Electronically Signed   By: Ernie AvenaPalani  Rathinasamy M.D.   On: 09/25/2022 14:32    Procedures Procedures  {Document cardiac monitor, telemetry assessment procedure when appropriate:1}  Medications Ordered in ED Medications  iohexol (OMNIPAQUE) 350 MG/ML injection 75 mL (75 mLs Intravenous Contrast Given 09/25/22 1837)    ED Course/ Medical Decision Making/ A&P                           Medical Decision Making Amount and/or Complexity of Data Reviewed Radiology: ordered.   This patient complains of stroke like symptoms, this involves an extensive number of treatment options, and is a complaint that carries with it a high risk of complications and morbidity.  The differential diagnosis includes stroke, intracranial bleed, TIA, migraine, vertigo  I Ordered, reviewed, and interpreted labs, which included  -CBC with mild leukocytosis of 10.9, likely nonspecific, hemoglobin stable -CMP without any significant abnormalities -Pregnancy negative   I ordered imaging studies which included  CTA of the head, MRI, CT of the head  I independently visualized and interpreted imaging which showed  -CT of the head without any acute findings, evidence of small old lacunar  infarct -CTA of the head and neck IMPRESSION:  1. No new/emergent large vessel occlusion.  2. Improved opacification of the left vertebral artery, likely  related to healing dissection given the appearance on the prior. The  V2 vertebral arteries is now opacified. Diminutive intradural  segment with suspected superimposed stenosis and nonopacification.  3. Small (1-2 mm) posteriorly directed outpouching arising from the  right supraclinoid ICA, which could represent an infundibulum or  aneurysm.  -Unable to obtain MRI as the patient may have metal in her we have and image quality was very poor per MRI techs.  Able to remove with Additional history obtained from chart review Previous records obtained and reviewed  I consulted Dr. Derry LoryKhaliqdina with neurology and discussed lab and imaging findings.  He recommends admission for stroke team evaluation and consideration of anticoagulation.  Recommended lower extremity Dopplers bilaterally to rule out DVT as cause of her symptoms, ordered. Consulted hospitalist for admission.    After the interventions stated above, I reevaluated the patient and found the patient would benefit from admission for further stroke work-up  Final Clinical Impression(s) / ED Diagnoses  Final diagnoses:  Stroke-like symptoms    Rx / DC Orders ED Discharge Orders     None

## 2022-09-25 NOTE — ED Provider Notes (Signed)
[facility] EMERGENCY DEPARTMENT Provider Note   CSN: [id number] Arrival date & time: 09/25/22  1151     History {Add pertinent medical, surgical, social history, OB history to HPI:1} Chief Complaint  Patient presents with   Neurologic Problem    Theresa [name] is a 31 y.o. female.  HPI     Home Medications Prior to Admission medications   Medication Sig Start Date End Date Taking? Authorizing Provider  aspirin EC 325 MG tablet Take 1 tablet (325 mg total) by mouth daily. 05/03/22   Rhetta Mura, MD  clopidogrel (PLAVIX) 75 MG tablet Take 1 tablet (75 mg total) by mouth daily. 05/03/22   Rhetta Mura, MD  meclizine (ANTIVERT) 25 MG tablet Take 1 tablet (25 mg total) by mouth 3 (three) times daily as needed for dizziness. 05/02/22   Rhetta Mura, MD  rosuvastatin (CRESTOR) 10 MG tablet Take 1 tablet (10 mg total) by mouth daily. 05/03/22   Rhetta Mura, MD      Allergies    Latex    Review of Systems   Review of Systems  Physical Exam Updated Vital Signs BP 106/62   Pulse 75   Temp 98.8 F (37.1 C) (Oral)   Resp 20   Ht 5' (1.524 m)   Wt 70.8 kg   SpO2 99%   BMI 30.47 kg/m  Physical Exam  ED Results / Procedures / Treatments   Labs (all labs ordered are listed, but only abnormal results are displayed) Labs Reviewed  CBC WITH DIFFERENTIAL/PLATELET - Abnormal; Notable for the following components:      Result Value   WBC 10.9 (*)    All other components within normal limits  COMPREHENSIVE METABOLIC PANEL - Abnormal; Notable for the following components:   Glucose, Bld 114 (*)    Total Bilirubin 0.2 (*)    All other components within normal limits  I-STAT BETA HCG BLOOD, ED ([facility], [facility], [facility] ONLY)    EKG None  Radiology CT Head Wo Contrast  Result Date: 09/25/2022 CLINICAL DATA:  TIA, dizziness EXAM: CT HEAD WITHOUT CONTRAST TECHNIQUE: Contiguous axial images were obtained from the base of the skull through  the vertex without intravenous contrast. RADIATION DOSE REDUCTION: This exam was performed according to the departmental dose-optimization program which includes automated exposure control, adjustment of the mA and/or kV according to patient size and/or use of iterative reconstruction technique. COMPARISON:  04/30/2022 FINDINGS: Brain: No acute intracranial findings are seen. There are no signs of bleeding within the cranium. There is linear area of fluid density in the posterior left cerebellum suggesting old lacunar infarct. Vascular: Unremarkable. Skull: Unremarkable. Sinuses/Orbits: Unremarkable. Other: None. IMPRESSION: No acute findings are seen in noncontrast CT brain. Small old lacunar infarct is seen in left cerebellum. Electronically Signed   By: Ernie Avena M.D.   On: 09/25/2022 14:32    Procedures Procedures  {Document cardiac monitor, telemetry assessment procedure when appropriate:1}  Medications Ordered in ED Medications  iohexol (OMNIPAQUE) 350 MG/ML injection 75 mL (75 mLs Intravenous Contrast Given 09/25/22 1837)    ED Course/ Medical Decision Making/ A&P                           Medical Decision Making Amount and/or Complexity of Data Reviewed Radiology: ordered.   ***  {Document critical care time when appropriate:1} {Document review of labs and clinical decision tools ie heart score, Chads2Vasc2 etc:1}  {Document your independent review of  radiology images, and any outside records:1} {Document your discussion with family members, caretakers, and with consultants:1} {Document social determinants of health affecting pt's care:1} {Document your decision making why or why not admission, treatments were needed:1} Final Clinical Impression(s) / ED Diagnoses Final diagnoses:  None    Rx / DC Orders ED Discharge Orders     None

## 2022-09-25 NOTE — ED Notes (Signed)
Patient states she doesn't feel like her she did on Friday however just wanted to make sure she hasn't had a stroke, instructed patient that in the future if she ever had stoke sx to come to ED immediately not to wait.

## 2022-09-25 NOTE — ED Notes (Signed)
Pt called multiple times for triage and still no response. Moving pt OTF.

## 2022-09-25 NOTE — ED Provider Triage Note (Signed)
Emergency Medicine Provider Triage Evaluation Note  [name] , a 31 y.o. female  was evaluated in triage.  Pt complains of strokelike symptoms.  Patient has history of known occlusion of left vertebral artery.  She states this past Friday began to feel dizziness while sitting while driving with associated gait instability when she got out of her car and difficulty expressing words.  She also reports headache beginning around the same time with persistent since onset.  She states she feels back at baseline currently.  She has had no follow-up since prior hospitalization.  Denies current visual disturbance, gait abnormality, weakness or sensory deficits normal lower extremities, facial droop, slurred speech.  Review of Systems  Positive: See above Negative:   Physical Exam  BP 110/66 (BP Location: Left Arm)   Pulse 93   Temp 99 F (37.2 C) (Oral)   Resp 16   Ht 5' (1.524 m)   Wt 70.8 kg   SpO2 100%   BMI 30.47 kg/m  Gen:   Awake, no distress   Resp:  Normal effort  MSK:   Moves extremities without difficulty  Other:  Cranial nerves III through XII grossly intact.  PERRLA bilaterally.  Medical Decision Making  Medically screening exam initiated at 1:05 PM.  Appropriate orders placed.  Kalista L Harter was informed that the remainder of the evaluation will be completed by another provider, this initial triage assessment does not replace that evaluation, and the importance of remaining in the ED until their evaluation is complete.     Peter Garter, [place] 09/25/22 1308

## 2022-09-25 NOTE — Consult Note (Signed)
NEUROLOGY CONSULTATION NOTE   Date of service: September 25, 2022 Patient Name: Theresa [name] MRN:  [id number] DOB:  1991/01/28 Reason for consult: "episode of vertigo and another episode of word finding difficulty" Requesting Provider: Sloan Leiter, DO _ _ _   _ __   _ __ _ _  __ __   _ __   __ _  History of Present Illness  Theresa [name] is a 31 y.o. female with PMH significant for prior STDs, pyelonephritis, depression, anemia, cryptogenic strokes with no residual deficits who presents with episode of vertigo on Friday.  Patient reports that she was driving on Friday but out of nowhere she had vertigo and had to stop at the side of the road.  When she got out of her car, she noted that she was very off balance and was unable to walk.  The episode lasted 45 minutes and resolved by itself.  She reports that over the weekend, she would have intermittent vertigo and also reports an episode of word finding difficulty that self resolved.  She reports that vertigo felt similar to her prior stroke back in June.  She was worried about it and she came to the ED for further evaluation and work-up.  In the ED, CT head without contrast was obtained which was negative for any acute intracranial abnormalities.  She was unable to get an MRI of her brain because of weave tied into her hair that is sewn into her hair and unable to take out in the hospital.  She reports that she continues to smoke.  No family history of strokes.  She denies any symptoms at this time.  She also reports intermittent sharp pain in BL lower extremities and she is worried about DVTs.  LKW: Friday 09/22/22. mRS: 0 tNKASE: not offered, no symptoms at this time. Thrombectomy: not offered, no symptoms at this time. NIHSS components Score: Comment  1a Level of Conscious 0[x]  1[]  2[]  3[]      1b LOC Questions 0[x]  1[]  2[]       1c LOC Commands 0[x]  1[]  2[]       2 Best Gaze 0[x]  1[]  2[]       3 Visual 0[x]  1[]  2[]  3[]      4  Facial Palsy 0[x]  1[]  2[]  3[]      5a Motor Arm - left 0[x]  1[]  2[]  3[]  4[]  UN[]    5b Motor Arm - Right 0[x]  1[]  2[]  3[]  4[]  UN[]    6a Motor Leg - Left 0[x]  1[]  2[]  3[]  4[]  UN[]    6b Motor Leg - Right 0[x]  1[]  2[]  3[]  4[]  UN[]    7 Limb Ataxia 0[x]  1[]  2[]  3[]  UN[]     8 Sensory 0[x]  1[]  2[]  UN[]      9 Best Language 0[x]  1[]  2[]  3[]      10 Dysarthria 0[x]  1[]  2[]  UN[]      11 Extinct. and Inattention 0[x]  1[]  2[]       TOTAL: 0      ROS   Constitutional Denies weight loss, fever and chills.   HEENT Denies changes in vision and hearing.   Respiratory Denies SOB and cough.   CV Denies palpitations and CP   GI Denies abdominal pain, nausea, vomiting and diarrhea.   GU Denies dysuria and urinary frequency.   MSK Denies myalgia and joint pain.   Skin Denies rash and pruritus.   Neurological Denies headache and syncope.   Psychiatric Denies recent changes in mood. Denies anxiety and depression.    Past History  Past Medical History:  Diagnosis Date   Anemia    Chlamydia    Depression    Eczema    Gonorrhea    Pyelonephritis    X 2   Past Surgical History:  Procedure Laterality Date   BUBBLE STUDY  05/02/2022   Procedure: BUBBLE STUDY;  Surgeon: Theresa [name], Kenneth C, MD;  [facility];  Service: Cardiovascular;;   CESAREAN SECTION N/A 05/20/2017   Procedure: CESAREAN SECTION;  Surgeon: Theresa [name], Luther H, MD;  [facility];  Service: Obstetrics;  Laterality: N/A;   NO PAST SURGERIES     TEE WITHOUT CARDIOVERSION N/A 05/02/2022   Procedure: TRANSESOPHAGEAL ECHOCARDIOGRAM (TEE);  Surgeon: Theresa [name], Kenneth C, MD;  [facility];  Service: Cardiovascular;  Laterality: N/A;   TUBAL LIGATION Bilateral 05/20/2017   Procedure: BILATERAL TUBAL LIGATION;  Surgeon: Theresa [name], Luther H, MD;  [facility];  Service: Obstetrics;  Laterality: Bilateral;   Family History  Problem Relation Age of Onset   Diabetes Mother    Asthma Daughter    Mayford KnifeWilliams syndrome  Son    Birth defects Neg Hx    Stroke Neg Hx    Hypertension Neg Hx    Anesthesia problems Neg Hx    Hypotension Neg Hx    Malignant hyperthermia Neg Hx    Pseudochol deficiency Neg Hx    Social History   Socioeconomic History   Marital status: Single    Spouse name: Not on file   Number of children: Not on file   Years of education: Not on file   Highest education level: Not on file  Occupational History   Not on file  Tobacco Use   Smoking status: Some Days    Types: Cigars   Smokeless tobacco: Former    Quit date: 07/08/2019   Tobacco comments:    4 cig/day  Vaping Use   Vaping Use: Never used  Substance and Sexual Activity   Alcohol use: Not Currently    Comment: occas. before knowing of pregnancy   Drug use: Not Currently    Types: Marijuana    Comment: last use one week ago   Sexual activity: Yes    Birth control/protection: None, Surgical  Other Topics Concern   Not on file  Social History Narrative   Not on file   Social Determinants of Health   Financial Resource Strain: Not on file  Food Insecurity: No Food Insecurity (05/11/2020)   Hunger Vital Sign    Worried About Running Out of Food in the Last Year: Never true    Ran Out of Food in the Last Year: Never true  Transportation Needs: Unmet Transportation Needs (05/11/2020)   PRAPARE - Administrator, Civil ServiceTransportation    Lack of Transportation (Medical): Yes    Lack of Transportation (Non-Medical): Yes  Physical Activity: Not on file  Stress: Not on file  Social Connections: Not on file   Allergies  Allergen Reactions   Latex Itching, Swelling and Other (See Comments)    Reaction:  Localized swelling    Medications  (Not in a hospital admission)    Vitals   Vitals:   09/25/22 1242 09/25/22 1245 09/25/22 1610 09/25/22 1855  BP: 110/66  106/62 104/64  Pulse: 93  75 70  Resp: 16  20 16   Temp: 99 F (37.2 C)  98.8 F (37.1 C) 98.3 F (36.8 C)  TempSrc: Oral  Oral   SpO2: 100%  99% 100%  Weight:  70.8 kg  Height:  5' (1.524 m)       Body mass index is 30.47 kg/m.  Physical Exam   General: Laying comfortably in bed; in no acute distress.  HENT: Normal oropharynx and mucosa. Normal external appearance of ears and nose.  Neck: Supple, no pain or tenderness  CV: No JVD. No peripheral edema.  Pulmonary: Symmetric Chest rise. Normal respiratory effort.  Abdomen: Soft to touch, non-tender.  Ext: No cyanosis, edema, or deformity Skin: No rash. Normal palpation of skin.   Musculoskeletal: Normal digits and nails by inspection. No clubbing.   Neurologic Examination  Mental status/Cognition: Alert, oriented to self, place, month and year, good attention.  Speech/language: Fluent, comprehension intact, object naming intact, repetition intact.  Cranial nerves:   CN II Pupils equal and reactive to light, no VF deficits    CN III,IV,VI EOM intact, no gaze preference or deviation, no nystagmus    CN V normal sensation in V1, V2, and V3 segments bilaterally    CN VII no asymmetry, no nasolabial fold flattening    CN VIII normal hearing to speech    CN IX & X normal palatal elevation, no uvular deviation    CN XI 5/5 head turn and 5/5 shoulder shrug bilaterally    CN XII midline tongue protrusion    Motor:  Muscle bulk: normal, tone normal, pronator drift none tremor none Mvmt Root Nerve  Muscle Right Left Comments  SA C5/6 Ax Deltoid 5 5   EF C5/6 Mc Biceps 5 5   EE C6/7/8 Rad Triceps 5 5   WF C6/7 Med FCR     WE C7/8 PIN ECU     F Ab C8/T1 U ADM/FDI 5 5   HF L1/2/3 Fem Illopsoas 5 5   KE L2/3/4 Fem Quad 5 5   DF L4/5 D Peron Tib Ant 5 5   PF S1/2 Tibial Grc/Sol 5 5    Sensation:  Light touch Intact throughout   Pin prick    Temperature    Vibration   Proprioception    Coordination/Complex Motor:  - Finger to Nose intact bilaterally - Heel to shin intact bilaterally - Rapid alternating movement are normal - Gait: deferred.  Labs   CBC:  Recent Labs  Lab 09/25/22 1320   WBC 10.9*  NEUTROABS 6.5  HGB 13.8  HCT 42.8  MCV 90.5  PLT 241    Basic Metabolic Panel:  Lab Results  Component Value Date   NA 137 09/25/2022   K 4.4 09/25/2022   CO2 23 09/25/2022   GLUCOSE 114 (H) 09/25/2022   BUN 11 09/25/2022   CREATININE 0.83 09/25/2022   CALCIUM 9.3 09/25/2022   GFRNONAA >60 09/25/2022   GFRAA >60 11/02/2016   Lipid Panel:  Lab Results  Component Value Date   LDLCALC 72 04/19/2022   HgbA1c:  Lab Results  Component Value Date   HGBA1C 5.5 04/19/2022   Urine Drug Screen:     Component Value Date/Time   LABOPIA NONE DETECTED 04/30/2022 1217   COCAINSCRNUR NONE DETECTED 04/30/2022 1217   LABBENZ NONE DETECTED 04/30/2022 1217   AMPHETMU NONE DETECTED 04/30/2022 1217   THCU POSITIVE (A) 04/30/2022 1217   LABBARB NONE DETECTED 04/30/2022 1217    Alcohol Level     Component Value Date/Time   ETH <10 04/30/2022 1017    CT Head without contrast(Personally reviewed): CTH was negative for a large hypodensity concerning for a large territory infarct or hyperdensity concerning for an ICH  CT  angio Head and Neck with contrast(Personally reviewed): No LVO. 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  MRI Brain(Personally reviewed): Unable to get  Impression   JENIFFER CULLIVER is a 31 y.o. female with PMH significant for prior STDs, pyelonephritis, depression, anemia, cryptogenic strokes with no residual deficits who presents with episode of vertigo on Friday and another episode of aphasia over the weekend. With her prior hx of cryptogenic strokes, these episodes are concerning for TIAs.  Unable to get MRI due to weave in her head and is sown in but clinically suspicion for these episodes being TIA is high. She did get full workup back in June for cryptogenic strokes including TEE. She was unable to get an appointment with any neurologist and thus unable to follow  up. Review of labs ordered in June 2023 does show that she did have decreased protein S activity which is associated with increased risk of VTE. However, may be decreased in young females. Will defer to stroke team in AM to see if they think that this warrant treatment with anticoagulation.  TIA.  Recommendations  - Frequent Neuro checks per stroke unit protocol - Recommend repeat CTH in 24 hours. - Recommend obtaining TTE - Recommend obtaining Lipid panel with LDL - Please start statin if LDL > 70 - Recommend HbA1c - Antithrombotic - Aspirin 81mg  daily along with plavix 75mg  daily x 21days, followed by Aspirin 81mg  daily alone. - Recommend DVT ppx - SBP goal - permissive hypertension first 24 h < 220/110. Held home meds.  - Recommend Telemetry monitoring for arrythmia - Recommend bedside swallow screen prior to PO intake. - Stroke education booklet - Recommend PT/OT/SLP consult - counseled on the importance of cutting down and eventually quitting smoking to reduce risk of strokes in the future. - defer need for [facility] to the stroke team in AM, specially in the setting of elevated protein S. May consider discussing with Hem/onc team in AM too. ______________________________________________________________________   Thank you for the opportunity to take part in the care of this patient. If you have any further questions, please contact the neurology consultation attending.  Signed,  [facility] Pager Number _ _ _   _ __   _ __ _ _  __ __   _ __   __ _

## 2022-09-25 NOTE — ED Triage Notes (Signed)
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  Reports on Friday she felt like she had another stroke and since has been off balance, dizzy and with a headache.  Patient complains of pain in right hand and leg.  Did not have any residual from previous stroke.

## 2022-09-26 ENCOUNTER — Observation Stay (HOSPITAL_COMMUNITY): Payer: Medicaid Other

## 2022-09-26 ENCOUNTER — Observation Stay (HOSPITAL_BASED_OUTPATIENT_CLINIC_OR_DEPARTMENT_OTHER): Payer: Medicaid Other

## 2022-09-26 ENCOUNTER — Other Ambulatory Visit: Payer: Self-pay | Admitting: Physician Assistant

## 2022-09-26 DIAGNOSIS — R55 Syncope and collapse: Secondary | ICD-10-CM

## 2022-09-26 DIAGNOSIS — R52 Pain, unspecified: Secondary | ICD-10-CM | POA: Diagnosis not present

## 2022-09-26 DIAGNOSIS — G459 Transient cerebral ischemic attack, unspecified: Secondary | ICD-10-CM | POA: Diagnosis not present

## 2022-09-26 DIAGNOSIS — Z720 Tobacco use: Secondary | ICD-10-CM | POA: Insufficient documentation

## 2022-09-26 DIAGNOSIS — M79604 Pain in right leg: Secondary | ICD-10-CM | POA: Insufficient documentation

## 2022-09-26 DIAGNOSIS — R42 Dizziness and giddiness: Secondary | ICD-10-CM | POA: Diagnosis not present

## 2022-09-26 DIAGNOSIS — I4891 Unspecified atrial fibrillation: Secondary | ICD-10-CM

## 2022-09-26 LAB — ECHOCARDIOGRAM COMPLETE
Area-P 1/2: 3.31 cm2
Height: 60 in
S' Lateral: 3.54 cm
Weight: 2496 oz

## 2022-09-26 LAB — LIPID PANEL
Cholesterol: 155 mg/dL (ref 0–200)
HDL: 43 mg/dL (ref >40)
LDL Cholesterol: 85 mg/dL (ref 0–99)
Total CHOL/HDL Ratio: 3.6 RATIO
Triglycerides: 135 mg/dL (ref <150)
VLDL: 27 mg/dL (ref 0–40)

## 2022-09-26 LAB — VITAMIN B12: Vitamin B-12: 266 pg/mL (ref 180–914)

## 2022-09-26 LAB — HIV ANTIBODY (ROUTINE TESTING W REFLEX): HIV Screen 4th Generation wRfx: NONREACTIVE

## 2022-09-26 LAB — HEMOGLOBIN A1C
Hgb A1c MFr Bld: 5.4 % (ref 4.8–5.6)
Mean Plasma Glucose: 108.28 mg/dL

## 2022-09-26 MED ORDER — ACETAMINOPHEN 160 MG/5ML PO SOLN: 650.0000 mg | ORAL | Status: DC | PRN

## 2022-09-26 MED ORDER — ASPIRIN 81 MG PO CHEW: 81.0000 mg | CHEWABLE_TABLET | Freq: Every day | ORAL | 1 refills | Status: DC

## 2022-09-26 MED ORDER — STROKE: EARLY STAGES OF RECOVERY BOOK: Freq: Once | Status: DC

## 2022-09-26 MED ORDER — CLOPIDOGREL BISULFATE 75 MG PO TABS: 75.0000 mg | ORAL_TABLET | Freq: Every day | ORAL | 0 refills | Status: DC

## 2022-09-26 MED ORDER — ROSUVASTATIN CALCIUM 20 MG PO TABS: 20.0000 mg | ORAL_TABLET | Freq: Every day | ORAL | Status: DC

## 2022-09-26 MED ORDER — ROSUVASTATIN CALCIUM 10 MG PO TABS: 10.0000 mg | ORAL_TABLET | Freq: Every day | ORAL | 0 refills | Status: DC

## 2022-09-26 MED ORDER — CLOPIDOGREL BISULFATE 75 MG PO TABS
75.0000 mg | ORAL_TABLET | Freq: Every day | ORAL | Status: DC
  Administered 2022-09-26: 75 mg via ORAL
  Filled 2022-09-26: qty 1

## 2022-09-26 MED ORDER — ASPIRIN 81 MG PO CHEW
81.0000 mg | CHEWABLE_TABLET | Freq: Every day | ORAL | Status: DC
  Administered 2022-09-26: 81 mg via ORAL
  Filled 2022-09-26: qty 1

## 2022-09-26 MED ORDER — ACETAMINOPHEN 325 MG PO TABS: 650.0000 mg | ORAL_TABLET | ORAL | Status: DC | PRN

## 2022-09-26 MED ORDER — KETOROLAC TROMETHAMINE 15 MG/ML IJ SOLN
15.0000 mg | Freq: Once | INTRAMUSCULAR | Status: AC
  Administered 2022-09-26: 15 mg via INTRAVENOUS
  Filled 2022-09-26: qty 1

## 2022-09-26 MED ORDER — ENOXAPARIN SODIUM 40 MG/0.4ML IJ SOSY
40.0000 mg | PREFILLED_SYRINGE | Freq: Every day | INTRAMUSCULAR | Status: DC
  Filled 2022-09-26: qty 0.4

## 2022-09-26 MED ORDER — ACETAMINOPHEN 650 MG RE SUPP: 650.0000 mg | RECTAL | Status: DC | PRN

## 2022-09-26 NOTE — Assessment & Plan Note (Signed)
Ongoing use. Encourage cessation with hx of strokes

## 2022-09-26 NOTE — Assessment & Plan Note (Signed)
BMI of 30 

## 2022-09-26 NOTE — ED Notes (Addendum)
Breakfast ordered. Pt had specific requests on food and how she wanted it cooked. This nurse at bedside to order food for pt. Pt reporting she will go to cafeteria and get what she wants if she can't order her food. Pt educated on staying in room and attempting to get specific food to discourage pt from going down to cafeteria.

## 2022-09-26 NOTE — ED Notes (Signed)
Pt walked back into room.

## 2022-09-26 NOTE — ED Notes (Signed)
Patient ambulates to the lobby with a steady gait

## 2022-09-26 NOTE — Assessment & Plan Note (Signed)
-  reports bilateral LE sharp pain to her lower leg and knee intermittently with sharp pain. Also has paresthesia to one of her toes.  -bilateral venous doppler is pending to rule out DVT -also check vitamin B12

## 2022-09-26 NOTE — Evaluation (Signed)
Physical Therapy Evaluation Patient Details Name: Theresa [name] MRN: [id number] DOB: November 12, 1990 Today's Date: 09/26/2022  History of Present Illness  Pt is a 31 yo female presenting 11/20 with vertigo. CT Head negative. Not able to have MRI due to metallic artifact in her weaved in wig. PMH includes: cryptogenic strokes with no residual deficits, polysubstance use (tobacco, marijuana), STDs, pyelonephritis, depression, anemia  Clinical Impression  Pt admitted with above diagnosis. 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 Pt should progress well.  DGI 19/24 suggesting low risk of falls and pt is aware of risk and doesn't want a cane at present as she is careful and did not have significant LOB.  Will follow acutely.  Pt currently with functional limitations due to the deficits listed below (see PT Problem List). Pt will benefit from skilled PT to increase their independence and safety with mobility to allow discharge to the venue listed below.          Recommendations for follow up therapy are one component of a multi-disciplinary discharge planning process, led by the attending physician.  Recommendations may be updated based on patient status, additional functional criteria and insurance authorization.  Follow Up Recommendations Outpatient PT (vestibular rehab)      Assistance Recommended at Discharge PRN  Patient can return home with the following       Equipment Recommendations None recommended by PT  Recommendations for Other Services       Functional Status Assessment Patient has had a recent decline in their functional status and demonstrates the ability to make significant improvements in function in a reasonable and predictable amount of time.     Precautions / Restrictions Precautions Precautions: None Restrictions Weight Bearing Restrictions: No      Mobility  Bed  Mobility Overal bed mobility: Independent                  Transfers Overall transfer level: Independent                      Ambulation/Gait Ambulation/Gait assistance: Supervision, Min guard Gait Distance (Feet): 450 Feet Assistive device: None Gait Pattern/deviations: WFL(Within Functional Limits)   Gait velocity interpretation: 1.31 - 2.62 ft/sec, indicative of limited community ambulator   General Gait Details: Pt able to withstand challenges to balance.  no LOB with min challenges and self corrects even with mod challenges  Stairs Stairs: Yes Stairs assistance: Min guard Stair Management: Step to pattern, Forwards, One rail Right Number of Stairs: 3    Wheelchair Mobility    Modified Rankin (Stroke Patients Only) Modified Rankin (Stroke Patients Only) Pre-Morbid Rankin Score: No significant disability Modified Rankin: Moderate disability     Balance                                 Standardized Balance Assessment Standardized Balance Assessment : Dynamic Gait Index   Dynamic Gait Index Level Surface: Normal Change in Gait Speed: Mild Impairment Gait with Horizontal Head Turns: Mild Impairment Gait with Vertical Head Turns: Normal Gait and Pivot Turn: Mild Impairment Step Over Obstacle: Normal Step Around Obstacles: Normal Steps: Moderate Impairment Total Score: 19       Pertinent Vitals/Pain Pain Assessment Pain Assessment: Faces Faces Pain Scale: Hurts even more Pain Location: bil  LEs Pain Descriptors / Indicators: Aching,  Discomfort, Grimacing, Guarding Pain Intervention(s): Limited activity within patient's tolerance, Monitored during session, Repositioned    Home Living Family/patient expects to be discharged to:: Private residence Living Arrangements: Children Available Help at Discharge: Family;Available 24 hours/day (planning to go to sister's house initially) Type of Home: Apartment Home Access: Level  entry     Alternate Level Stairs-Number of Steps: 12 Home Layout: Two level;1/2 bath on main level;Bed/bath upstairs Home Equipment: None 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    Prior Function Prior Level of Function : Independent/Modified Independent;Driving;Working/employed                     Hand Dominance   Dominant Hand: Right    Extremity/Trunk Assessment   Upper Extremity Assessment Upper Extremity Assessment: Defer to OT evaluation    Lower Extremity Assessment Lower Extremity Assessment: Overall WFL for tasks assessed    Cervical / Trunk Assessment Cervical / Trunk Assessment: Normal  Communication   Communication: No difficulties  Cognition Arousal/Alertness: Awake/alert Behavior During Therapy: WFL for tasks assessed/performed Overall Cognitive Status: Impaired/Different from baseline (impaired since stroke several months agio) Area of Impairment: Memory                     Memory: Decreased short-term memory, Decreased recall of precautions                  General Comments General comments (skin integrity, edema, etc.): 64 bpm, 99%RA, 112/63 supine; 74 bpm 109/60 sitting; 101/65 86 bpm standing.  Pt with negative BPPV testing.  Positive for right vestibular hypofunction.    Exercises Other Exercises Other Exercises: x1 exercises initiated. Handouts given   Assessment/Plan    PT Assessment Patient needs continued PT services  PT Problem List Decreased activity tolerance;Decreased balance;Decreased mobility;Decreased knowledge of use of DME;Decreased safety awareness (gaze stability issues)       PT Treatment Interventions DME instruction;Gait training;Functional mobility training;Therapeutic activities;Stair training;Therapeutic exercise;Balance training;Patient/family education (gaze stability)    PT Goals (Current goals can be found in the Care Plan section)  Acute Rehab PT Goals Patient Stated Goal: to go  home PT Goal Formulation: With patient Time For Goal Achievement: 10/10/22 Potential to Achieve Goals: Good    Frequency Min 3X/week     Co-evaluation               AM-PAC PT "6 Clicks" Mobility  Outcome Measure Help needed turning from your back to your side while in a flat bed without using bedrails?: None Help needed moving from lying on your back to sitting on the side of a flat bed without using bedrails?: None Help needed moving to and from a bed to a chair (including a wheelchair)?: A Little Help needed standing up from a chair using your arms (e.g., wheelchair or bedside chair)?: A Little Help needed to walk in hospital room?: A Little Help needed climbing 3-5 steps with a railing? : A Lot 6 Click Score: 19    End of Session Equipment Utilized During Treatment: Gait belt Activity Tolerance: Patient tolerated treatment well Patient left: with call bell/phone within reach (on stretcher) Nurse Communication: Mobility status PT Visit Diagnosis: Muscle weakness (generalized) (M62.81);Dizziness and giddiness (R42)    Time: 1610-9604 PT Time Calculation (min) (ACUTE ONLY): 34 min   Charges:   PT Evaluation $PT Eval Moderate Complexity: 1 Mod PT Treatments $Therapeutic Exercise: 8-22 mins        Jenesis Martin M,PT Acute Rehab Services [phone]  Bevelyn Buckles 09/26/2022, 11:22 AM

## 2022-09-26 NOTE — Discharge Instructions (Signed)

## 2022-09-26 NOTE — Progress Notes (Addendum)
STROKE TEAM PROGRESS NOTE   INTERVAL HISTORY Patient seen in her room with no family at the bedside.  On Friday, she experienced acute onset vertigo which lasted about 45 minutes.  She had intermittent vertigo over the weekend as well as an episode of word finding difficulty which resolved spontaneously.  The symptoms are similar to patient's prior strokes.  She was brought to the ED for stroke work-up, but MRI could not be performed due to a hair weave which could not be taken out.  Patient reports palpitations and will need to have an outpatient event monitor as well as a loop recorder likely implanted to investigate for A-fib, as she has a history of cryptogenic strokes.  Vitals:   09/26/22 1000 09/26/22 1400 09/26/22 1445 09/26/22 1500  BP: 103/70 108/65  101/71  Pulse: 63 75 73   Resp:      Temp:      TempSrc:      SpO2: 100% 100% 99%   Weight:      Height:       CBC:  Recent Labs  Lab 09/25/22 1320  WBC 10.9*  NEUTROABS 6.5  HGB 13.8  HCT 42.8  MCV 90.5  PLT 241   Basic Metabolic Panel:  Recent Labs  Lab 09/25/22 1320  NA 137  K 4.4  CL 103  CO2 23  GLUCOSE 114*  BUN 11  CREATININE 0.83  CALCIUM 9.3   Lipid Panel:  Recent Labs  Lab 09/26/22 0430  CHOL 155  TRIG 135  HDL 43  CHOLHDL 3.6  VLDL 27  LDLCALC 85   HgbA1c:  Recent Labs  Lab 09/26/22 0430  HGBA1C 5.4   Urine Drug Screen: No results for input(s): "LABOPIA", "COCAINSCRNUR", "LABBENZ", "AMPHETMU", "THCU", "LABBARB" in the last 168 hours.  Alcohol Level No results for input(s): "ETH" in the last 168 hours.  IMAGING past 24 hours ECHOCARDIOGRAM COMPLETE  Result Date: 09/26/2022    ECHOCARDIOGRAM REPORT   Patient Name:   Theresa [name] Date of Exam: 09/26/2022 Medical Rec #:  [id number]      Height:       60.0 in Accession #:    [id number]     Weight:       156.0 lb Date of Birth:  25-Mar-1991      BSA:          1.680 m Patient Age:    31 years       BP:           103/70 mmHg Patient Gender: F               HR:           61 bpm. Exam Location:  Inpatient Procedure: 2D Echo, 3D Echo, Cardiac Doppler and Color Doppler Indications:    Syncope R55  History:        Patient has prior history of Echocardiogram examinations, most                 recent 05/02/2022.  Sonographer:    Leta Jungling RDCS Referring Phys: [id number] CHING T TU IMPRESSIONS  1. Left ventricular ejection fraction, by estimation, is 60 to 65%. Left ventricular ejection fraction by 3D volume is 63 %. The left ventricle has normal function. The left ventricle has no regional wall motion abnormalities. Left ventricular diastolic  parameters were normal.  2. Right ventricular systolic function is normal. The right ventricular size is normal.  3. The mitral valve is normal  in structure. No evidence of mitral valve regurgitation. No evidence of mitral stenosis.  4. The aortic valve is tricuspid. Aortic valve regurgitation is not visualized. No aortic stenosis is present. Comparison(s): No significant change from prior study. Conclusion(s)/Recommendation(s): Normal biventricular function without evidence of hemodynamically significant valvular heart disease. FINDINGS  Left Ventricle: Left ventricular ejection fraction, by estimation, is 60 to 65%. Left ventricular ejection fraction by 3D volume is 63 %. The left ventricle has normal function. The left ventricle has no regional wall motion abnormalities. The left ventricular internal cavity size was normal in size. There is no left ventricular hypertrophy. Left ventricular diastolic parameters were normal. Right Ventricle: The right ventricular size is normal. No increase in right ventricular wall thickness. Right ventricular systolic function is normal. Left Atrium: Left atrial size was normal in size. Right Atrium: Right atrial size was normal in size. Pericardium: There is no evidence of pericardial effusion. Mitral Valve: The mitral valve is normal in structure. No evidence of mitral valve  regurgitation. No evidence of mitral valve stenosis. Tricuspid Valve: The tricuspid valve is normal in structure. Tricuspid valve regurgitation is not demonstrated. No evidence of tricuspid stenosis. Aortic Valve: The aortic valve is tricuspid. Aortic valve regurgitation is not visualized. No aortic stenosis is present. Pulmonic Valve: The pulmonic valve was normal in structure. Pulmonic valve regurgitation is not visualized. No evidence of pulmonic stenosis. Aorta: The aortic root, ascending aorta and aortic arch are all structurally normal, with no evidence of dilitation or obstruction. IAS/Shunts: The atrial septum is grossly normal.  LEFT VENTRICLE PLAX 2D LVIDd:         4.32 cm         Diastology LVIDs:         3.54 cm         LV e' medial:    8.92 cm/s LV PW:         0.78 cm         LV E/e' medial:  6.2 LV IVS:        1.01 cm         LV e' lateral:   8.92 cm/s LVOT diam:     2.10 cm         LV E/e' lateral: 6.2 LV SV:         59 LV SV Index:   35 LVOT Area:     3.46 cm        3D Volume EF                                LV 3D EF:    Left                                             ventricul                                             ar                                             ejection  fraction                                             by 3D                                             volume is                                             63 %.                                 3D Volume EF:                                3D EF:        63 %                                LV EDV:       83 ml                                LV ESV:       31 ml                                LV SV:        52 ml RIGHT VENTRICLE RV S prime:     12.50 cm/s TAPSE (M-mode): 1.8 cm LEFT ATRIUM             Index       RIGHT ATRIUM          Index LA diam:        2.50 cm 1.49 cm/m  RA Area:     9.81 cm LA Vol (A2C):   11.4 ml 6.79 ml/m  RA Volume:   16.20 ml 9.65 ml/m LA Vol (A4C):   15.4  ml 9.17 ml/m LA Biplane Vol: 13.6 ml 8.10 ml/m  AORTIC VALVE LVOT Vmax:   81.30 cm/s LVOT Vmean:  61.900 cm/s LVOT VTI:    0.170 m  AORTA Ao Root diam: 2.70 cm Ao Asc diam:  2.50 cm MITRAL VALVE MV Area (PHT): 3.31 cm    SHUNTS MV Decel Time: 229 msec    Systemic VTI:  0.17 m MV E velocity: 55.40 cm/s  Systemic Diam: 2.10 cm MV A velocity: 39.20 cm/s MV E/A ratio:  1.41 Riley Lam MD Electronically signed by Riley Lam MD Signature Date/Time: 09/26/2022/12:31:55 PM    Final    VAS [place] LOWER EXTREMITY VENOUS (DVT) (ONLY [facility] & [facility])  Result Date: 09/26/2022  Lower Venous DVT Study Patient Name:  Theresa [name]  Date of Exam:   09/26/2022 Medical Rec #: [id number]       Accession #:    [id number] Date of Birth: August 02, 1991  Patient Gender: F Patient Age:   [age] years [facility] Procedure:      VAS US LOWER EXTREMITY VENOUS (DVT) Referring Phys: Molly MaduroOBERT BROWNING --------------------------------------------------------------------------------  Indications: Pain.  Comparison Study: Previous exam on 04/19/22 was negative for DVT Performing Technologist: Ernestene MentionJody Hill RVT, RDMS  Examination Guidelines: A complete evaluation includes B-mode imaging, spectral Doppler, color Doppler, and power Doppler as needed of all accessible portions of each vessel. Bilateral testing is considered an integral part of a complete examination. Limited examinations for reoccurring indications may be performed as noted. The reflux portion of the exam is performed with the patient in reverse Trendelenburg.  +---------+---------------+---------+-----------+----------+--------------+ RIGHT    CompressibilityPhasicitySpontaneityPropertiesThrombus Aging +---------+---------------+---------+-----------+----------+--------------+ CFV      Full           Yes      Yes                                 +---------+---------------+---------+-----------+----------+--------------+ SFJ      Full                                                         +---------+---------------+---------+-----------+----------+--------------+ FV Prox  Full           Yes      Yes                                 +---------+---------------+---------+-----------+----------+--------------+ FV Mid   Full           Yes      Yes                                 +---------+---------------+---------+-----------+----------+--------------+ FV DistalFull           Yes      Yes                                 +---------+---------------+---------+-----------+----------+--------------+ PFV      Full                                                        +---------+---------------+---------+-----------+----------+--------------+ POP      Full           Yes      Yes                                 +---------+---------------+---------+-----------+----------+--------------+ PTV      Full                                                        +---------+---------------+---------+-----------+----------+--------------+ PERO     Full                                                        +---------+---------------+---------+-----------+----------+--------------+   +---------+---------------+---------+-----------+----------+--------------+  LEFT     CompressibilityPhasicitySpontaneityPropertiesThrombus Aging +---------+---------------+---------+-----------+----------+--------------+ CFV      Full           Yes      Yes                                 +---------+---------------+---------+-----------+----------+--------------+ SFJ      Full                                                        +---------+---------------+---------+-----------+----------+--------------+ FV Prox  Full           Yes      Yes                                 +---------+---------------+---------+-----------+----------+--------------+ FV Mid   Full           Yes      Yes                                  +---------+---------------+---------+-----------+----------+--------------+ FV DistalFull           Yes      Yes                                 +---------+---------------+---------+-----------+----------+--------------+ PFV      Full                                                        +---------+---------------+---------+-----------+----------+--------------+ POP      Full           Yes      Yes                                 +---------+---------------+---------+-----------+----------+--------------+ PTV      Full                                                        +---------+---------------+---------+-----------+----------+--------------+ PERO     Full                                                        +---------+---------------+---------+-----------+----------+--------------+    Summary: BILATERAL: - No evidence of deep vein thrombosis seen in the lower extremities, bilaterally. -No evidence of popliteal cyst, bilaterally.   *See table(s) above for measurements and observations.    Preliminary    CT HEAD WO CONTRAST  Result Date: 09/26/2022 CLINICAL DATA:  Syncope/presyncope, cerebrovascular cause suspected EXAM: CT HEAD WITHOUT CONTRAST TECHNIQUE: Contiguous axial images were  obtained from the base of the skull through the vertex without intravenous contrast. RADIATION DOSE REDUCTION: This exam was performed according to the departmental dose-optimization program which includes automated exposure control, adjustment of the mA and/or kV according to patient size and/or use of iterative reconstruction technique. COMPARISON:  09/25/2022 FINDINGS: Brain: No acute intracranial abnormality. Specifically, no hemorrhage, hydrocephalus, mass lesion, acute infarction, or significant intracranial injury. Vascular: No hyperdense vessel or unexpected calcification. Skull: No acute calvarial abnormality. Sinuses/Orbits: No acute findings Other: None IMPRESSION: No acute  intracranial abnormality. Electronically Signed   By: Charlett Nose M.D.   On: 09/26/2022 03:04   CT ANGIO HEAD NECK W WO CM  Result Date: 09/25/2022 CLINICAL DATA:  Transient ischemic attack (TIA) EXAM: CT ANGIOGRAPHY HEAD AND NECK TECHNIQUE: Multidetector CT imaging of the head and neck was performed using the standard protocol during bolus administration of intravenous contrast. Multiplanar CT image reconstructions and MIPs were obtained to evaluate the vascular anatomy. Carotid stenosis measurements (when applicable) are obtained utilizing NASCET criteria, using the distal internal carotid diameter as the denominator. RADIATION DOSE REDUCTION: This exam was performed according to the departmental dose-optimization program which includes automated exposure control, adjustment of the mA and/or kV according to patient size and/or use of iterative reconstruction technique. CONTRAST:  75mL OMNIPAQUE IOHEXOL 350 MG/ML SOLN COMPARISON:  Same day CT head.  CTA June 14 23. FINDINGS: CTA NECK FINDINGS Aortic arch: Great vessel origins are patent without significant stenosis. Right carotid system: No evidence of dissection, stenosis (50% or greater), or occlusion. Left carotid system: No evidence of dissection, stenosis (50% or greater), or occlusion. 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 The V2 vertebral arteries is now opacified. Diminutive intradural segment with suspected superimposed stenosis and nonopacification. Right vertebral artery is patent without significant stenosis. Skeleton: No acute findings. Other neck: No acute findings. Upper chest: Visualized lung apices are clear. Paraseptal emphysema. Review of the MIP images confirms the above findings CTA HEAD FINDINGS Anterior circulation: Bilateral intracranial ICAs, MCAs, and ACAs are patent without proximal hemodynamically significant stenosis. Small  (1-2 mm) posteriorly directed outpouching arising from the right supraclinoid ICA (series 7, image 108) Posterior circulation: Small/non dominant left intradural vertebral artery with suspected stenosis of the intradural segment which is not opacified. Right intradural vertebral artery the basilar artery and bilateral posterior cerebral arteries are patent without proximal hemodynamically significant stenosis. Venous sinuses: Poorly evaluated due to arterial timing. Review of the MIP images confirms the above findings IMPRESSION: 1. No new/emergent large vessel occlusion. 2. 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 The V2 vertebral arteries is now opacified. Diminutive intradural segment with suspected superimposed stenosis and nonopacification. 3. Small (1-2 mm) posteriorly directed outpouching arising from the right supraclinoid ICA, which could represent an infundibulum or aneurysm. 4. Emphysema (ICD10-J43.9). Electronically Signed   By: Feliberto Harts M.D.   On: 09/25/2022 19:00    PHYSICAL EXAM General: Alert, well-developed, well-nourished patient in no acute distress Respiratory: Regular, unlabored respirations on room air  NEURO:  Mental Status: AA&Ox3  Speech/Language: speech is without dysarthria or aphasia.   Cranial Nerves:  II: PERRL. Visual fields full.  III, IV, VI: EOMI. Eyelids elevate symmetrically.  V: Sensation is intact to light touch and symmetrical to face.  VII: Smile is symmetrical.   VIII: hearing intact to voice. IX, X: Phonation is normal.  XII: tongue is midline without fasciculations. Motor: 5/5 strength to all muscle groups tested.  Tone: is normal and bulk is normal Sensation- Intact to light touch bilaterally.   Coordination: FTN intact bilaterally Gait- deferred   ASSESSMENT/PLAN Theresa [name] is a 31 y.o. 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  She had intermittent vertigo over the weekend as well as an episode of word finding difficulty which resolved spontaneously.  The symptoms are similar to patient's prior strokes.  She was brought to the ED for stroke work-up, but MRI could not be performed due to a hair weave which could not be taken out.  Patient reports palpitations and will need to have an outpatient event monitor as well as a loop recorder likely implanted to investigate for A-fib, as she has a history of cryptogenic strokes.  Likely TIA in patient with current cryptogenic strokes CT head No acute abnormality.  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 Yes; Fluid consistency: Thin   aspirin 325 mg daily and clopidogrel 75 mg daily prior to admission, now on aspirin 81 mg daily and clopidogrel 75 mg daily.  Therapy recommendations: Outpatient PT and home health SLP Disposition: Home  Hypertension Home meds: None Stable Keep systolic blood pressure less than 180 Long-term BP goal normotensive  Hyperlipidemia Home meds: Rosuvastatin 10 mg daily, increase to 20 mg LDL 85, goal < 70 Continue statin at discharge   Other Stroke Risk Factors Cigarette smoker advised to stop smoking Substance abuse - UDS:  THC POSITIVE, Cocaine NONE DETECTED. Patient advised to stop using due to stroke risk. Obesity, Body mass index is 30.47 kg/m., BMI >/= 30 associated with increased stroke risk, recommend weight loss, diet and exercise as appropriate  Hx stroke  Other Active Problems None  Hospital  day # 0  Cortney E Ernestina Columbia , MSN, AGACNP-BC [facility] See Amion for schedule and pager information 09/26/2022 3:29 PM  ATTENDING ATTESTATION:  31 year old with history of strokes poorly compliant and recent stroke work-up in June with hypercoagulable work-up completed.  She failed to follow-up outpatient with stroke clinic.  Recommend loop monitor outpatient.  Counseled heavily today on tobacco cessation and marijuana cessation.  She understands the risk of recurrent stroke.  Continue DAPT therapy for now and highly encouraged to follow-up outpatient with neurology as well as cardiology for loop monitor.  Patient did not want to stay in the hospital be admitted for the monitor.  Her exam is unremarkable with and a stroke scale of 0.  Dr. Viviann Spare evaluated pt independently, reviewed imaging, chart, labs. Discussed and formulated plan with the Resident/APP. Changes were made to the note where appropriate. Please see APP/resident note above for details.  Jorrell Kuster,MD     To contact Stroke Continuity provider, please refer to [email or web address]. After hours, contact General Neurology

## 2022-09-26 NOTE — Progress Notes (Signed)
  Echocardiogram 2D Echocardiogram has been performed.  Leta Jungling M 09/26/2022, 11:33 AM

## 2022-09-26 NOTE — ED Notes (Signed)
Lunch tray delivered.

## 2022-09-26 NOTE — ED Notes (Signed)
Pt calling to ask for "discharge paperwork". Don't see any orders for discharge at this time.

## 2022-09-26 NOTE — ED Notes (Signed)
Pt called RN into room to ask "when am I going to get a bed and when is my surgery", explained to pt that we don't know when she will be getting a bed until the bed pops up on the computer. Also explained that the MD placed a diet order today and I was not aware of any surgery planned. Pt states "I didn't eat any of this food so I can still have surgery".

## 2022-09-26 NOTE — H&P (Signed)
History and Physical    Patient: Theresa [name] ZOX:[id number] DOB: [birth year] DOA: 09/25/2022 DOS: the patient was seen and examined on 09/26/2022 PCP: Patient, No Pcp Per  Patient coming from: Home  Chief Complaint:  Chief Complaint  Patient presents with   Neurologic Problem   HPI: KYNSLEE BAHAM is a 31 y.o. 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   Symptoms started 4 days ago. She was driving and had acute dizziness and had to pull over. She had trouble walking when she got out of the car. Episode lasted 45 mins and resolved on its own. Since then she has had intermittent right sided sharp throbbing headache and vertigo. Today when she got out of bed she was losing balance causing her to present to ED. She reports compliance with her medication although she cannot name them. She has not followed up with anyone since her last stroke in June.  She also complaints of intermittent bilateral lower extremity sharp pain for the past few days. Also has numbness of a toe in her right foot. No edema or definitive calf pain. Reports tobacco use, occasional alcohol use and denies illcit drug use.  In the ED, temperature 99 F, BP of 110/66.  WBC of 10.9, hemoglobin of 13.8.  Sodium of 137, K4.4, CBG of 114.  Normal LFTs.  hCG is less than 5.  CT head is negative for acute findings.   CTA head and neck with no LVO. Improved opacification of left vertebral artery.  Review of Systems: As mentioned in the history of present illness. All other systems reviewed and are negative. Past Medical History:  Diagnosis Date   Anemia    Chlamydia    Depression    Eczema    Gonorrhea    Pyelonephritis    X 2   Past Surgical History:  Procedure Laterality Date   BUBBLE STUDY  05/02/2022   Procedure: BUBBLE STUDY;  Surgeon: Chrystie Nose, MD;  Location: [facility] ENDOSCOPY;  Service: Cardiovascular;;    CESAREAN SECTION N/A 05/20/2017   Procedure: CESAREAN SECTION;  Surgeon: Lazaro Arms, MD;  Location: [facility] BIRTHING SUITES;  Service: Obstetrics;  Laterality: N/A;   NO PAST SURGERIES     TEE WITHOUT CARDIOVERSION N/A 05/02/2022   Procedure: TRANSESOPHAGEAL ECHOCARDIOGRAM (TEE);  Surgeon: Chrystie Nose, MD;  Location: [facility] ENDOSCOPY;  Service: Cardiovascular;  Laterality: N/A;   TUBAL LIGATION Bilateral 05/20/2017   Procedure: BILATERAL TUBAL LIGATION;  Surgeon: Lazaro Arms, MD;  Location: [facility] BIRTHING SUITES;  Service: Obstetrics;  Laterality: Bilateral;   Social History:  reports that she has been smoking cigars. She quit smokeless tobacco use about 3 years ago. She reports that she does not currently use alcohol. She reports that she does not currently use drugs after having used the following drugs: Marijuana.  Allergies  Allergen Reactions   Latex Itching, Swelling and Other (See Comments)    Reaction:  Localized swelling    Family History  Problem Relation Age of Onset   Diabetes Mother    Asthma Daughter    Mayford Knife syndrome Son    Birth defects Neg Hx    Stroke Neg Hx    Hypertension Neg Hx    Anesthesia problems Neg Hx    Hypotension Neg Hx    Malignant hyperthermia Neg Hx    Pseudochol deficiency Neg Hx   No family hx of stroke or hypercoagulable disease  Prior  to Admission medications   Medication Sig Start Date End Date Taking? Authorizing Provider  aspirin EC 325 MG tablet Take 1 tablet (325 mg total) by mouth daily. 05/03/22   Rhetta Mura, MD  clopidogrel (PLAVIX) 75 MG tablet Take 1 tablet (75 mg total) by mouth daily. 05/03/22   Rhetta Mura, MD  meclizine (ANTIVERT) 25 MG tablet Take 1 tablet (25 mg total) by mouth 3 (three) times daily as needed for dizziness. 05/02/22   Rhetta Mura, MD  rosuvastatin (CRESTOR) 10 MG tablet Take 1 tablet (10 mg total) by mouth daily. 05/03/22   Rhetta Mura, MD    Physical Exam: Vitals:   09/25/22  1245 09/25/22 1610 09/25/22 1855 09/25/22 2354  BP:  106/62 104/64 108/65  Pulse:  75 70 66  Resp:  20 16 18   Temp:  98.8 F (37.1 C) 98.3 F (36.8 C) 98.2 F (36.8 C)  TempSrc:  Oral  Oral  SpO2:  99% 100% 100%  Weight: 70.8 kg     Height: 5' (1.524 m)      Constitutional: NAD, calm, comfortable, young female sitting upright in bed Eyes: lids and conjunctivae normal ENMT: Mucous membranes are moist.  Neck: normal, supple Respiratory: clear to auscultation bilaterally, no wheezing, no crackles. Normal respiratory effort. No accessory muscle use.  Cardiovascular: Regular rate and rhythm, no murmurs / rubs / gallops. No extremity edema.  Abdomen: soft, no tenderness,  Bowel sounds positive.  Musculoskeletal: no clubbing / cyanosis. No joint deformity upper and lower extremities. Good ROM, no contractures. Normal muscle tone.  Skin: no rashes, lesions, ulcers. No induration Neurologic: CN 2-12 grossly intact.  Strength 5/5 in all 4. Intact heel to shin.  Psychiatric: Normal judgment and insight. Alert and oriented x 3. Normal mood.  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  -initial CT head is negative -CTA head and neck with no LVO. Improved opacification of left vertebral artery.  -She could not undergo MRI of the brain due to potential metallic artifact in her weaved in wig  -neurology recommends repeat CT head in 24 hours -continue aspirin 81mg  with plavix 75mg  x21 days and then followed by aspirin 81mg  alone -previous hypercoagulable lab work also revealing for elevated protein S and neurology has deferred to morning stroke team to discuss need for anticoagulation. Might also need to consider hematology consult. - obtain 2D echocardiogram  -Obtain A1c and lipids -PT/OT/SLT -Frequent neuro checks and keep on  telemetry -Allow for permissive hypertension with blood pressure treatment as needed only if systolic goes above 220  Obesity (BMI 30-39.9) BMI of 30  Bilateral lower extremity pain -reports bilateral LE sharp pain to her lower leg and knee intermittently with sharp pain. Also has paresthesia to one of her toes.  -bilateral venous doppler is pending to rule out DVT -also check vitamin B12  Tobacco use Ongoing use. Encourage cessation with hx of strokes      Advance Care Planning:   Code Status: Full Code   Consults: Neurology  Family Communication: none at bedside  Severity of Illness: The appropriate patient status for this patient is OBSERVATION. Observation status is judged to be reasonable and necessary in order to provide the required intensity of service to ensure the patient's safety. The patient's presenting symptoms, physical exam findings, and initial radiographic and laboratory data in the context  of their medical condition is felt to place them at decreased risk for further clinical deterioration. Furthermore, it is anticipated that the patient will be medically stable for discharge from the hospital within 2 midnights of admission.   Author: Anselm Jungling, DO 09/26/2022 1:59 AM  For on call review [email or web address].

## 2022-09-26 NOTE — ED Notes (Signed)
Patient reports that she wants to leave and requesting d/c paperwork Provider Hongalgi notified and verbalizes that he is putting in d/c papers with resources. Patient reports that she is stepping out to smoke and wants d/c paperwork. This RN suggests that the patient stay in the hospital room to await treatment. Patient reports that if she can get her d/c papers she will not have to sign out ama. Situation shared with Dr Waymon Amato

## 2022-09-26 NOTE — Assessment & Plan Note (Addendum)
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  -initial CT head is negative -CTA head and neck with no LVO. Improved opacification of left vertebral artery.  -She could not undergo MRI of the brain due to potential metallic artifact in her weaved in wig  -neurology recommends repeat CT head in 24 hours -continue aspirin 81mg  with plavix 75mg  x21 days and then followed by aspirin 81mg  alone -previous hypercoagulable lab work also revealing for elevated protein S and neurology has deferred to morning stroke team to discuss need for anticoagulation. Might also need to consider hematology consult. - obtain 2D echocardiogram  -Obtain A1c and lipids -PT/OT/SLT -Frequent neuro checks and keep on telemetry -Allow for permissive hypertension with blood pressure treatment as needed only if systolic goes above 220

## 2022-09-26 NOTE — ED Notes (Signed)
Patient transported to vascular. 

## 2022-09-26 NOTE — Evaluation (Signed)
OT Cancellation Note  Patient Details Name: Theresa [name] MRN: [id number] DOB: 1991/01/22   Cancelled Treatment:    Reason Eval/Treat Not Completed: OT screened, no needs identified, will sign off Per PT, pt moving well with no formal OT eval needed at this time.  Lorre Munroe 09/26/2022, 9:56 AM

## 2022-09-26 NOTE — Discharge Summary (Signed)
Physician Discharge Summary  Theresa [name] ZOX:[id number] DOB: 1991-09-09  PCP: Patient, No Pcp Per  Admitted from: Home Discharged to: Home  Admit date: 09/25/2022 Discharge date: 09/26/2022  Recommendations for Outpatient Follow-up:    Follow-up Information     Mealor, Roberts Gaudy, MD Follow up.   Specialty: Cardiology Why: You will be getting a heart monitor in the mail to wear, and have an appointment to see Dr. Nelly Laurence 11/01/22 to review the findings and discuss further monitoring options (loop implant) if needed. Please call the office if you have any questions Contact information: [address] [place] [address] [phone]         Family physician. Schedule an appointment as soon as possible for a visit in 2 week(s).   Why: To be seen with repeat labs (CBC & BMP).  Recommend outpatient follow-up with neurology.                 Home Health: Outpatient PT/vestibular rehab and SLP    Equipment/Devices: None    Discharge Condition: Improved and stable.   Code Status: Full Code Diet recommendation:  Discharge Diet Orders (From admission, onward)     Start     Ordered   09/26/22 0000  Diet - low sodium heart healthy        09/26/22 1529             Discharge Diagnoses:  Principal Problem:   Dizziness Active Problems:   Obesity (BMI 30-39.9)   Tobacco use   Bilateral lower extremity pain   Brief Summary: 31 year old female, works as a Investment banker, operational at [employer], lives with her children and is independent, PMH of prior STDs, pyelonephritis, depression, anemia, cryptogenic stroke with no residual deficits, ongoing tobacco abuse who presented to the ED on 09/25/2022 with episode of vertigo.  Patient reported that she was driving on Friday PTA but out of nowhere she had vertigo and had to stop at the side of the road.  When she got out of the car, she noted that she was very off balance and was unable to walk.  The episode lasted approximately 45  minutes and resolved by itself.  Subsequently over the weekend, she had intermittent vertigo and also reported an episode of word finding difficulty that self resolved.  She reported that the vertigo felt similar to her prior stroke back in June.  She was worried about it and came to the ED for further evaluation and workup.  In the ED, CT head without contrast was negative for acute intracranial abnormalities.  She was unable to get an MRI of her brain because of weave tied into her head that is sewn into her head and unable to take out in the hospital.  Neurology consulted and noted that her episodes were concerning for TIAs.  She was admitted for further evaluation.  Assessment and plan:  Suspected TIA/vertigo: CT head without contrast 11/20: No acute findings are seen in non contrast CT brain.  Small old lacunar infarct seen in left cerebellum. She was unable to get an MRI of her brain because of weave tied into her head that is sewn into her head and unable to take out in the hospital.  CTA head and neck: No new/emergent large vessel occlusion. 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 The V2 vertebral arteries is now opacified. Diminutive intradural segment with suspected superimposed stenosis and nonopacification. Small (1-2  mm) posteriorly directed outpouching arising from the right supraclinoid ICA, which could represent an infundibulum or aneurysm.  Neurology was consulted and with her prior history of cryptogenic strokes, they were concerned that the episodes of vertigo and aphasia were TIAs.  Their clinical suspicion for these episodes being TIA was high.  She had completed a full workup back in June for cryptogenic strokes including TEE.  She reportedly was unable to get an appointment with a neurologist and thus unable to follow-up.  During that admission, she had decreased protein S activity which is  associated with increased risk of VTE.  Stroke team did not make any specific recommendations regarding anticoagulation.  Since she could not do an MRI, CT head was repeated which showed no acute intracranial abnormality.  TTE showed LVEF of 60-65%.  No significant change from prior study.  She had complained of intermittent bilateral lower extremity sharp pain for the past few days and lower extremity venous Dopplers were negative for DVT.  LDL was 85 but patient noncompliant with her statins.  A1c 5.4.  I-STAT hCG <5.  Therapies have evaluated and recommended outpatient PT/vestibular rehab and home health SLP.  Cardiology had been consulted for placement of loop recorder but unable to do this since patient is in observation status and still in the ED.  Thereby they plan to follow-up outpatient with a heart monitor.  As per neurology initial consultation note and as per my discussion with the stroke MD Dr. Leticia Penna, cleared for discharge on DAPT, aspirin 81 Mg daily along with Plavix 75 Mg daily x 21 days followed by aspirin 81 Mg daily alone.  Telemetry showed sinus rhythm without arrhythmias.  She passed bedside swallow screen and was tolerating diet.  Tobacco cessation was counseled but even while in the ED, she was threatening to leave AMA prior to her DC papers being completed and stepped out of the ED to smoke.  Placed ambulatory referral to neurology.  Outpatient neurology to further evaluate and decide regarding the decreased protein S activity.  Could consider outpatient hematology consultation.  Symptoms resolved.  Tobacco abuse/polysubstance abuse: Volunteered to admitting MD that apart from tobacco use, she occasionally uses alcohol.  Prior history of marijuana use.  Cessation counseled.   Hyperlipidemia: Patient has been noncompliant with her antiplatelet medications and statins.  She has not taken any of these this month.  Continue prior home dose of statins.  Bilateral lower extremity  pain: Unclear etiology.  Good peripheral pulses.  DVT workup as above negative.  B12 level: Low normal.  Could consider replacement as outpatient.  HIV screen pending.  This can be followed up as outpatient.  Body mass index is 30.47 kg/m./Obesity    Consultations: Neurology/stroke MD  Procedures: None   Discharge Instructions  Discharge Instructions     Ambulatory referral to Neurology   Complete by: As directed    An appointment is requested in approximately: 2 weeks   Call MD for:   Complete by: As directed    Recurrent vertigo/spinning of head or strokelike symptoms.   Call MD for:  persistant dizziness or light-headedness   Complete by: As directed    Diet - low sodium heart healthy   Complete by: As directed    Increase activity slowly   Complete by: As directed         Medication List     STOP taking these medications    aspirin EC 325 MG tablet Replaced by: aspirin 81 MG chewable  tablet       TAKE these medications    aspirin 81 MG chewable tablet Chew 1 tablet (81 mg total) by mouth daily. Start taking on: September 27, 2022 Replaces: aspirin EC 325 MG tablet   clopidogrel 75 MG tablet Commonly known as: PLAVIX Take 1 tablet (75 mg total) by mouth daily.   meclizine 25 MG tablet Commonly known as: ANTIVERT Take 1 tablet (25 mg total) by mouth 3 (three) times daily as needed for dizziness.   rosuvastatin 10 MG tablet Commonly known as: CRESTOR Take 1 tablet (10 mg total) by mouth daily.       Allergies  Allergen Reactions   Latex Itching, Swelling and Other (See Comments)    Reaction:  Localized swelling      Procedures/Studies: ECHOCARDIOGRAM COMPLETE  Result Date: 09/26/2022    ECHOCARDIOGRAM REPORT   Patient Name:   TELESHA DEGUZMAN Date of Exam: 09/26/2022 Medical Rec #:  [id number]      Height:       60.0 in Accession #:    [id number]     Weight:       156.0 lb Date of Birth:  1990/11/12      BSA:          1.680 m Patient Age:     31 years       BP:           103/70 mmHg Patient Gender: F              HR:           61 bpm. Exam Location:  Inpatient Procedure: 2D Echo, 3D Echo, Cardiac Doppler and Color Doppler Indications:    Syncope R55  History:        Patient has prior history of Echocardiogram examinations, most                 recent 05/02/2022.  Sonographer:    Leta Jungling RDCS Referring Phys: [id number] CHING T TU IMPRESSIONS  1. Left ventricular ejection fraction, by estimation, is 60 to 65%. Left ventricular ejection fraction by 3D volume is 63 %. The left ventricle has normal function. The left ventricle has no regional wall motion abnormalities. Left ventricular diastolic  parameters were normal.  2. Right ventricular systolic function is normal. The right ventricular size is normal.  3. The mitral valve is normal in structure. No evidence of mitral valve regurgitation. No evidence of mitral stenosis.  4. The aortic valve is tricuspid. Aortic valve regurgitation is not visualized. No aortic stenosis is present. Comparison(s): No significant change from prior study. Conclusion(s)/Recommendation(s): Normal biventricular function without evidence of hemodynamically significant valvular heart disease. FINDINGS  Left Ventricle: Left ventricular ejection fraction, by estimation, is 60 to 65%. Left ventricular ejection fraction by 3D volume is 63 %. The left ventricle has normal function. The left ventricle has no regional wall motion abnormalities. The left ventricular internal cavity size was normal in size. There is no left ventricular hypertrophy. Left ventricular diastolic parameters were normal. Right Ventricle: The right ventricular size is normal. No increase in right ventricular wall thickness. Right ventricular systolic function is normal. Left Atrium: Left atrial size was normal in size. Right Atrium: Right atrial size was normal in size. Pericardium: There is no evidence of pericardial effusion. Mitral Valve: The mitral valve  is normal in structure. No evidence of mitral valve regurgitation. No evidence of mitral valve stenosis. Tricuspid Valve: The tricuspid valve is normal in structure. Tricuspid  valve regurgitation is not demonstrated. No evidence of tricuspid stenosis. Aortic Valve: The aortic valve is tricuspid. Aortic valve regurgitation is not visualized. No aortic stenosis is present. Pulmonic Valve: The pulmonic valve was normal in structure. Pulmonic valve regurgitation is not visualized. No evidence of pulmonic stenosis. Aorta: The aortic root, ascending aorta and aortic arch are all structurally normal, with no evidence of dilitation or obstruction. IAS/Shunts: The atrial septum is grossly normal.  LEFT VENTRICLE PLAX 2D LVIDd:         4.32 cm         Diastology LVIDs:         3.54 cm         LV e' medial:    8.92 cm/s LV PW:         0.78 cm         LV E/e' medial:  6.2 LV IVS:        1.01 cm         LV e' lateral:   8.92 cm/s LVOT diam:     2.10 cm         LV E/e' lateral: 6.2 LV SV:         59 LV SV Index:   35 LVOT Area:     3.46 cm        3D Volume EF                                LV 3D EF:    Left                                             ventricul                                             ar                                             ejection                                             fraction                                             by 3D                                             volume is                                             63 %.  3D Volume EF:                                3D EF:        63 %                                LV EDV:       83 ml                                LV ESV:       31 ml                                LV SV:        52 ml RIGHT VENTRICLE RV S prime:     12.50 cm/s TAPSE (M-mode): 1.8 cm LEFT ATRIUM             Index       RIGHT ATRIUM          Index LA diam:        2.50 cm 1.49 cm/m  RA Area:     9.81 cm LA Vol (A2C):   11.4 ml 6.79 ml/m  RA  Volume:   16.20 ml 9.65 ml/m LA Vol (A4C):   15.4 ml 9.17 ml/m LA Biplane Vol: 13.6 ml 8.10 ml/m  AORTIC VALVE LVOT Vmax:   81.30 cm/s LVOT Vmean:  61.900 cm/s LVOT VTI:    0.170 m  AORTA Ao Root diam: 2.70 cm Ao Asc diam:  2.50 cm MITRAL VALVE MV Area (PHT): 3.31 cm    SHUNTS MV Decel Time: 229 msec    Systemic VTI:  0.17 m MV E velocity: 55.40 cm/s  Systemic Diam: 2.10 cm MV A velocity: 39.20 cm/s MV E/A ratio:  1.41 Riley Lam MD Electronically signed by Riley Lam MD Signature Date/Time: 09/26/2022/12:31:55 PM    Final    VAS [place] LOWER EXTREMITY VENOUS (DVT) (ONLY [facility] & [facility])  Result Date: 09/26/2022  Lower Venous DVT Study Patient Name:  RAECHELLE SARTI  Date of Exam:   09/26/2022 Medical Rec #: [id number]       Accession #:    [id number] Date of Birth: 1991-09-18       Patient Gender: F Patient Age:   84 years Exam Location:  [facility] Procedure:      VAS [place] LOWER EXTREMITY VENOUS (DVT) Referring Phys: Roxy Horseman --------------------------------------------------------------------------------  Indications: Pain.  Comparison Study: Previous exam on 04/19/22 was negative for DVT Performing Technologist: Ernestene Mention RVT, RDMS  Examination Guidelines: A complete evaluation includes B-mode imaging, spectral Doppler, color Doppler, and power Doppler as needed of all accessible portions of each vessel. Bilateral testing is considered an integral part of a complete examination. Limited examinations for reoccurring indications may be performed as noted. The reflux portion of the exam is performed with the patient in reverse Trendelenburg.  +---------+---------------+---------+-----------+----------+--------------+ RIGHT    CompressibilityPhasicitySpontaneityPropertiesThrombus Aging +---------+---------------+---------+-----------+----------+--------------+ CFV      Full           Yes      Yes                                  +---------+---------------+---------+-----------+----------+--------------+  SFJ      Full                                                        +---------+---------------+---------+-----------+----------+--------------+ FV Prox  Full           Yes      Yes                                 +---------+---------------+---------+-----------+----------+--------------+ FV Mid   Full           Yes      Yes                                 +---------+---------------+---------+-----------+----------+--------------+ FV DistalFull           Yes      Yes                                 +---------+---------------+---------+-----------+----------+--------------+ PFV      Full                                                        +---------+---------------+---------+-----------+----------+--------------+ POP      Full           Yes      Yes                                 +---------+---------------+---------+-----------+----------+--------------+ PTV      Full                                                        +---------+---------------+---------+-----------+----------+--------------+ PERO     Full                                                        +---------+---------------+---------+-----------+----------+--------------+   +---------+---------------+---------+-----------+----------+--------------+ LEFT     CompressibilityPhasicitySpontaneityPropertiesThrombus Aging +---------+---------------+---------+-----------+----------+--------------+ CFV      Full           Yes      Yes                                 +---------+---------------+---------+-----------+----------+--------------+ SFJ      Full                                                        +---------+---------------+---------+-----------+----------+--------------+  FV Prox  Full           Yes      Yes                                  +---------+---------------+---------+-----------+----------+--------------+ FV Mid   Full           Yes      Yes                                 +---------+---------------+---------+-----------+----------+--------------+ FV DistalFull           Yes      Yes                                 +---------+---------------+---------+-----------+----------+--------------+ PFV      Full                                                        +---------+---------------+---------+-----------+----------+--------------+ POP      Full           Yes      Yes                                 +---------+---------------+---------+-----------+----------+--------------+ PTV      Full                                                        +---------+---------------+---------+-----------+----------+--------------+ PERO     Full                                                        +---------+---------------+---------+-----------+----------+--------------+    Summary: BILATERAL: - No evidence of deep vein thrombosis seen in the lower extremities, bilaterally. -No evidence of popliteal cyst, bilaterally.   *See table(s) above for measurements and observations.    Preliminary    CT HEAD WO CONTRAST  Result Date: 09/26/2022 CLINICAL DATA:  Syncope/presyncope, cerebrovascular cause suspected EXAM: CT HEAD WITHOUT CONTRAST TECHNIQUE: Contiguous axial images were obtained from the base of the skull through the vertex without intravenous contrast. RADIATION DOSE REDUCTION: This exam was performed according to the departmental dose-optimization program which includes automated exposure control, adjustment of the mA and/or kV according to patient size and/or use of iterative reconstruction technique. COMPARISON:  09/25/2022 FINDINGS: Brain: No acute intracranial abnormality. Specifically, no hemorrhage, hydrocephalus, mass lesion, acute infarction, or significant intracranial injury. Vascular: No  hyperdense vessel or unexpected calcification. Skull: No acute calvarial abnormality. Sinuses/Orbits: No acute findings Other: None IMPRESSION: No acute intracranial abnormality. Electronically Signed   By: Charlett Nose M.D.   On: 09/26/2022 03:04   CT ANGIO HEAD NECK W WO CM  Result Date: 09/25/2022 CLINICAL DATA:  Transient ischemic attack (TIA) EXAM: CT  ANGIOGRAPHY HEAD AND NECK TECHNIQUE: Multidetector CT imaging of the head and neck was performed using the standard protocol during bolus administration of intravenous contrast. Multiplanar CT image reconstructions and MIPs were obtained to evaluate the vascular anatomy. Carotid stenosis measurements (when applicable) are obtained utilizing NASCET criteria, using the distal internal carotid diameter as the denominator. RADIATION DOSE REDUCTION: This exam was performed according to the departmental dose-optimization program which includes automated exposure control, adjustment of the mA and/or kV according to patient size and/or use of iterative reconstruction technique. CONTRAST:  75mL OMNIPAQUE IOHEXOL 350 MG/ML SOLN COMPARISON:  Same day CT head.  CTA June 14 23. FINDINGS: CTA NECK FINDINGS Aortic arch: Great vessel origins are patent without significant stenosis. Right carotid system: No evidence of dissection, stenosis (50% or greater), or occlusion. Left carotid system: No evidence of dissection, stenosis (50% or greater), or occlusion. 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 The V2 vertebral arteries is now opacified. Diminutive intradural segment with suspected superimposed stenosis and nonopacification. Right vertebral artery is patent without significant stenosis. Skeleton: No acute findings. Other neck: No acute findings. Upper chest: Visualized lung apices are clear. Paraseptal emphysema. Review of the MIP images confirms the above findings CTA  HEAD FINDINGS Anterior circulation: Bilateral intracranial ICAs, MCAs, and ACAs are patent without proximal hemodynamically significant stenosis. Small (1-2 mm) posteriorly directed outpouching arising from the right supraclinoid ICA (series 7, image 108) Posterior circulation: Small/non dominant left intradural vertebral artery with suspected stenosis of the intradural segment which is not opacified. Right intradural vertebral artery the basilar artery and bilateral posterior cerebral arteries are patent without proximal hemodynamically significant stenosis. Venous sinuses: Poorly evaluated due to arterial timing. Review of the MIP images confirms the above findings IMPRESSION: 1. No new/emergent large vessel occlusion. 2. 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 The V2 vertebral arteries is now opacified. Diminutive intradural segment with suspected superimposed stenosis and nonopacification. 3. Small (1-2 mm) posteriorly directed outpouching arising from the right supraclinoid ICA, which could represent an infundibulum or aneurysm. 4. Emphysema (ICD10-J43.9). Electronically Signed   By: Feliberto Harts M.D.   On: 09/25/2022 19:00   CT Head Wo Contrast  Result Date: 09/25/2022 CLINICAL DATA:  TIA, dizziness EXAM: CT HEAD WITHOUT CONTRAST TECHNIQUE: Contiguous axial images were obtained from the base of the skull through the vertex without intravenous contrast. RADIATION DOSE REDUCTION: This exam was performed according to the departmental dose-optimization program which includes automated exposure control, adjustment of the mA and/or kV according to patient size and/or use of iterative reconstruction technique. COMPARISON:  04/30/2022 FINDINGS: Brain: No acute intracranial findings are seen. There are no signs of bleeding within the cranium. There is linear area of fluid density in the posterior left cerebellum suggesting  old lacunar infarct. Vascular: Unremarkable. Skull: Unremarkable. Sinuses/Orbits: Unremarkable. Other: None. IMPRESSION: No acute findings are seen in noncontrast CT brain. Small old lacunar infarct is seen in left cerebellum. Electronically Signed   By: Ernie Avena M.D.   On: 09/25/2022 14:32      Subjective: Seen this morning while still in ED.  Was n.p.o., hungry and asking for food.  RN was at bedside getting ready to do her swallow eval.  She denied any further vertigo, dysarthria or strokelike symptoms.  She had already worked with OT this morning.  Discharge Exam:  Vitals:   09/26/22  1000 09/26/22 1400 09/26/22 1445 09/26/22 1500  BP: 103/70 108/65  101/71  Pulse: 63 75 73   Resp:      Temp:      TempSrc:      SpO2: 100% 100% 99%   Weight:      Height:        General: Pt lying comfortably in bed & appears in no obvious distress. Cardiovascular: S1 & S2 heard, RRR, S1/S2 +. No murmurs, rubs, gallops or clicks. No JVD or pedal edema.  Telemetry personally reviewed: Sinus rhythm. Respiratory: Clear to auscultation without wheezing, rhonchi or crackles. No increased work of breathing. Abdominal:  Non distended, non tender & soft. No organomegaly or masses appreciated. Normal bowel sounds heard. CNS: Alert and oriented. No focal deficits. Extremities: no edema, no cyanosis    The results of significant diagnostics from this hospitalization (including imaging, microbiology, ancillary and laboratory) are listed below for reference.     Microbiology: No results found for this or any previous visit (from the past 240 hour(s)).   Labs: CBC: Recent Labs  Lab 09/25/22 1320  WBC 10.9*  NEUTROABS 6.5  HGB 13.8  HCT 42.8  MCV 90.5  PLT 241    Basic Metabolic Panel: Recent Labs  Lab 09/25/22 1320  NA 137  K 4.4  CL 103  CO2 23  GLUCOSE 114*  BUN 11  CREATININE 0.83  CALCIUM 9.3    Liver Function Tests: Recent Labs  Lab 09/25/22 1320  AST 22  ALT 13   ALKPHOS 77  BILITOT 0.2*  PROT 7.4  ALBUMIN 3.8     Hgb A1c Recent Labs    09/26/22 0430  HGBA1C 5.4    Lipid Profile Recent Labs    09/26/22 0430  CHOL 155  HDL 43  LDLCALC 85  TRIG 135  CHOLHDL 3.6    Anemia work up Recent Labs    09/26/22 0430  VITAMINB12 266     Time coordinating discharge: 25 minutes  SIGNED:  Marcellus ScottAnand Kambrea Carrasco, MD,  FACP, FHM, [facility] HospitalFHM, [facility] LLCCMPC, [facility] CenterCHCQM-PHYADV   [facility] [place]     To contact the attending provider between 7A-7P or the covering provider during after hours 7P-7A, please log into the web site [email or web address] and access using universal  password for that web site. If you do not have the password, please call the hospital operator.

## 2022-09-26 NOTE — ED Notes (Signed)
Pt not in room. Seen walking out of unit with clothing on. Belongings still in room.

## 2022-09-26 NOTE — ED Notes (Signed)
Dr. Waymon Amato contacted about pt requesting discharge paperwork.

## 2022-09-26 NOTE — Evaluation (Addendum)
Speech Language Pathology Evaluation Patient Details Name: Theresa [name] MRN: [id number] DOB: [birth year] Today's Date: 09/26/2022 Time: 1000-1031 SLP Time Calculation (min) (ACUTE ONLY): 31 min  Problem List:  Patient Active Problem List   Diagnosis Date Noted   Tobacco use 09/26/2022   Bilateral lower extremity pain 09/26/2022   Dizziness 04/30/2022   Recent cerebrovascular accident (CVA) 04/30/2022   UTI (urinary tract infection) 04/30/2022   Leukocytosis 04/30/2022   Sinus bradycardia 04/30/2022   Elevated troponin 04/19/2022   CVA (cerebral vascular accident) (HCC) 04/19/2022   Headache 04/19/2022   Obesity (BMI 30-39.9) 04/19/2022   Stroke (HCC) 04/19/2022   Syncope 04/18/2022   Vaginal bleeding 04/11/2017   Placenta previa 04/10/2017   Family history of genetic disorder 02/26/2017   Depression 05/16/2016   Tobacco abuse 04/18/2016   Marijuana use 04/18/2016   Pica 03/15/2016   Past Medical History:  Past Medical History:  Diagnosis Date   Anemia    Chlamydia    Depression    Eczema    Gonorrhea    Pyelonephritis    X 2   Past Surgical History:  Past Surgical History:  Procedure Laterality Date   BUBBLE STUDY  05/02/2022   Procedure: BUBBLE STUDY;  Surgeon: Chrystie Nose, MD;  Location: [facility] ENDOSCOPY;  Service: Cardiovascular;;   CESAREAN SECTION N/A 05/20/2017   Procedure: CESAREAN SECTION;  Surgeon: Lazaro Arms, MD;  Location: [facility] BIRTHING SUITES;  Service: Obstetrics;  Laterality: N/A;   NO PAST SURGERIES     TEE WITHOUT CARDIOVERSION N/A 05/02/2022   Procedure: TRANSESOPHAGEAL ECHOCARDIOGRAM (TEE);  Surgeon: Chrystie Nose, MD;  Location: [facility] ENDOSCOPY;  Service: Cardiovascular;  Laterality: N/A;   TUBAL LIGATION Bilateral 05/20/2017   Procedure: BILATERAL TUBAL LIGATION;  Surgeon: Lazaro Arms, MD;  Location: [facility] BIRTHING SUITES;  Service: Obstetrics;  Laterality: Bilateral;   HPI:  Pt is a 31 yo female presenting 11/20 with vertigo. CT Head  negative. Not able to have MRI due to metallic artifact in her weaved in wig. 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 She denies any acute changes, but acknowledges a change in her memory since her prior stroke several months ago. Memory deficits would account for many of the errors made on testing today. Pt describes memory changes that have had a significant impact on her ability to perform daily tasks, including forgetting things at work or even sometimes forgetting to pock up her kids at school. Pt would benefit from SLP f/u upon discharge to try to maximize independence, cognitive function, and safety.    SLP Assessment  SLP Recommendation/Assessment: All further Speech Lanaguage Pathology  needs can be addressed in the next venue of care SLP Visit Diagnosis: Cognitive communication deficit (R41.841)    Recommendations for follow up therapy are one component of a multi-disciplinary discharge planning process, led by the attending physician.  Recommendations may be updated based on patient status, additional functional criteria and insurance authorization.    Follow Up Recommendations  Home health SLP    Assistance Recommended at Discharge  Frequent or constant Supervision/Assistance (upon initial return home - planning to go home with sister at first)  Functional Status Assessment Patient has had a recent decline in their functional status and demonstrates the ability to make  significant improvements in function in a reasonable and predictable amount of time.  Frequency and Duration           SLP Evaluation Cognition  Overall Cognitive Status: Impaired/Different from baseline (impaired since stroke several months  agio) Arousal/Alertness: Awake/alert Orientation Level: Oriented X4 Attention: Sustained;Selective Sustained Attention: Appears intact Selective Attention: Impaired Selective Attention Impairment: Verbal complex Memory: Impaired Memory Impairment: Retrieval deficit;Decreased recall of new information;Other (comment) (working memory) Awareness: Appears intact Problem Solving: Impaired Problem Solving Impairment: Verbal complex       Comprehension  Auditory Comprehension Overall Auditory Comprehension: Appears within functional limits for tasks assessed    Expression Expression Primary Mode of Expression: Verbal Verbal Expression Overall Verbal Expression: Appears within functional limits for tasks assessed Written Expression Dominant Hand: Right   Oral / Motor  Motor Speech Overall Motor Speech: Appears within functional limits for tasks assessed            Mahala Menghini., M.A. CCC-SLP Acute Rehabilitation Services Office [phone]  Secure chat preferred  09/26/2022, 10:48 AM

## 2022-09-26 NOTE — ED Notes (Addendum)
Charge nurse Asher Muir and provider notified of patient situation. Patient currently outside smoking even though this RN advised against

## 2022-09-26 NOTE — ED Notes (Signed)
Breakfast tray delivered

## 2022-10-04 ENCOUNTER — Ambulatory Visit: Payer: Medicaid Other | Attending: Physician Assistant

## 2022-10-04 DIAGNOSIS — I4891 Unspecified atrial fibrillation: Secondary | ICD-10-CM

## 2022-10-05 IMAGING — CT CT ANGIO NECK
2 of 7 series · 8 of 33 positions shown · IV contrast (OMNIPAQUE)
Comparison: Prior MRI from earlier the same day and CT from
04/18/2022.

CLINICAL DATA: Follow-up examination for acute stroke.

EXAM:
CT ANGIOGRAPHY HEAD AND NECK
TECHNIQUE: Multidetector CT imaging of the head and neck was performed using
the standard protocol during bolus administration of intravenous
contrast. Multiplanar CT image reconstructions and MIPs were
obtained to evaluate the vascular anatomy. Carotid stenosis
measurements (when applicable) are obtained utilizing NASCET
criteria, using the distal internal carotid diameter as the
denominator.

[Series 5: cta head neck · axial · 0.40mm/px · z∈[-241,-143]mm · 2 of 149 slices shown]
[im 50/149  soft-tissue]
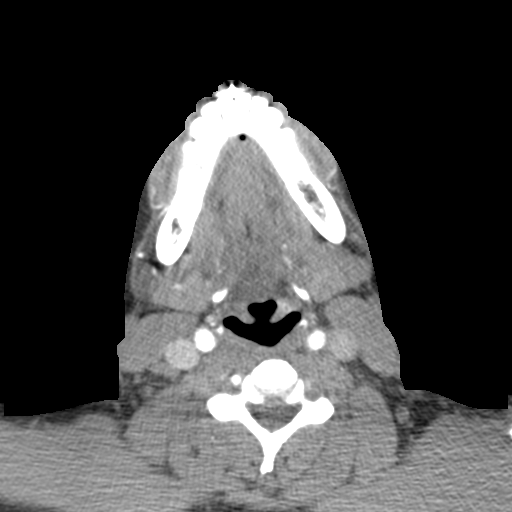
[im 99/149  soft-tissue]
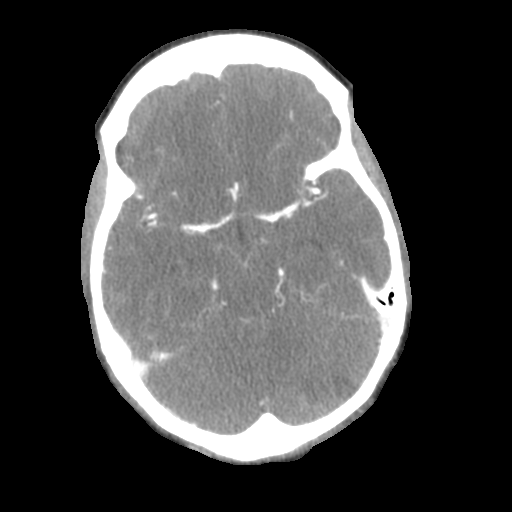

[Series 7: ax thin · axial · 0.39mm/px · z∈[-297,-90]mm · 6 of 291 slices shown]
[im 42/291  soft-tissue]
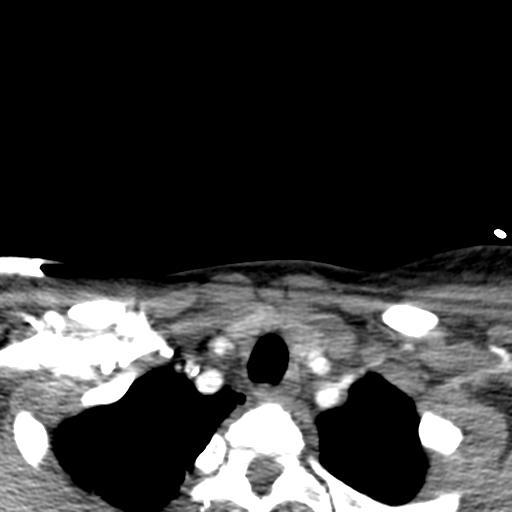
[im 83/291  bone]
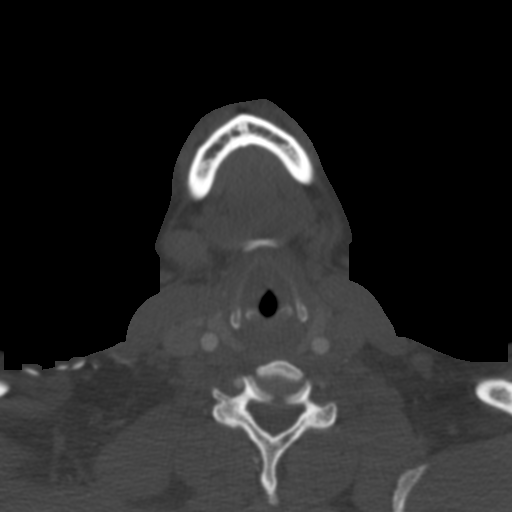
[im 125/291  soft-tissue]
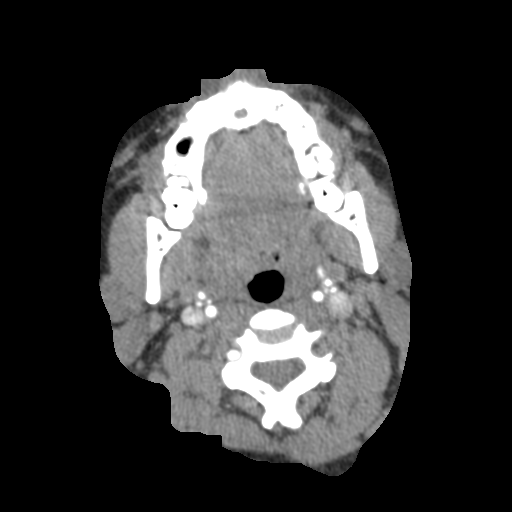
[im 166/291  bone]
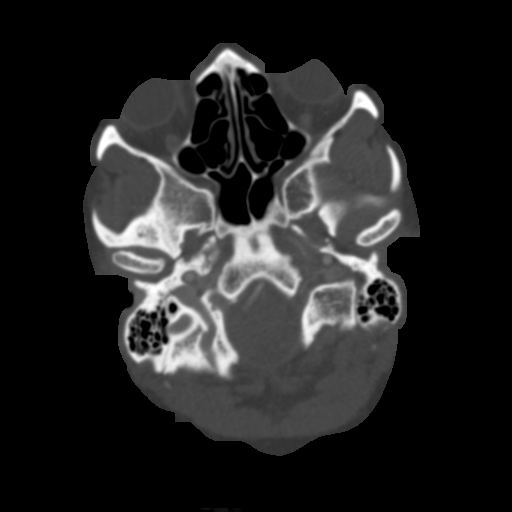
[im 208/291  soft-tissue]
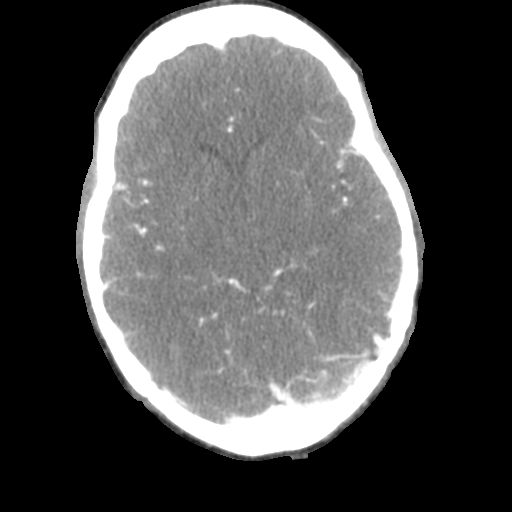
[im 249/291  bone]
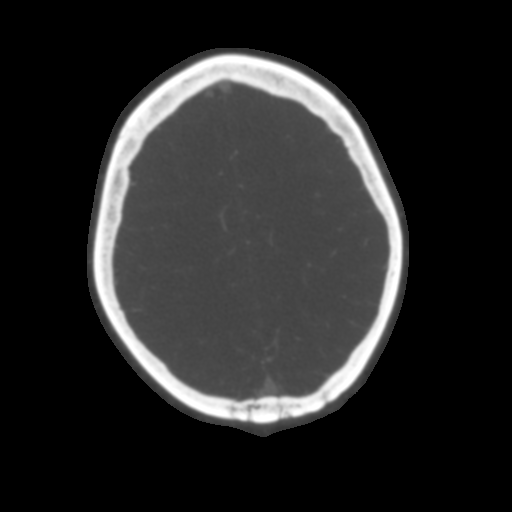

[8 of 33 positions shown; findings below may reference images not displayed]

RADIATION DOSE REDUCTION: This exam was performed according to the
departmental dose-optimization program which includes automated
exposure control, adjustment of the mA and/or kV according to
patient size and/or use of iterative reconstruction technique.

CONTRAST:  60mL OMNIPAQUE IOHEXOL 350 MG/ML SOLN
FINDINGS: CTA NECK FINDINGS

Aortic arch: Visualized aortic arch normal in caliber with standard
3 vessel morphology. No stenosis or other abnormality about the
origin the great vessels.

Right carotid system: Right common and internal carotid arteries
widely patent without stenosis, dissection or occlusion.

Left carotid system: Left common and internal carotid arteries
widely patent without stenosis, dissection or occlusion.

Vertebral arteries: Both vertebral arteries arise from the
subclavian arteries. No proximal subclavian artery stenosis. Right
vertebral artery widely patent within the neck without stenosis or
other abnormality. Left vertebral artery patent at its origin, but
becomes it recently attenuated as it courses superiorly within the
neck. The left vertebral artery occludes by the mid-distal left V2
segment, and remains occluded to the skull base.

Skeleton: No discrete or worrisome osseous lesions.

Other neck: No other acute soft tissue abnormality within the neck.

Upper chest: Visualized upper chest demonstrates no acute finding.
Paraseptal emphysematous changes present at the lung apices.

Review of the MIP images confirms the above findings

CTA HEAD FINDINGS

Anterior circulation: Both internal carotid arteries patent to the
termini without stenosis. A1 segments patent. Normal anterior
communicating complex. Anterior cerebral arteries widely patent. No
M1 stenosis or occlusion right no visible proximal MCA branch
occlusion. Distal MCA branches perfused and symmetric.

Posterior circulation: Right vertebral artery widely patent to the
vertebrobasilar junction. Right PICA faintly visible and is patent
at its origin. Left vertebral artery occluded at the skull base.
Minimal retrograde filling of the distal left V4 segment at the
vertebrobasilar junction. Left V4 segment otherwise occluded. Left
PICA not well seen. Basilar patent to its distal aspect without
stenosis. Superior cerebellar and posterior cerebral arteries patent
bilaterally.

Venous sinuses: Patent allowing for timing the contrast bolus.

Anatomic variants: None significant.  No aneurysm.

Review of the MIP images confirms the above findings
IMPRESSION: 1. Increasing attenuation within the proximal left vertebral artery
within the neck, with subsequent occlusion by the V2 segment. Left
vertebral artery remains largely occluded distally to the
vertebrobasilar junction. Finding is presumably acute given the
presence of the acute left cerebellar infarcts, possibly reflecting
a dissection.
2. Otherwise normal CTA of the head and neck. No other large vessel
occlusion. No other hemodynamically significant or correctable
stenosis.
3.  Emphysema ([id number]).

## 2022-11-01 ENCOUNTER — Institutional Professional Consult (permissible substitution): Payer: Medicaid Other | Admitting: Cardiovascular Disease

## 2022-11-02 ENCOUNTER — Encounter: Payer: Self-pay | Admitting: Neurology

## 2022-11-13 ENCOUNTER — Encounter: Payer: Self-pay | Admitting: Neurology

## 2022-11-13 ENCOUNTER — Ambulatory Visit: Payer: Medicaid Other | Admitting: Neurology

## 2022-11-26 NOTE — Progress Notes (Signed)
Kindly inform the patient that 30-day heart monitor study shows no evidence of atrial fibrillation or any other worrisome finding.

## 2022-12-05 ENCOUNTER — Emergency Department (HOSPITAL_COMMUNITY): Payer: Medicaid Other

## 2022-12-05 ENCOUNTER — Encounter (HOSPITAL_COMMUNITY): Payer: Self-pay

## 2022-12-05 ENCOUNTER — Emergency Department (HOSPITAL_COMMUNITY)
Admission: EM | Admit: 2022-12-05 | Discharge: 2022-12-05 | Discharge status: Against Medical Advice | Payer: Medicaid Other | Attending: Emergency Medicine | Admitting: Emergency Medicine

## 2022-12-05 ENCOUNTER — Other Ambulatory Visit: Payer: Self-pay

## 2022-12-05 DIAGNOSIS — Z5329 Procedure and treatment not carried out because of patient's decision for other reasons: Secondary | ICD-10-CM | POA: Diagnosis not present

## 2022-12-05 DIAGNOSIS — F1510 Other stimulant abuse, uncomplicated: Secondary | ICD-10-CM | POA: Diagnosis not present

## 2022-12-05 DIAGNOSIS — D72829 Elevated white blood cell count, unspecified: Secondary | ICD-10-CM | POA: Diagnosis not present

## 2022-12-05 DIAGNOSIS — Z7902 Long term (current) use of antithrombotics/antiplatelets: Secondary | ICD-10-CM | POA: Insufficient documentation

## 2022-12-05 DIAGNOSIS — R519 Headache, unspecified: Secondary | ICD-10-CM | POA: Diagnosis present

## 2022-12-05 DIAGNOSIS — Z91040 Latex allergy status: Secondary | ICD-10-CM | POA: Diagnosis not present

## 2022-12-05 DIAGNOSIS — F1210 Cannabis abuse, uncomplicated: Secondary | ICD-10-CM | POA: Diagnosis not present

## 2022-12-05 DIAGNOSIS — M542 Cervicalgia: Secondary | ICD-10-CM | POA: Insufficient documentation

## 2022-12-05 DIAGNOSIS — Z7982 Long term (current) use of aspirin: Secondary | ICD-10-CM | POA: Insufficient documentation

## 2022-12-05 DIAGNOSIS — Y900 Blood alcohol level of less than 20 mg/100 ml: Secondary | ICD-10-CM | POA: Diagnosis not present

## 2022-12-05 LAB — RAPID URINE DRUG SCREEN, HOSP PERFORMED
Amphetamines: POSITIVE — AB
Barbiturates: NOT DETECTED
Benzodiazepines: NOT DETECTED
Cocaine: NOT DETECTED
Opiates: NOT DETECTED
Tetrahydrocannabinol: POSITIVE — AB

## 2022-12-05 LAB — URINALYSIS, ROUTINE W REFLEX MICROSCOPIC
Bilirubin Urine: NEGATIVE
Glucose, UA: NEGATIVE mg/dL
Hgb urine dipstick: NEGATIVE
Ketones, ur: NEGATIVE mg/dL
Leukocytes,Ua: NEGATIVE
Nitrite: POSITIVE — AB
Protein, ur: NEGATIVE mg/dL
Specific Gravity, Urine: 1.02 (ref 1.005–1.030)
pH: 5 (ref 5.0–8.0)

## 2022-12-05 LAB — DIFFERENTIAL
Abs Immature Granulocytes: 0.03 10*3/uL (ref 0.00–0.07)
Basophils Absolute: 0 10*3/uL (ref 0.0–0.1)
Basophils Relative: 0 %
Eosinophils Absolute: 0.2 10*3/uL (ref 0.0–0.5)
Eosinophils Relative: 2 %
Immature Granulocytes: 0 %
Lymphocytes Relative: 38 %
Lymphs Abs: 4 10*3/uL (ref 0.7–4.0)
Monocytes Absolute: 0.5 10*3/uL (ref 0.1–1.0)
Monocytes Relative: 5 %
Neutro Abs: 5.9 10*3/uL (ref 1.7–7.7)
Neutrophils Relative %: 55 %

## 2022-12-05 LAB — COMPREHENSIVE METABOLIC PANEL
ALT: 13 U/L (ref 0–44)
AST: 20 U/L (ref 15–41)
Albumin: 3.7 g/dL (ref 3.5–5.0)
Alkaline Phosphatase: 70 U/L (ref 38–126)
Anion gap: 8 (ref 5–15)
BUN: 8 mg/dL (ref 6–20)
CO2: 24 mmol/L (ref 22–32)
Calcium: 9.1 mg/dL (ref 8.9–10.3)
Chloride: 104 mmol/L (ref 98–111)
Creatinine, Ser: 0.88 mg/dL (ref 0.44–1.00)
GFR, Estimated: >60 mL/min (ref >60)
Glucose, Bld: 92 mg/dL (ref 70–99)
Potassium: 3.8 mmol/L (ref 3.5–5.1)
Sodium: 136 mmol/L (ref 135–145)
Total Bilirubin: 0.4 mg/dL (ref 0.3–1.2)
Total Protein: 7.2 g/dL (ref 6.5–8.1)

## 2022-12-05 LAB — PROTIME-INR
INR: 1 (ref 0.8–1.2)
Prothrombin Time: 13.1 seconds (ref 11.4–15.2)

## 2022-12-05 LAB — CBC
HCT: 41.8 % (ref 36.0–46.0)
Hemoglobin: 14 g/dL (ref 12.0–15.0)
MCH: 29.5 pg (ref 26.0–34.0)
MCHC: 33.5 g/dL (ref 30.0–36.0)
MCV: 88.2 fL (ref 80.0–100.0)
Platelets: 250 10*3/uL (ref 150–400)
RBC: 4.74 MIL/uL (ref 3.87–5.11)
RDW: 13.5 % (ref 11.5–15.5)
WBC: 10.6 10*3/uL — ABNORMAL HIGH (ref 4.0–10.5)
nRBC: 0 % (ref 0.0–0.2)

## 2022-12-05 LAB — PREGNANCY, URINE: Preg Test, Ur: NEGATIVE

## 2022-12-05 LAB — ETHANOL: Alcohol, Ethyl (B): <10 mg/dL (ref <10)

## 2022-12-05 LAB — APTT: aPTT: 26 seconds (ref 24–36)

## 2022-12-05 MED ORDER — ACETAMINOPHEN 500 MG PO TABS
1000.0000 mg | ORAL_TABLET | Freq: Once | ORAL | Status: DC
  Filled 2022-12-05: qty 2

## 2022-12-05 MED ORDER — PROCHLORPERAZINE EDISYLATE 10 MG/2ML IJ SOLN
10.0000 mg | Freq: Once | INTRAMUSCULAR | Status: DC
  Filled 2022-12-05: qty 2

## 2022-12-05 MED ORDER — DIPHENHYDRAMINE HCL 50 MG/ML IJ SOLN
25.0000 mg | Freq: Once | INTRAMUSCULAR | Status: DC
  Filled 2022-12-05: qty 1

## 2022-12-05 NOTE — ED Notes (Signed)
Patient does not want to take IV medications if she is not staying. MD made aware.

## 2022-12-05 NOTE — ED Provider Triage Note (Signed)
Emergency Medicine Provider Triage Evaluation Note  Theresa [name] , a 32 y.o. female  was evaluated in triage.  Pt complains of left-sided headache and neck pain for the last 3 days.  States that she has had the symptoms before been evaluated multiple times in the ED as well as admitted to the hospital but is unsure exactly what is causing her symptoms.  Per recent discharge summary on 09/26/2022, patient has a history of cryptogenic strokes with no residual deficits and at that time was evaluated by neurology who believes that episodes were concerning for TIAs.  She denies chest pain, shortness of breath, nausea, vomiting, fever, or chills.  Review of Systems  Positive: See HPI Negative: See HPI  Physical Exam  BP (!) 138/105 (BP Location: Right Arm)   Pulse 89   Temp 98.8 F (37.1 C) (Oral)   Resp 16   Ht 5' (1.524 m)   Wt 74.8 kg   SpO2 98%   BMI 32.22 kg/m  Gen:   Awake, no distress   Resp:  Normal effort  MSK:   Moves extremities without difficulty  Other:  Alert and oriented, 5/5 strength bilateral UE and LE, normal speech, CNI, RRR, mild tenderness over left trapezius muscles and left chest wall, no midline CTL spine tenderness, no stepoffs or deformities  Medical Decision Making  Medically screening exam initiated at 3:05 PM.  Appropriate orders placed.  Kolbee L Mullenbach was informed that the remainder of the evaluation will be completed by another provider, this initial triage assessment does not replace that evaluation, and the importance of remaining in the ED until their evaluation is complete.     Suzzette Righter, PA-C 12/05/22 [phone]

## 2022-12-05 NOTE — ED Notes (Addendum)
Patient states she does not want an IV and she wants to leave. Encourgaed the patient to return is symptoms worsened. Patient also took off her vital sign cords and EKG. Patient signed the AMA form. MD is aware.

## 2022-12-05 NOTE — ED Provider Notes (Signed)
[facility] Provider Note   CSN: [id number] Arrival date & time: 12/05/22  1425     History  Chief Complaint  Patient presents with   Headache   Neck Pain    Theresa [name] is a 32 y.o. female.  With PMH of CVA, HLD, anemia presenting with left sided atraumatic headache and neck pain x 3 days.  Patient denies any recent trauma.  Her headache and neck pain started about 3 days ago.  It worsened over time and was not severe in onset.  She has been having pulsating pain that has been constant.  She has been taking aspirin without relief.  She has not been on her Plavix for the past 2 weeks.  She feels like it is similar to previous dissection of her artery.  She has had no visual changes, no focal weakness numbness or tingling, no loss of sensation, no slurred speech.  She denies any infectious symptoms, no fevers, no vomiting, no URI-like symptoms no UTI sx.   Headache Associated symptoms: neck pain   Neck Pain Associated symptoms: headaches        Home Medications Prior to Admission medications   Medication Sig Start Date End Date Taking? Authorizing Provider  aspirin 81 MG chewable tablet Chew 1 tablet (81 mg total) by mouth daily. 09/27/22   Hongalgi, Lenis Dickinson, MD  clopidogrel (PLAVIX) 75 MG tablet Take 1 tablet (75 mg total) by mouth daily. 09/26/22   Hongalgi, Lenis Dickinson, MD  meclizine (ANTIVERT) 25 MG tablet Take 1 tablet (25 mg total) by mouth 3 (three) times daily as needed for dizziness. 05/02/22   Nita Sells, MD  rosuvastatin (CRESTOR) 10 MG tablet Take 1 tablet (10 mg total) by mouth daily. 09/26/22   Hongalgi, Lenis Dickinson, MD      Allergies    Latex    Review of Systems   Review of Systems  Musculoskeletal:  Positive for neck pain.  Neurological:  Positive for headaches.    Physical Exam Updated Vital Signs BP (!) 138/105 (BP Location: Right Arm)   Pulse 89   Temp 98.8 F (37.1 C) (Oral)   Resp 16   Ht 5'  (1.524 m)   Wt 74.8 kg   SpO2 98%   BMI 32.22 kg/m  Physical Exam Constitutional: Alert and oriented. Well appearing and in no distress. Eyes: Conjunctivae are normal. ENT      Head: Normocephalic and atraumatic.      Neck: No stridor.  Left-sided cervical paraspinal tenderness and SCM ttp, full ROM of neck intact, no meningismus Cardiovascular: S1, S2,  Normal and symmetric distal pulses are present in all extremities.Warm and well perfused. Respiratory: Normal respiratory effort.  Gastrointestinal: flat Musculoskeletal: Normal range of motion in all extremities. Neurologic:Normal speech and language without aphasia. AAOx4. PERRL. EOMI without nystagmus. Face symmetrical without droop. CN II-XII intact. Normal facial sensation. Tongue midline. No pronator drift. 5/5 strength in upper and lower extremities. Normal sensation to light touch in all extremities. Normal finger-to-nose. Normal gait. Normal heel to shin. No focal neurologic deficits are appreciated. Skin: Skin is warm, dry and intact. No rash noted. Psychiatric: Mood and affect are normal. Speech and behavior are normal.  ED Results / Procedures / Treatments   Labs (all labs ordered are listed, but only abnormal results are displayed) Labs Reviewed  CBC - Abnormal; Notable for the following components:      Result Value   WBC 10.6 (*)  All other components within normal limits  RAPID URINE DRUG SCREEN, HOSP PERFORMED - Abnormal; Notable for the following components:   Amphetamines POSITIVE (*)    Tetrahydrocannabinol POSITIVE (*)    All other components within normal limits  URINALYSIS, ROUTINE W REFLEX MICROSCOPIC - Abnormal; Notable for the following components:   APPearance HAZY (*)    Nitrite POSITIVE (*)    Bacteria, UA RARE (*)    All other components within normal limits  PROTIME-INR  APTT  DIFFERENTIAL  COMPREHENSIVE METABOLIC PANEL  ETHANOL  PREGNANCY, URINE    EKG None  Radiology CT HEAD WO  CONTRAST  Result Date: 12/05/2022 CLINICAL DATA:  Left-sided headache and neck pain. EXAM: CT HEAD WITHOUT CONTRAST TECHNIQUE: Contiguous axial images were obtained from the base of the skull through the vertex without intravenous contrast. RADIATION DOSE REDUCTION: This exam was performed according to the departmental dose-optimization program which includes automated exposure control, adjustment of the mA and/or kV according to patient size and/or use of iterative reconstruction technique. COMPARISON:  Head CT 09/26/2022.  MRI brain 04/30/2022 FINDINGS: Brain: No evidence of acute infarction, hemorrhage, hydrocephalus, extra-axial collection or mass lesion/mass effect. Small old left cerebellar infarct is again seen. Vascular: No hyperdense vessel or unexpected calcification. Skull: Normal. Negative for fracture or focal lesion. Sinuses/Orbits: No acute finding. Other: None. IMPRESSION: 1. No acute intracranial abnormality. 2. Small old left cerebellar infarct. Electronically Signed   By: Ronney Asters M.D.   On: 12/05/2022 15:31    Procedures Procedures   Medications Ordered in ED Medications - No data to display  ED Course/ Medical Decision Making/ A&P Clinical Course as of 12/05/22 2017  Tue Dec 05, 2022  2015 Patient leaving and signed AMA despite risk of missed dissection, aneurysm that could lead to permanent disability and death.  She is understand the risks of leaving and has capacity . [VB]    Clinical Course User Index [VB] Elgie Congo, MD   {                            Medical Decision Making  Theresa [name] is a 32 y.o. female.  With PMH of CVA, HLD, anemia presenting with left sided atraumatic headache and neck pain x 3 days.   Regarding the patient's headache, I suspect it is most consistent with primary headache in nature such as tension or migraine headache. I do not think CVA as the patient has no focal neurologic deficits. Additionally, I do not think SAH as the  patient did not have severe onset of pain at beginning of headache, they do not have focal neurologic deficits, and are overall non-toxic in appearance. I do not suspect meningitis with no illness symptoms, no rash, nontoxic appearance, and no meningismus on exam.  CT head obtained which I personally reviewed no evidence of ICH on my review.  No acute intracranial findings per radiology, evidence of old left cerebellar infarct which is known.  Labs reviewed by me generally nonactionable mild leukocytosis 10.6, no other acute findings.  UA positive for amphetamines and THC.  UA was dirty catch with multiple squames present and positive nitrite, choosing not to treat at this time as patient is asymptomatic from urinary standpoint.  Regarding patient's left-sided headache with associated neck pain which I believe is more likely musculoskeletal in nature such as cervical strain or muscle spasm with primary headache such as tension or migraine, I cannot fully rule  out dissection without CTA head and neck.  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  Unfortunately, Patient leaving and signed AMA despite risk of missed dissection, aneurysm that could lead to permanent disability and death.  She is understand the risks of leaving and has capacity . [VB]   Amount and/or Complexity of Data Reviewed Radiology: ordered.  Risk OTC drugs. Prescription drug management.    Final Clinical Impression(s) / ED Diagnoses Final diagnoses:  None    Rx / DC Orders ED Discharge Orders     None         Elgie Congo, MD 12/05/22 2020

## 2022-12-05 NOTE — ED Triage Notes (Signed)
Pt arrived POV from home c/o left sided facial pain, neck pain and a headache x3 days. Pt states it is hard for her to turn her head and the headache has been constant.

## 2022-12-11 ENCOUNTER — Emergency Department (HOSPITAL_COMMUNITY)
Admission: EM | Admit: 2022-12-11 | Discharge: 2022-12-12 | Discharge status: Against Medical Advice | Payer: Medicaid Other | Attending: Emergency Medicine | Admitting: Emergency Medicine

## 2022-12-11 ENCOUNTER — Encounter (HOSPITAL_COMMUNITY): Payer: Self-pay

## 2022-12-11 ENCOUNTER — Other Ambulatory Visit: Payer: Self-pay

## 2022-12-11 DIAGNOSIS — R110 Nausea: Secondary | ICD-10-CM | POA: Insufficient documentation

## 2022-12-11 DIAGNOSIS — Z7902 Long term (current) use of antithrombotics/antiplatelets: Secondary | ICD-10-CM | POA: Insufficient documentation

## 2022-12-11 DIAGNOSIS — Z8673 Personal history of transient ischemic attack (TIA), and cerebral infarction without residual deficits: Secondary | ICD-10-CM | POA: Diagnosis not present

## 2022-12-11 DIAGNOSIS — R519 Headache, unspecified: Secondary | ICD-10-CM | POA: Insufficient documentation

## 2022-12-11 DIAGNOSIS — Z5321 Procedure and treatment not carried out due to patient leaving prior to being seen by health care provider: Secondary | ICD-10-CM | POA: Insufficient documentation

## 2022-12-11 HISTORY — DX: Transient cerebral ischemic attack, unspecified: G45.9

## 2022-12-11 LAB — CBC WITH DIFFERENTIAL/PLATELET
Abs Immature Granulocytes: 0.02 10*3/uL (ref 0.00–0.07)
Basophils Absolute: 0 10*3/uL (ref 0.0–0.1)
Basophils Relative: 0 %
Eosinophils Absolute: 0.2 10*3/uL (ref 0.0–0.5)
Eosinophils Relative: 2 %
HCT: 41.3 % (ref 36.0–46.0)
Hemoglobin: 13.6 g/dL (ref 12.0–15.0)
Immature Granulocytes: 0 %
Lymphocytes Relative: 38 %
Lymphs Abs: 3.8 10*3/uL (ref 0.7–4.0)
MCH: 29.4 pg (ref 26.0–34.0)
MCHC: 32.9 g/dL (ref 30.0–36.0)
MCV: 89.4 fL (ref 80.0–100.0)
Monocytes Absolute: 0.6 10*3/uL (ref 0.1–1.0)
Monocytes Relative: 6 %
Neutro Abs: 5.2 10*3/uL (ref 1.7–7.7)
Neutrophils Relative %: 54 %
Platelets: 220 10*3/uL (ref 150–400)
RBC: 4.62 MIL/uL (ref 3.87–5.11)
RDW: 13.7 % (ref 11.5–15.5)
WBC: 9.8 10*3/uL (ref 4.0–10.5)
nRBC: 0 % (ref 0.0–0.2)

## 2022-12-11 LAB — BASIC METABOLIC PANEL
Anion gap: 12 (ref 5–15)
BUN: 6 mg/dL (ref 6–20)
CO2: 25 mmol/L (ref 22–32)
Calcium: 9.1 mg/dL (ref 8.9–10.3)
Chloride: 102 mmol/L (ref 98–111)
Creatinine, Ser: 0.93 mg/dL (ref 0.44–1.00)
GFR, Estimated: >60 mL/min (ref >60)
Glucose, Bld: 113 mg/dL — ABNORMAL HIGH (ref 70–99)
Potassium: 3.5 mmol/L (ref 3.5–5.1)
Sodium: 139 mmol/L (ref 135–145)

## 2022-12-11 NOTE — ED Provider Triage Note (Signed)
Emergency Medicine Provider Triage Evaluation Note  Theresa [name] , a 32 y.o. female  was evaluated in triage.  Pt complains of severe headache that has been persistent since she was last evaluated in the ED on 1/30.  Patient states that headache never went away and has not changed since that time, however, she opted to leave AMA that day because she did not want an IV placed to obtain CT with IV contrast.  No recent fall or head injury.  Some associated nausea and photophobia but no vomiting, diarrhea, fever, chills, abdominal pain, chest pain, or shortness of breath.  Per discharge summary on 09/26/2022, patient has a history of cryptogenic strokes with no residual deficits and while evaluated by neurology during that admission they believed episodes were concerning for TIAs.  She is anticoagulated on Plavix.  Review of Systems  Positive: See HPI Negative: See HPI  Physical Exam  BP (!) 112/95 (BP Location: Right Arm)   Pulse 74   Temp 98.2 F (36.8 C)   Resp 20   Ht 5' (1.524 m)   Wt 74.8 kg   LMP 11/30/2022   SpO2 100%   BMI 32.22 kg/m  Gen:   Awake, no distress   Resp:  Normal effort lungs clear to auscultation MSK:   Moves extremities without difficulty  Other:  Alert and oriented, 5/5 strength bilateral upper extremities and lower extremities, cranial nerves intact, normal speech, normal gait  Medical Decision Making  Medically screening exam initiated at 6:26 PM.  Appropriate orders placed.  Zaiya L Caster was informed that the remainder of the evaluation will be completed by another provider, this initial triage assessment does not replace that evaluation, and the importance of remaining in the ED until their evaluation is complete.     Suzzette Righter, PA-C 12/11/22 1829

## 2022-12-11 NOTE — ED Triage Notes (Signed)
Pt presents with severe HA that has been going on over a week. Pt has associated photophobia. Pt has had hx of previous CVA. No focal neuro deficits noted during triage.

## 2022-12-11 NOTE — ED Notes (Signed)
Pt called to be roomed multiple times, no answer. Not seen in the lobby.

## 2022-12-12 ENCOUNTER — Institutional Professional Consult (permissible substitution): Payer: Medicaid Other | Admitting: Cardiovascular Disease

## 2022-12-12 NOTE — Progress Notes (Deleted)
Electrophysiology Office Note:    Date:  12/12/2022   ID:  Theresa [name], DOB 10-18-1991, MRN [id number]  PCP:  Patient, No Pcp Per   [facility] Providers Cardiologist:  None { Click to update primary MD,subspecialty MD or APP then REFRESH:1}    Referring MD: No ref. provider found   History of Present Illness:    Theresa [name] is a 32 y.o. female with a hx listed below, significant for recurrent cryptogenic strokes, referred for possible loop recorder implant.  Her most recent ER visit was in November 2023 after an episode of vertigo and word finding difficulty.  The symptoms are similar to prior strokelike episodes.  She was unable to have an MRI during this recent visit, but a prior study in April 07, 2022 showed patchy multifocal acute to early subacute ischemic infarcts involving multiple territories.  Past Medical History:  Diagnosis Date   Anemia    Chlamydia    Depression    Eczema    Gonorrhea    Pyelonephritis    X 2   TIA (transient ischemic attack)     Past Surgical History:  Procedure Laterality Date   BUBBLE STUDY  05/02/2022   Procedure: BUBBLE STUDY;  Surgeon: Pixie Casino, MD;  Location: [place];  Service: Cardiovascular;;   CESAREAN SECTION N/A 05/20/2017   Procedure: CESAREAN SECTION;  Surgeon: Florian Buff, MD;  Location: [place];  Service: Obstetrics;  Laterality: N/A;   NO PAST SURGERIES     TEE WITHOUT CARDIOVERSION N/A 05/02/2022   Procedure: TRANSESOPHAGEAL ECHOCARDIOGRAM (TEE);  Surgeon: Pixie Casino, MD;  Location: [place];  Service: Cardiovascular;  Laterality: N/A;   TUBAL LIGATION Bilateral 05/20/2017   Procedure: BILATERAL TUBAL LIGATION;  Surgeon: Florian Buff, MD;  Location: [place];  Service: Obstetrics;  Laterality: Bilateral;    Current Medications: No outpatient medications have been marked as taking for the 12/12/22 encounter (Appointment) with Charlie Seda, Yetta Barre, MD.     Allergies:    Latex   Social History   Socioeconomic History   Marital status: Single    Spouse name: Not on file   Number of children: Not on file   Years of education: Not on file   Highest education level: Not on file  Occupational History   Not on file  Tobacco Use   Smoking status: Every Day    Types: Cigars   Smokeless tobacco: Former    Quit date: 07/08/2019   Tobacco comments:    4 cig/day  Vaping Use   Vaping Use: Never used  Substance and Sexual Activity   Alcohol use: Yes    Comment: occas. before knowing of pregnancy   Drug use: Yes    Types: Marijuana    Comment: last use one week ago   Sexual activity: Yes    Birth control/protection: None, Surgical  Other Topics Concern   Not on file  Social History Narrative   Not on file   Social Determinants of Health   Financial Resource Strain: Not on file  Food Insecurity: No Food Insecurity (05/11/2020)   Hunger Vital Sign    Worried About Running Out of Food in the Last Year: Never true    Ran Out of Food in the Last Year: Never true  Transportation Needs: Unmet Transportation Needs (05/11/2020)   PRAPARE - Hydrologist (Medical): Yes    Lack of Transportation (Non-Medical): Yes  Physical Activity: Not  on file  Stress: Not on file  Social Connections: Not on file     Family History: The patient's family history includes Asthma in her daughter; Diabetes in her mother; Jimmye Norman syndrome in her son. There is no history of Birth defects, Stroke, Hypertension, Anesthesia problems, Hypotension, Malignant hyperthermia, or Pseudochol deficiency.  ROS:   Please see the history of present illness.    All other systems reviewed and are negative.  EKGs/Labs/Other Studies Reviewed Today:     Event monitor: 11/15/22 No evidence of AF. Symptom episodes correlated to sinus rhythm  EKG:  Last EKG results: ***   Recent Labs: 04/18/2022: Magnesium 2.2 04/19/2022: B Natriuretic Peptide 23.9; TSH  1.856 12/05/2022: ALT 13 12/11/2022: BUN 6; Creatinine, Ser 0.93; Hemoglobin 13.6; Platelets 220; Potassium 3.5; Sodium 139     Physical Exam:    VS:  LMP 11/30/2022     Wt Readings from Last 3 Encounters:  12/11/22 165 lb (74.8 kg)  12/05/22 165 lb (74.8 kg)  09/25/22 156 lb (70.8 kg)     GEN: *** Well nourished, well developed in no acute distress CARDIAC: ***RRR, no murmurs, rubs, gallops RESPIRATORY:  Normal work of breathing MUSCULOSKELETAL: *** edema    ASSESSMENT & PLAN:    Recurrent, multifocal, cryptogenic strokes:         Medication Adjustments/Labs and Tests Ordered: Current medicines are reviewed at length with the patient today.  Concerns regarding medicines are outlined above.  No orders of the defined types were placed in this encounter.  No orders of the defined types were placed in this encounter.    Signed, Melida Quitter, MD  12/12/2022 8:01 AM    [place]

## 2023-01-24 NOTE — Progress Notes (Unsigned)
Electrophysiology Office Note:    Date:  01/25/2023   ID:  Theresa [name], DOB 1991/11/02, MRN [id number]  PCP:  Patient, No Pcp Per   [facility] Providers Cardiologist:  None     Referring MD: No ref. provider found   History of Present Illness:    Theresa [name] is a 32 y.o. female with a hx listed below, significant for recurrent stroke symptoms, referred for arrhythmia management.  She was a history of prior cryptogenic stroke and was admitted in November, 2023 with recurrent stroke symptoms. She was not able to get an MRI due to a permanently attached hair weave.   She has had a thorough evaluation for stroke in June 2023. This included TEE and vascular ultrasound. She wore a monitor in January that did not show any atrial fibrillation.   Past Medical History:  Diagnosis Date   Anemia    Chlamydia    Depression    Eczema    Gonorrhea    Pyelonephritis    X 2   TIA (transient ischemic attack)     Past Surgical History:  Procedure Laterality Date   BUBBLE STUDY  05/02/2022   Procedure: BUBBLE STUDY;  Surgeon: Pixie Casino, MD;  Location: [place];  Service: Cardiovascular;;   CESAREAN SECTION N/A 05/20/2017   Procedure: CESAREAN SECTION;  Surgeon: Florian Buff, MD;  Location: [place];  Service: Obstetrics;  Laterality: N/A;   NO PAST SURGERIES     TEE WITHOUT CARDIOVERSION N/A 05/02/2022   Procedure: TRANSESOPHAGEAL ECHOCARDIOGRAM (TEE);  Surgeon: Pixie Casino, MD;  Location: [place];  Service: Cardiovascular;  Laterality: N/A;   TUBAL LIGATION Bilateral 05/20/2017   Procedure: BILATERAL TUBAL LIGATION;  Surgeon: Florian Buff, MD;  Location: [place];  Service: Obstetrics;  Laterality: Bilateral;    Current Medications: Current Meds  Medication Sig   aspirin 81 MG chewable tablet Chew 1 tablet (81 mg total) by mouth daily.   clopidogrel (PLAVIX) 75 MG tablet Take 1 tablet (75 mg total) by mouth daily.   meclizine  (ANTIVERT) 25 MG tablet Take 1 tablet (25 mg total) by mouth 3 (three) times daily as needed for dizziness.   rosuvastatin (CRESTOR) 10 MG tablet Take 1 tablet (10 mg total) by mouth daily.     Allergies:   Latex   Social and Family History: Reviewed in Epic  ROS:   Please see the history of present illness.    All other systems reviewed and are negative.  EKGs/Labs/Other Studies Reviewed Today:    Echocardiogram:  TTE 09/26/2022  1. Left ventricular ejection fraction, by estimation, is 60 to 65%. Left ventricular ejection fraction by 3D volume is 63 %. The left ventricle has normal function. The left ventricle has no regional wall motion abnormalities. Left ventricular diastolic  parameters were normal.   2. Right ventricular systolic function is normal. The right ventricular size is normal.   3. The mitral valve is normal in structure. No evidence of mitral valve  regurgitation. No evidence of mitral stenosis.   4. The aortic valve is tricuspid. Aortic valve regurgitation is not visualized. No aortic stenosis is present.   Monitors:  11/2022 Monitor worn 8d 10h Sinus rhythm 57-151 bpm, avg 84 bpm <1% ectopy, no significant arrhythmia Symptoms of flutter, skipped beats, shortness of breath, light-headedness correlated with sinus rhythm  Advanced imaging:   EKG:  Last EKG results: today sinus arrhythmia, 83 bpm; RsR' in V1   Recent  Labs: 04/18/2022: Magnesium 2.2 04/19/2022: B Natriuretic Peptide 23.9; TSH 1.856 12/05/2022: ALT 13 12/11/2022: BUN 6; Creatinine, Ser 0.93; Hemoglobin 13.6; Platelets 220; Potassium 3.5; Sodium 139     Physical Exam:    VS:  BP (!) 100/56   Pulse 83   Ht 5' (1.524 m)   Wt 168 lb 3.2 oz (76.3 kg)   SpO2 99%   BMI 32.85 kg/m     Wt Readings from Last 3 Encounters:  01/25/23 168 lb 3.2 oz (76.3 kg)  12/11/22 165 lb (74.8 kg)  12/05/22 165 lb (74.8 kg)     GEN: Well nourished, well developed in no acute distress CARDIAC: RRR, no  murmurs, rubs, gallops RESPIRATORY:  Normal work of breathing MUSCULOSKELETAL: no edema    ASSESSMENT & PLAN:    Recurrent cryptogenic strokes - I recommended ILR. I explained the rationale and logistics of the procedure. I explained monitoring process including the monthly fee.  She would like to proceed.     DESCRIPTION OF PROCEDURE:  Informed written consent was obtained.  A timeout was performed. The patient required no sedation for the procedure today.  The patients left chest was prepped and draped in the usual sterile fashion. The skin overlying the left parasternal region was infiltrated with lidocaine for local analgesia.  A 0.5-cm incision was made over the left parasternal region over the 3rd intercostal space.  A medtronic Link II implantable loop recorder was then inserted using the supplied delivery system.  R waves were very prominent and measured >0.47mV.  Steri- Strips and a sterile dressing were then applied.  There were no early apparent complications.     CONCLUSIONS:   1. Successful implantation of a Metronic linq 2 implantable loop recorder (SN  [id number]) for a history of cryptogenic stroke.   2. No early apparent complications.            Medication Adjustments/Labs and Tests Ordered: Current medicines are reviewed at length with the patient today.  Concerns regarding medicines are outlined above.  Orders Placed This Encounter  Procedures   EKG 12-Lead   No orders of the defined types were placed in this encounter.    Signed, Melida Quitter, MD  01/25/2023 4:22 PM    [place]

## 2023-01-25 ENCOUNTER — Ambulatory Visit: Payer: Medicaid Other | Admitting: Neurology

## 2023-01-25 ENCOUNTER — Encounter: Payer: Self-pay | Admitting: Cardiovascular Disease

## 2023-01-25 ENCOUNTER — Telehealth: Payer: Self-pay | Admitting: Neurology

## 2023-01-25 ENCOUNTER — Encounter: Payer: Self-pay | Admitting: Neurology

## 2023-01-25 ENCOUNTER — Ambulatory Visit: Payer: Medicaid Other | Attending: Cardiovascular Disease | Admitting: Cardiovascular Disease

## 2023-01-25 VITALS — BP 100/56 | HR 83 | Ht 60.0 in | Wt 168.2 lb

## 2023-01-25 DIAGNOSIS — I639 Cerebral infarction, unspecified: Secondary | ICD-10-CM

## 2023-01-25 NOTE — Patient Instructions (Signed)
Medication Instructions:  Your physician recommends that you continue on your current medications as directed. Please refer to the Current Medication list given to you today.  Labwork: None ordered.  Testing/Procedures: None ordered.  Follow-Up:  Your physician wants you to follow-up in: one year with Dr. Gus Mealor.  You will receive a reminder letter in the mail two months in advance. If you don't receive a letter, please call our office to schedule the follow-up appointment.    Implantable Loop Recorder Placement, Care After This sheet gives you information about how to care for yourself after your procedure. Your health care provider may also give you more specific instructions. If you have problems or questions, contact your health care provider. What can I expect after the procedure? After the procedure, it is common to have: Soreness or discomfort near the incision. Some swelling or bruising near the incision.  Follow these instructions at home: Incision care  Monitor your cardiac device site for redness, swelling, and drainage. Call the device clinic at [phone] if you experience these symptoms or fever/chills.  Keep the large square bandage on your site for 24 hours and then you may remove it yourself. Keep the steri-strips underneath in place.   You may shower after 72 hours / 3 days from your procedure with the steri-strips in place. They will usually fall off on their own, or may be removed after 10 days. Pat dry.   Avoid lotions, ointments, or perfumes over your incision until it is well-healed.  Please do not submerge in water until your site is completely healed.   Your device is MRI compatible.   Remote monitoring is used to monitor your cardiac device from home. This monitoring is scheduled every month by our office. It allows us to keep an eye on the function of your device to ensure it is working properly.  If your wound site starts to bleed apply  pressure.    For help with the monitor please call Medtronic Monitor Support Specialist directly at [phone].    If you have any questions/concerns please call the device clinic at [phone].  Activity  Return to your normal activities.  General instructions Follow instructions from your health care provider about how to manage your implantable loop recorder and transmit the information. Learn how to activate a recording if this is necessary for your type of device. You may go through a metal detection gate, and you may let someone hold a metal detector over your chest. Show your ID card if needed. Do not have an MRI unless you check with your health care provider first. Take over-the-counter and prescription medicines only as told by your health care provider. Keep all follow-up visits as told by your health care provider. This is important. Contact a health care provider if: You have redness, swelling, or pain around your incision. You have a fever. You have pain that is not relieved by your pain medicine. You have triggered your device because of fainting (syncope) or because of a heartbeat that feels like it is racing, slow, fluttering, or skipping (palpitations). Get help right away if you have: Chest pain. Difficulty breathing. Summary After the procedure, it is common to have soreness or discomfort near the incision. Change your dressing as told by your health care provider. Follow instructions from your health care provider about how to manage your implantable loop recorder and transmit the information. Keep all follow-up visits as told by your health care provider. This is important. This   information is not intended to replace advice given to you by your health care provider. Make sure you discuss any questions you have with your health care provider. Document Released: 10/04/2015 Document Revised: 12/08/2017 Document Reviewed: 12/08/2017 Elsevier Patient Education   2020 Elsevier Inc.  

## 2023-01-25 NOTE — Telephone Encounter (Signed)
Pt called needing to r/s her appt that she missed today. This will be her second No Show as a New Pt. Review for Dismissal ?

## 2023-01-29 ENCOUNTER — Encounter: Payer: Self-pay | Admitting: Cardiovascular Disease

## 2023-03-05 ENCOUNTER — Ambulatory Visit (INDEPENDENT_AMBULATORY_CARE_PROVIDER_SITE_OTHER): Payer: Medicaid Other

## 2023-03-05 DIAGNOSIS — I639 Cerebral infarction, unspecified: Secondary | ICD-10-CM | POA: Diagnosis not present

## 2023-03-05 LAB — CUP PACEART REMOTE DEVICE CHECK
Date Time Interrogation Session: 20240427143014
Implantable Pulse Generator Implant Date: 20240321

## 2023-04-06 NOTE — Progress Notes (Signed)
Carelink Summary Report / Loop Recorder 

## 2023-04-09 ENCOUNTER — Ambulatory Visit (INDEPENDENT_AMBULATORY_CARE_PROVIDER_SITE_OTHER): Payer: Medicaid Other

## 2023-04-09 DIAGNOSIS — I639 Cerebral infarction, unspecified: Secondary | ICD-10-CM | POA: Diagnosis not present

## 2023-04-09 LAB — CUP PACEART REMOTE DEVICE CHECK
Date Time Interrogation Session: 20240602230816
Implantable Pulse Generator Implant Date: 20240321

## 2023-05-02 NOTE — Progress Notes (Signed)
Carelink Summary Report / Loop Recorder 

## 2023-05-14 ENCOUNTER — Ambulatory Visit (INDEPENDENT_AMBULATORY_CARE_PROVIDER_SITE_OTHER): Payer: Medicaid Other

## 2023-05-14 DIAGNOSIS — I639 Cerebral infarction, unspecified: Secondary | ICD-10-CM | POA: Diagnosis not present

## 2023-05-14 LAB — CUP PACEART REMOTE DEVICE CHECK
Date Time Interrogation Session: 20240705230205
Implantable Pulse Generator Implant Date: 20240321

## 2023-05-31 NOTE — Progress Notes (Signed)
Carelink Summary Report / Loop Recorder 

## 2023-06-06 ENCOUNTER — Telehealth: Payer: Self-pay | Admitting: Family Medicine

## 2023-06-06 NOTE — Telephone Encounter (Signed)
Patient want to talk to someone about her bleeding, state she already had her cycle but she is continuing to spot.

## 2023-06-07 NOTE — Telephone Encounter (Signed)
Called patient, no answer- left message stating we are trying to reach you to return your phone call, please call us back 

## 2023-06-14 ENCOUNTER — Other Ambulatory Visit: Payer: Self-pay

## 2023-06-14 ENCOUNTER — Other Ambulatory Visit (HOSPITAL_COMMUNITY)
Admission: RE | Admit: 2023-06-14 | Discharge: 2023-06-14 | Disposition: A | Discharge status: Home / Self Care | Payer: Medicaid Other | Source: Ambulatory Visit | Attending: Obstetrics & Gynecology | Admitting: Obstetrics & Gynecology

## 2023-06-14 ENCOUNTER — Encounter: Payer: Self-pay | Admitting: Obstetrics & Gynecology

## 2023-06-14 ENCOUNTER — Ambulatory Visit (INDEPENDENT_AMBULATORY_CARE_PROVIDER_SITE_OTHER): Payer: Medicaid Other | Admitting: Obstetrics & Gynecology

## 2023-06-14 VITALS — BP 117/66 | HR 82 | Ht 60.0 in | Wt 163.5 lb

## 2023-06-14 DIAGNOSIS — Z124 Encounter for screening for malignant neoplasm of cervix: Secondary | ICD-10-CM | POA: Insufficient documentation

## 2023-06-14 DIAGNOSIS — N3000 Acute cystitis without hematuria: Secondary | ICD-10-CM | POA: Diagnosis not present

## 2023-06-14 DIAGNOSIS — N898 Other specified noninflammatory disorders of vagina: Secondary | ICD-10-CM | POA: Diagnosis not present

## 2023-06-14 MED ORDER — SULFAMETHOXAZOLE-TRIMETHOPRIM 800-160 MG PO TABS
1.0000 | ORAL_TABLET | Freq: Two times a day (BID) | ORAL | 1 refills | Status: DC
Start: 2023-06-14 — End: 2024-03-17

## 2023-06-14 NOTE — Progress Notes (Signed)
Patient ID: Theresa [name], female   DOB: Mar 25, 1991, 32 y.o.   MRN: [id number]  Chief Complaint  Patient presents with   Gynecologic Exam    HPI Theresa [name] is a 32 y.o. female.  [id number] Patient's last menstrual period was 05/13/2023. Currently notes vaginal bleeding for 2 weeks not like her normal menses and is mixed with discharge. Dysuria, o/w no pain HPI  Past Medical History:  Diagnosis Date   Anemia    Chlamydia    Depression    Eczema    Gonorrhea    Pyelonephritis    X 2   TIA (transient ischemic attack)     Past Surgical History:  Procedure Laterality Date   BUBBLE STUDY  05/02/2022   Procedure: BUBBLE STUDY;  Surgeon: Chrystie Nose, MD;  Location: [facility] ENDOSCOPY;  Service: Cardiovascular;;   CESAREAN SECTION N/A 05/20/2017   Procedure: CESAREAN SECTION;  Surgeon: Lazaro Arms, MD;  Location: [facility];  Service: Obstetrics;  Laterality: N/A;   NO PAST SURGERIES     TEE WITHOUT CARDIOVERSION N/A 05/02/2022   Procedure: TRANSESOPHAGEAL ECHOCARDIOGRAM (TEE);  Surgeon: Chrystie Nose, MD;  Location: [facility] ENDOSCOPY;  Service: Cardiovascular;  Laterality: N/A;   TUBAL LIGATION Bilateral 05/20/2017   Procedure: BILATERAL TUBAL LIGATION;  Surgeon: Lazaro Arms, MD;  Location: [facility] BIRTHING SUIT[facility];  Service: Obstetrics;  Laterality: Bilateral;    Family History  Problem Relation Age of Onset   Diabetes Mother    Asthma Daughter    Mayford Knife syndrome Son    Birth defects Neg Hx    Stroke Neg Hx    Hypertension Neg Hx    Anesthesia problems Neg Hx    Hypotension Neg Hx    Malignant hyperthermia Neg Hx    Pseudochol deficiency Neg Hx     Social History Social History   Tobacco Use   Smoking status: Every Day    Types: Cigars   Smokeless tobacco: Former    Quit date: 07/08/2019   Tobacco comments:    4 cig/day  Vaping Use   Vaping status: Never Used  Substance Use Topics   Alcohol use: Yes    Comment: occas. before knowing of pregnancy   Drug  use: Yes    Types: Marijuana    Comment: last use one week ago    Allergies  Allergen Reactions   Latex Itching, Swelling and Other (See Comments)    Reaction:  Localized swelling    Current Outpatient Medications  Medication Sig Dispense Refill   aspirin 81 MG chewable tablet Chew 1 tablet (81 mg total) by mouth daily. 30 tablet 1   clopidogrel (PLAVIX) 75 MG tablet Take 1 tablet (75 mg total) by mouth daily. 21 tablet 0   meclizine (ANTIVERT) 25 MG tablet Take 1 tablet (25 mg total) by mouth 3 (three) times daily as needed for dizziness. 30 tablet 0   rosuvastatin (CRESTOR) 10 MG tablet Take 1 tablet (10 mg total) by mouth daily. 30 tablet 0   sulfamethoxazole-trimethoprim (BACTRIM DS) 800-160 MG tablet Take 1 tablet by mouth 2 (two) times daily. 6 tablet 1   No current facility-administered medications for this visit.    Review of Systems Review of Systems  Constitutional: Negative.   Respiratory: Negative.    Cardiovascular: Negative.   Gastrointestinal: Negative.   Genitourinary:  Positive for dysuria, vaginal bleeding and vaginal discharge.    Blood pressure 117/66, pulse 82, height 5' (1.524 m), weight 163 lb 8 oz (  74.2 kg), last menstrual period 05/13/2023, unknown if currently breastfeeding.  Physical Exam Physical Exam Vitals and nursing note reviewed. Exam conducted with a chaperone present.  Constitutional:      Appearance: Normal appearance.  Cardiovascular:     Rate and Rhythm: Normal rate.  Pulmonary:     Effort: Pulmonary effort is normal.  Abdominal:     General: Abdomen is flat.     Palpations: Abdomen is soft.  Genitourinary:    General: Normal vulva.     Exam position: Lithotomy position.     Vagina: Vaginal discharge (blood stained) present.     Cervix: Discharge present. No cervical motion tenderness.     Uterus: Normal.      Adnexa: Right adnexa normal and left adnexa normal.  Skin:    General: Skin is warm and dry.  Neurological:      Mental Status: She is alert.  Psychiatric:        Mood and Affect: Mood normal.        Behavior: Behavior normal.     Data Reviewed Pap nl 2028  Assessment Cervical cancer screening - Plan: Cytology - PAP( [facility])  Vaginal discharge  Acute cystitis without hematuria - Plan: sulfamethoxazole-trimethoprim (BACTRIM DS) 800-160 MG tablet   Plan F/u on pap and STD testing Meds ordered this encounter  Medications   sulfamethoxazole-trimethoprim (BACTRIM DS) 800-160 MG tablet    Sig: Take 1 tablet by mouth 2 (two) times daily.    Dispense:  6 tablet    Refill:  1       Scheryl Darter 06/14/2023, 4:26 PM

## 2023-06-18 ENCOUNTER — Ambulatory Visit: Payer: Medicaid Other

## 2023-06-18 DIAGNOSIS — I639 Cerebral infarction, unspecified: Secondary | ICD-10-CM | POA: Diagnosis not present

## 2023-06-19 ENCOUNTER — Encounter: Payer: Self-pay | Admitting: Obstetrics & Gynecology

## 2023-06-20 ENCOUNTER — Other Ambulatory Visit: Payer: Self-pay | Admitting: *Deleted

## 2023-06-20 DIAGNOSIS — A599 Trichomoniasis, unspecified: Secondary | ICD-10-CM

## 2023-06-20 MED ORDER — METRONIDAZOLE 500 MG PO TABS: 500.0000 mg | ORAL_TABLET | Freq: Two times a day (BID) | ORAL | 0 refills | Status: DC

## 2023-07-03 NOTE — Progress Notes (Signed)
Carelink Summary Report / Loop Recorder 

## 2023-07-23 ENCOUNTER — Ambulatory Visit (INDEPENDENT_AMBULATORY_CARE_PROVIDER_SITE_OTHER): Payer: Medicaid Other

## 2023-07-23 DIAGNOSIS — I639 Cerebral infarction, unspecified: Secondary | ICD-10-CM | POA: Diagnosis not present

## 2023-07-24 LAB — CUP PACEART REMOTE DEVICE CHECK
Date Time Interrogation Session: 20240913230800
Implantable Pulse Generator Implant Date: 20240321

## 2023-08-08 NOTE — Progress Notes (Signed)
Carelink Summary Report / Loop Recorder 

## 2023-08-27 ENCOUNTER — Ambulatory Visit: Payer: Medicaid Other

## 2023-08-27 DIAGNOSIS — I639 Cerebral infarction, unspecified: Secondary | ICD-10-CM | POA: Diagnosis not present

## 2023-08-27 LAB — CUP PACEART REMOTE DEVICE CHECK
Date Time Interrogation Session: 20241020231816
Implantable Pulse Generator Implant Date: 20240321

## 2023-09-13 NOTE — Progress Notes (Signed)
Carelink Summary Report / Loop Recorder 

## 2023-10-01 ENCOUNTER — Ambulatory Visit (INDEPENDENT_AMBULATORY_CARE_PROVIDER_SITE_OTHER): Payer: Medicaid Other

## 2023-10-01 DIAGNOSIS — I639 Cerebral infarction, unspecified: Secondary | ICD-10-CM | POA: Diagnosis not present

## 2023-10-01 LAB — CUP PACEART REMOTE DEVICE CHECK
Date Time Interrogation Session: 20241122230846
Implantable Pulse Generator Implant Date: 20240321

## 2023-10-29 ENCOUNTER — Encounter (HOSPITAL_COMMUNITY): Payer: Self-pay

## 2023-10-29 ENCOUNTER — Emergency Department (HOSPITAL_COMMUNITY)
Admission: EM | Admit: 2023-10-29 | Discharge: 2023-10-29 | Disposition: A | Discharge status: Home / Self Care | Payer: Medicaid Other | Attending: Emergency Medicine | Admitting: Emergency Medicine

## 2023-10-29 ENCOUNTER — Other Ambulatory Visit: Payer: Self-pay

## 2023-10-29 DIAGNOSIS — Z7982 Long term (current) use of aspirin: Secondary | ICD-10-CM | POA: Diagnosis not present

## 2023-10-29 DIAGNOSIS — Z1152 Encounter for screening for COVID-19: Secondary | ICD-10-CM | POA: Insufficient documentation

## 2023-10-29 DIAGNOSIS — Z79899 Other long term (current) drug therapy: Secondary | ICD-10-CM | POA: Insufficient documentation

## 2023-10-29 DIAGNOSIS — Z8673 Personal history of transient ischemic attack (TIA), and cerebral infarction without residual deficits: Secondary | ICD-10-CM | POA: Diagnosis not present

## 2023-10-29 DIAGNOSIS — Z91040 Latex allergy status: Secondary | ICD-10-CM | POA: Diagnosis not present

## 2023-10-29 DIAGNOSIS — Z7902 Long term (current) use of antithrombotics/antiplatelets: Secondary | ICD-10-CM | POA: Insufficient documentation

## 2023-10-29 DIAGNOSIS — J020 Streptococcal pharyngitis: Secondary | ICD-10-CM | POA: Diagnosis not present

## 2023-10-29 DIAGNOSIS — J029 Acute pharyngitis, unspecified: Secondary | ICD-10-CM | POA: Diagnosis present

## 2023-10-29 LAB — RESP PANEL BY RT-PCR (RSV, FLU A&B, COVID)  RVPGX2
Influenza A by PCR: NEGATIVE
Influenza B by PCR: NEGATIVE
Resp Syncytial Virus by PCR: NEGATIVE
SARS Coronavirus 2 by RT PCR: NEGATIVE

## 2023-10-29 LAB — GROUP A STREP BY PCR: Group A Strep by PCR: DETECTED — AB

## 2023-10-29 MED ORDER — AMOXICILLIN 500 MG PO CAPS: 500.0000 mg | ORAL_CAPSULE | Freq: Two times a day (BID) | ORAL | 0 refills | Status: AC

## 2023-10-29 NOTE — ED Provider Triage Note (Signed)
Emergency Medicine Provider Triage Evaluation Note  Theresa [name] , a 32 y.o. female  was evaluated in triage.  Pt complains of a sore throat  Review of Systems  Positive: Sore throat Negative: fever  Physical Exam  BP 110/64 (BP Location: Right Arm)   Pulse 87   Temp 98 F (36.7 C)   Resp 16   Ht 5' (1.524 m)   Wt 74.8 kg   SpO2 100%   BMI 32.22 kg/m  Gen:   Awake, no distress   Resp:  Normal effort  MSK:   Moves extremities without difficulty  Other:  Swelling tonsils thrato erythematous  Medical Decision Making  Medically screening exam initiated at 2:57 PM.  Appropriate orders placed.  Theresa [name] was informed that the remainder of the evaluation will be completed by another provider, this initial triage assessment does not replace that evaluation, and the importance of remaining in the ED until their evaluation is complete.     Elson Areas, [place] 10/29/23 1457

## 2023-10-29 NOTE — Discharge Instructions (Addendum)
Thank you for letting us evaluate you today.  You are positive for strep.  I have sent antibiotic to your pharmacy.  Please take this as soon as you get home tonight then twice a day for the next 10 days.  This may cause mild GI upset you can take probiotic yogurt if needed.  You may use Tylenol and hot tea for symptoms.  You should start to feel better within 24 to 48 hours.  Please make sure to throw away your toothbrush within 2 to 3 days of taking antibiotic as you can reinfect yourself.  Do not share drinks with other individuals as you can infect them or reinfect yourself.  Please make sure to take the entire antibiotic course.  Return to emergency department if you experience difficulty swallowing, significant worsening of symptoms.

## 2023-10-29 NOTE — ED Provider Notes (Signed)
[place] EMERGENCY DEPARTMENT AT [facility] Provider Note   CSN: [id number] Arrival date & time: 10/29/23  1349     History  Chief Complaint  Patient presents with   Sore Throat    Theresa [name] is a 32 y.o. female with a past medical history of anemia, depression, pyelonephritis, TIA presents to emergency department for evaluation of sore throat for the past 2 days.  She reports that her friend and her daughter both are positive for strep throat.  She denies fevers, difficulty swallowing.   Sore Throat Pertinent negatives include no chest pain, no abdominal pain, no headaches and no shortness of breath.     Home Medications Prior to Admission medications   Medication Sig Start Date End Date Taking? Authorizing Provider  amoxicillin (AMOXIL) 500 MG capsule Take 1 capsule (500 mg total) by mouth 2 (two) times daily for 10 days. 10/29/23 11/08/23 Yes Judithann Sheen, PA  aspirin 81 MG chewable tablet Chew 1 tablet (81 mg total) by mouth daily. 09/27/22   Hongalgi, Maximino Greenland, MD  clopidogrel (PLAVIX) 75 MG tablet Take 1 tablet (75 mg total) by mouth daily. 09/26/22   Hongalgi, Maximino Greenland, MD  meclizine (ANTIVERT) 25 MG tablet Take 1 tablet (25 mg total) by mouth 3 (three) times daily as needed for dizziness. 05/02/22   Rhetta Mura, MD  metroNIDAZOLE (FLAGYL) 500 MG tablet Take 1 tablet (500 mg total) by mouth 2 (two) times daily. 06/20/23   Hermina Staggers, MD  rosuvastatin (CRESTOR) 10 MG tablet Take 1 tablet (10 mg total) by mouth daily. 09/26/22   Hongalgi, Maximino Greenland, MD  sulfamethoxazole-trimethoprim (BACTRIM DS) 800-160 MG tablet Take 1 tablet by mouth 2 (two) times daily. 06/14/23   Adam Phenix, MD      Allergies    Latex    Review of Systems   Review of Systems  Constitutional:  Negative for chills, fatigue and fever.  HENT:  Positive for sore throat.   Respiratory:  Negative for cough, chest tightness, shortness of breath and wheezing.   Cardiovascular:   Negative for chest pain and palpitations.  Gastrointestinal:  Negative for abdominal pain, constipation, diarrhea, nausea and vomiting.  Neurological:  Negative for dizziness, seizures, weakness, light-headedness, numbness and headaches.    Physical Exam Updated Vital Signs BP 95/76   Pulse 84   Temp 98.7 F (37.1 C)   Resp 14   Ht 5' (1.524 m)   Wt 74.8 kg   SpO2 100%   BMI 32.22 kg/m  Physical Exam Vitals and nursing note reviewed.  Constitutional:      General: She is not in acute distress.    Appearance: Normal appearance. She is not ill-appearing.  HENT:     Head: Normocephalic and atraumatic.     Jaw: No trismus, tenderness or swelling.     Right Ear: Tympanic membrane, ear canal and external ear normal.     Left Ear: Tympanic membrane, ear canal and external ear normal.     Nose: No congestion or rhinorrhea.     Mouth/Throat:     Mouth: Mucous membranes are moist.     Dentition: Normal dentition. No dental abscesses.     Pharynx: Uvula midline. Posterior oropharyngeal erythema present. No oropharyngeal exudate or uvula swelling.     Tonsils: No tonsillar exudate or tonsillar abscesses. 2+ on the right. 2+ on the left.     Comments: Uvula midline. No abscess, fluctuance, erythema noted in mouth Eyes:  General: No scleral icterus.       Right eye: No discharge.        Left eye: No discharge.     Conjunctiva/sclera: Conjunctivae normal.  Cardiovascular:     Rate and Rhythm: Normal rate.     Pulses: Normal pulses.  Pulmonary:     Effort: Pulmonary effort is normal. No respiratory distress.     Breath sounds: Normal breath sounds. No stridor. No wheezing or rhonchi.  Chest:     Chest wall: No tenderness.  Abdominal:     General: There is no distension.     Palpations: Abdomen is soft. There is no mass.     Tenderness: There is no abdominal tenderness. There is no guarding.  Musculoskeletal:     Cervical back: Normal range of motion and neck supple. No rigidity  or tenderness.  Lymphadenopathy:     Cervical: No cervical adenopathy.  Skin:    General: Skin is warm.     Capillary Refill: Capillary refill takes less than 2 seconds.     Coloration: Skin is not jaundiced or pale.  Neurological:     Mental Status: She is alert. Mental status is at baseline.    ED Results / Procedures / Treatments   Labs (all labs ordered are listed, but only abnormal results are displayed) Labs Reviewed  GROUP A STREP BY PCR - Abnormal; Notable for the following components:      Result Value   Group A Strep by PCR DETECTED (*)    All other components within normal limits  RESP PANEL BY RT-PCR (RSV, FLU A&B, COVID)  RVPGX2  URINALYSIS, ROUTINE W REFLEX MICROSCOPIC    EKG None  Radiology No results found.  Procedures Procedures    Medications Ordered in ED Medications - No data to display  ED Course/ Medical Decision Making/ A&P                                 Medical Decision Making Risk Prescription drug management.  Patient presents to the ED for concern of sore throat, this involves an extensive number of treatment options, and is a complaint that carries with it a high risk of complications and morbidity.  The differential diagnosis includes strep, pharyngitis, infection, abscess, Ludwick's, PTA, epiglottitis, thrush.   Co morbidities that complicate the patient evaluation  anemia, depression, pyelonephritis, TIA   Additional history obtained:  Additional history obtained from Nursing and Outside Medical Records   External records from outside source obtained and reviewed including     Lab Tests:  I Ordered, and personally interpreted labs.  The pertinent results include:   Strep positive Respiratory panel negative   Medicines ordered and prescription drug management:  I ordered medication including amoxicillin for strep pharyngitis I have reviewed the patients home medicines and have made adjustments as  needed    Problem List / ED Course:  Strep pharyngitis Provided prescription for amoxicillin -discussed antibiotic stewardship Discussed limiting sharing of drinks and throwing away toothbrush to avoid reinfection Discussed return to emergency department precautions.  All questions answered to her satisfaction.  She is agreeable with discharge.   Reevaluation:  After the interventions noted above, I reevaluated the patient and found that they have :improved   Dispostion:  After consideration of the diagnostic results and the patients response to treatment, I feel that the patent would benefit from outpatient management with amoxicillin.   Final Clinical  Impression(s) / ED Diagnoses Final diagnoses:  Strep pharyngitis    Rx / DC Orders ED Discharge Orders          Ordered    amoxicillin (AMOXIL) 500 MG capsule  2 times daily        10/29/23 1744             Judithann Sheen, PA 10/29/23 1814    Alvira Monday, MD 10/30/23 [phone]

## 2023-10-29 NOTE — ED Triage Notes (Signed)
Pt c/o headache, body aches, sore throat, fever and ear pain. Pt states her family has strep throat.

## 2023-11-01 NOTE — Progress Notes (Signed)
Carelink Summary Report / Loop Recorder 

## 2023-11-05 ENCOUNTER — Ambulatory Visit (INDEPENDENT_AMBULATORY_CARE_PROVIDER_SITE_OTHER): Payer: Medicaid Other

## 2023-11-05 DIAGNOSIS — I639 Cerebral infarction, unspecified: Secondary | ICD-10-CM

## 2023-11-05 LAB — CUP PACEART REMOTE DEVICE CHECK
Date Time Interrogation Session: 20241227231110
Implantable Pulse Generator Implant Date: 20240321

## 2023-12-10 ENCOUNTER — Ambulatory Visit (INDEPENDENT_AMBULATORY_CARE_PROVIDER_SITE_OTHER): Payer: Medicaid Other

## 2023-12-10 DIAGNOSIS — I639 Cerebral infarction, unspecified: Secondary | ICD-10-CM | POA: Diagnosis not present

## 2023-12-10 LAB — CUP PACEART REMOTE DEVICE CHECK
Date Time Interrogation Session: 20250131230909
Implantable Pulse Generator Implant Date: 20240321

## 2024-01-14 ENCOUNTER — Ambulatory Visit: Payer: Medicaid Other

## 2024-01-14 DIAGNOSIS — I639 Cerebral infarction, unspecified: Secondary | ICD-10-CM

## 2024-01-14 LAB — CUP PACEART REMOTE DEVICE CHECK
Date Time Interrogation Session: 20250309231906
Implantable Pulse Generator Implant Date: 20240321

## 2024-01-17 NOTE — Progress Notes (Signed)
 [facility] Summary Report / Loop Recorder

## 2024-01-22 ENCOUNTER — Encounter: Payer: Self-pay | Admitting: Cardiovascular Disease

## 2024-01-25 ENCOUNTER — Ambulatory Visit: Payer: Medicaid Other | Attending: Cardiovascular Disease | Admitting: Pulmonary Disease

## 2024-01-25 NOTE — Progress Notes (Deleted)
  Electrophysiology Office Note:   Date:  01/25/2024  ID:  Theresa [name], DOB September 02, 1991, MRN [id number]  Primary Cardiologist: None Primary Heart Failure: None Electrophysiologist: None  {Click to update primary MD,subspecialty MD or APP then REFRESH:1}    History of Present Illness:   Theresa [name] is a 33 y.o. female with h/o bradycardia, cryptogenic CVA s/p ILR, depression, THC use, obesity  seen today for routine electrophysiology followup.   Since last being seen in our clinic the patient reports doing ***.  she denies chest pain, palpitations, dyspnea, PND, orthopnea, nausea, vomiting, dizziness, syncope, edema, weight gain, or early satiety.   Review of systems complete and found to be negative unless listed in HPI.   EP Information / Studies Reviewed:    EKG is not ordered today. EKG from 01/25/23 reviewed which showed NSR 83 bpm      Studies:  ECHO 09/2022 > LVEF 60-65%, normal wall motion, no valvular disease   Cardiac Monitor 11/2022 > SR 57-151 bpm, ave 84 bpm, <1% ectopy, no significant arrhythmia, all pt triggered symptoms correlated with SR   Device:  MDT LINQ II, implanted 02/04/23 for cryptogenic stroke     Risk Assessment/Calculations:   {Does this patient have ATRIAL FIBRILLATION?:[id number]} No BP recorded.  {Refresh Note OR Click here to enter BP  :1}***        Physical Exam:   VS:  There were no vitals taken for this visit.   Wt Readings from Last 3 Encounters:  10/29/23 165 lb (74.8 kg)  06/14/23 163 lb 8 oz (74.2 kg)  01/25/23 168 lb 3.2 oz (76.3 kg)     GEN: Well nourished, well developed in no acute distress NECK: No JVD; No carotid bruits CARDIAC: {EPRHYTHM:28826}, no murmurs, rubs, gallops RESPIRATORY:  Clear to auscultation without rales, wheezing or rhonchi  ABDOMEN: Soft, non-tender, non-distended EXTREMITIES:  No edema; No deformity   ASSESSMENT AND PLAN:    Cryptogenic Stroke  S/p ILR  -  Follow up with {JYNWG:95621} {EPFOLLOW  HY:86578}  Signed, Canary Brim, NP-C, AGACNP-BC [facility] - Electrophysiology  01/25/2024, 12:51 PM

## 2024-01-28 ENCOUNTER — Encounter: Payer: Self-pay | Admitting: Pulmonary Disease

## 2024-02-14 ENCOUNTER — Other Ambulatory Visit: Payer: Self-pay

## 2024-02-14 ENCOUNTER — Emergency Department (HOSPITAL_COMMUNITY)
Admission: EM | Admit: 2024-02-14 | Discharge: 2024-02-14 | Disposition: A | Discharge status: Home / Self Care | Attending: Emergency Medicine | Admitting: Emergency Medicine

## 2024-02-14 DIAGNOSIS — Z91040 Latex allergy status: Secondary | ICD-10-CM | POA: Insufficient documentation

## 2024-02-14 DIAGNOSIS — H1031 Unspecified acute conjunctivitis, right eye: Secondary | ICD-10-CM | POA: Diagnosis not present

## 2024-02-14 DIAGNOSIS — Z8673 Personal history of transient ischemic attack (TIA), and cerebral infarction without residual deficits: Secondary | ICD-10-CM | POA: Insufficient documentation

## 2024-02-14 DIAGNOSIS — H5789 Other specified disorders of eye and adnexa: Secondary | ICD-10-CM | POA: Diagnosis present

## 2024-02-14 DIAGNOSIS — Z7982 Long term (current) use of aspirin: Secondary | ICD-10-CM | POA: Insufficient documentation

## 2024-02-14 LAB — COMPREHENSIVE METABOLIC PANEL WITH GFR
ALT: 15 U/L (ref 0–44)
AST: 23 U/L (ref 15–41)
Albumin: 3.8 g/dL (ref 3.5–5.0)
Alkaline Phosphatase: 66 U/L (ref 38–126)
Anion gap: 9 (ref 5–15)
BUN: 7 mg/dL (ref 6–20)
CO2: 25 mmol/L (ref 22–32)
Calcium: 9.2 mg/dL (ref 8.9–10.3)
Chloride: 105 mmol/L (ref 98–111)
Creatinine, Ser: 0.85 mg/dL (ref 0.44–1.00)
GFR, Estimated: >60 mL/min (ref >60)
Glucose, Bld: 112 mg/dL — ABNORMAL HIGH (ref 70–99)
Potassium: 3.8 mmol/L (ref 3.5–5.1)
Sodium: 139 mmol/L (ref 135–145)
Total Bilirubin: 0.6 mg/dL (ref 0.0–1.2)
Total Protein: 7.9 g/dL (ref 6.5–8.1)

## 2024-02-14 LAB — CBC WITH DIFFERENTIAL/PLATELET
Abs Immature Granulocytes: 0 10*3/uL (ref 0.00–0.07)
Basophils Absolute: 0 10*3/uL (ref 0.0–0.1)
Basophils Relative: 0 %
Eosinophils Absolute: 0.3 10*3/uL (ref 0.0–0.5)
Eosinophils Relative: 2 %
HCT: 42.7 % (ref 36.0–46.0)
Hemoglobin: 14.1 g/dL (ref 12.0–15.0)
Lymphocytes Relative: 47 %
Lymphs Abs: 6.3 10*3/uL — ABNORMAL HIGH (ref 0.7–4.0)
MCH: 30.3 pg (ref 26.0–34.0)
MCHC: 33 g/dL (ref 30.0–36.0)
MCV: 91.6 fL (ref 80.0–100.0)
Monocytes Absolute: 0.5 10*3/uL (ref 0.1–1.0)
Monocytes Relative: 4 %
Neutro Abs: 6.3 10*3/uL (ref 1.7–7.7)
Neutrophils Relative %: 47 %
Platelets: 259 10*3/uL (ref 150–400)
RBC: 4.66 MIL/uL (ref 3.87–5.11)
RDW: 13.7 % (ref 11.5–15.5)
WBC: 13.5 10*3/uL — ABNORMAL HIGH (ref 4.0–10.5)
nRBC: 0 % (ref 0.0–0.2)
nRBC: 0 /100{WBCs}

## 2024-02-14 MED ORDER — FLUORESCEIN SODIUM 1 MG OP STRP
1.0000 | ORAL_STRIP | Freq: Once | OPHTHALMIC | Status: AC
  Administered 2024-02-14: 1 via OPHTHALMIC
  Filled 2024-02-14: qty 1

## 2024-02-14 MED ORDER — ERYTHROMYCIN 5 MG/GM OP OINT
1.0000 | TOPICAL_OINTMENT | Freq: Once | OPHTHALMIC | Status: AC
  Administered 2024-02-14: 1 via OPHTHALMIC
  Filled 2024-02-14: qty 3.5

## 2024-02-14 MED ORDER — TETRACAINE HCL 0.5 % OP SOLN
1.0000 [drp] | Freq: Once | OPHTHALMIC | Status: AC
  Administered 2024-02-14: 1 [drp] via OPHTHALMIC
  Filled 2024-02-14: qty 4

## 2024-02-14 NOTE — ED Triage Notes (Signed)
 Patient reports persistent right eye redness for 2 weeks , denies injury /no vision loss.

## 2024-02-14 NOTE — ED Provider Triage Note (Addendum)
 Emergency Medicine Provider Triage Evaluation Note  Theresa [name] , a 33 y.o. female  was evaluated in triage.  Pt complains of eye irritation. Patient reports right sided eye irritation ongoing for about 2 weeks. States she tried OTC drops with no relief. No involvement of left eye. Denies eye discharge or drainage. No fevers, chills, or bodyaches. Has had some headaches but no improvement.  Review of Systems  Positive: As above Negative: As above  Physical Exam  BP 110/63 (BP Location: Right Arm)   Pulse 93   Temp 98.3 F (36.8 C)   Resp 16   SpO2 100%  Gen:   Awake, no distress   Resp:  Normal effort  MSK:   Moves extremities without difficulty  Other:  Right eye with conjunctival injection but no discharge present. No orbital swelling appreciated.  Medical Decision Making  Medically screening exam initiated at 7:43 PM.  Appropriate orders placed.  Theresa [name] was informed that the remainder of the evaluation will be completed by another provider, this initial triage assessment does not replace that evaluation, and the importance of remaining in the ED until their evaluation is complete.  Advised patient that this is likely conjunctivitis from appearance, but patient is adamant to have labs at a minimum checked. Added CBC and CMP. No indication for imaging of the eyes at this time.   Smitty Knudsen, PA-C 02/14/24 1943    Smitty Knudsen, PA-C 02/14/24 1944

## 2024-02-14 NOTE — Discharge Instructions (Addendum)
 You were seen in the ED today for redness of the right eye.  You likely have an infection responsible for your symptoms.  You were given an ointment to the right eye, which she can apply twice a day.  Please make sure to follow-up with an ophthalmologist in the next 3 days, their information is in this paperwork.  Please return to ED if experience worsening pain with loss of vision or of any other reason to seek emergency medical care.

## 2024-02-14 NOTE — ED Provider Notes (Signed)
 [place] EMERGENCY DEPARTMENT AT [facility] Provider Note   CSN: [id number] Arrival date & time: 02/14/24  1841     History  Chief Complaint  Patient presents with   Conjunctivitis    Theresa [name] is a 33 y.o. female with past medical history anemia, depression, TIA presents due to right eye redness.  Patient states that over the last 2 weeks, she has had conjunctivitis with intermittent pain to the right eye.  Denies any significant blurred vision.  Endorses discomfort with extraocular movements at times.  Denies any fevers.  Denies any trauma to the eye.  Does not wear any contact lenses.  Denies any fevers.   Conjunctivitis       Home Medications Prior to Admission medications   Medication Sig Start Date End Date Taking? Authorizing Provider  aspirin  81 MG chewable tablet Chew 1 tablet (81 mg total) by mouth daily. 09/27/22   Hongalgi, Anand D, MD  clopidogrel  (PLAVIX ) 75 MG tablet Take 1 tablet (75 mg total) by mouth daily. 09/26/22   Hongalgi, Anand D, MD  meclizine  (ANTIVERT ) 25 MG tablet Take 1 tablet (25 mg total) by mouth 3 (three) times daily as needed for dizziness. 05/02/22   Samtani, Jai-Gurmukh, MD  metroNIDAZOLE  (FLAGYL ) 500 MG tablet Take 1 tablet (500 mg total) by mouth 2 (two) times daily. 06/20/23   Ervin, Michael L, MD  rosuvastatin  (CRESTOR ) 10 MG tablet Take 1 tablet (10 mg total) by mouth daily. 09/26/22   Hongalgi, Anand D, MD  sulfamethoxazole -trimethoprim  (BACTRIM  DS) 800-160 MG tablet Take 1 tablet by mouth 2 (two) times daily. 06/14/23   Tresia Fruit, MD      Allergies    Latex    Review of Systems   Review of Systems  Physical Exam Updated Vital Signs BP 133/62   Pulse 72   Temp 98.4 F (36.9 C) (Oral)   Resp 16   SpO2 100%  Physical Exam Vitals and nursing note reviewed.  Constitutional:      General: She is not in acute distress.    Appearance: She is well-developed.  HENT:     Head: Normocephalic and atraumatic.   Eyes:     Comments: Right hand with conjunctival injection, chemosis to the lateral portion.  EOMI with discomfort, PERRL, IOP 16, Woods lamp with no corneal abrasion  Cardiovascular:     Rate and Rhythm: Normal rate and regular rhythm.     Heart sounds: No murmur heard. Pulmonary:     Effort: Pulmonary effort is normal. No respiratory distress.  Abdominal:     Palpations: Abdomen is soft.  Musculoskeletal:        General: No swelling.     Cervical back: Neck supple.  Skin:    General: Skin is warm and dry.     Capillary Refill: Capillary refill takes less than 2 seconds.  Neurological:     Mental Status: She is alert.  Psychiatric:        Mood and Affect: Mood normal.     ED Results / Procedures / Treatments   Labs (all labs ordered are listed, but only abnormal results are displayed) Labs Reviewed  CBC WITH DIFFERENTIAL/PLATELET - Abnormal; Notable for the following components:      Result Value   WBC 13.5 (*)    Lymphs Abs 6.3 (*)    All other components within normal limits  COMPREHENSIVE METABOLIC PANEL WITH GFR - Abnormal; Notable for the following components:   Glucose, Bld  112 (*)    All other components within normal limits    EKG None  Radiology No results found.  Procedures Procedures    Medications Ordered in ED Medications  tetracaine  (PONTOCAINE) 0.5 % ophthalmic solution 1 drop (1 drop Right Eye Given 02/14/24 2124)  fluorescein  ophthalmic strip 1 strip (1 strip Right Eye Given 02/14/24 2124)  erythromycin  ophthalmic ointment 1 Application (1 Application Right Eye Given 02/14/24 2238)    ED Course/ Medical Decision Making/ A&P                                 Medical Decision Making Risk Prescription drug management.   Patient is alert, afebrile, and hemodynamically stable in the ED in no acute distress.  Physical exam as noted above, conjunctival injection noted with chemosis to the right side.  Visual acuity 20/40 (each eye and together).  Differential includes corneal abrasion, VZV, keratitis, acute angle-closure glaucoma, non-traumatic iritis, bacterial conjunctivitis.  Given pupillary reflex with light as well as normal IOP, low concern for glaucoma.  Woods lamp exam not consistent with corneal abrasion or herpes keratitis.  No limbic sparing to suggest iritis.  Overall, patient may have a bacterial conjunctivitis responsible for her symptoms today.  Will give erythromycin  ointment here and to take with her home.  Also discussed following up outpatient with ophthalmology in the next few days for reevaluation.  This information was placed in the AVS.  Strict return precautions were given, and patient was discharged in stable condition.   Patient seen in conjunction with Dr. Liam Redhead who agreed with the above work-up and plan of care.        Final Clinical Impression(s) / ED Diagnoses Final diagnoses:  Acute conjunctivitis of right eye, unspecified acute conjunctivitis type    Rx / DC Orders ED Discharge Orders     None         Lorain Robson, MD 02/14/24 2257    Dorenda Gandy, MD 02/25/24 [phone]

## 2024-02-18 ENCOUNTER — Ambulatory Visit: Payer: Medicaid Other

## 2024-02-18 DIAGNOSIS — I639 Cerebral infarction, unspecified: Secondary | ICD-10-CM

## 2024-02-18 LAB — CUP PACEART REMOTE DEVICE CHECK
Date Time Interrogation Session: 20250413231829
Implantable Pulse Generator Implant Date: 20240321

## 2024-03-03 NOTE — Progress Notes (Signed)
 [facility] Summary Report / Loop Recorder

## 2024-03-04 ENCOUNTER — Encounter: Payer: Self-pay | Admitting: Cardiovascular Disease

## 2024-03-10 ENCOUNTER — Ambulatory Visit

## 2024-03-17 ENCOUNTER — Emergency Department (HOSPITAL_COMMUNITY)
Admission: EM | Admit: 2024-03-17 | Discharge: 2024-03-18 | Disposition: A | Discharge status: Home / Self Care | Attending: Emergency Medicine | Admitting: Emergency Medicine

## 2024-03-17 ENCOUNTER — Encounter (HOSPITAL_COMMUNITY): Payer: Self-pay

## 2024-03-17 ENCOUNTER — Other Ambulatory Visit: Payer: Self-pay

## 2024-03-17 DIAGNOSIS — Z8673 Personal history of transient ischemic attack (TIA), and cerebral infarction without residual deficits: Secondary | ICD-10-CM | POA: Diagnosis not present

## 2024-03-17 DIAGNOSIS — H1589 Other disorders of sclera: Secondary | ICD-10-CM | POA: Diagnosis not present

## 2024-03-17 DIAGNOSIS — Z91040 Latex allergy status: Secondary | ICD-10-CM | POA: Diagnosis not present

## 2024-03-17 DIAGNOSIS — H5711 Ocular pain, right eye: Secondary | ICD-10-CM | POA: Diagnosis present

## 2024-03-17 MED ORDER — FLUORESCEIN SODIUM 1 MG OP STRP
1.0000 | ORAL_STRIP | Freq: Once | OPHTHALMIC | Status: AC
  Administered 2024-03-17: 1 via OPHTHALMIC
  Filled 2024-03-17: qty 1

## 2024-03-17 MED ORDER — TETRACAINE HCL 0.5 % OP SOLN
2.0000 [drp] | Freq: Once | OPHTHALMIC | Status: AC
  Administered 2024-03-17: 2 [drp] via OPHTHALMIC
  Filled 2024-03-17: qty 4

## 2024-03-17 NOTE — ED Provider Notes (Signed)
 [place] EMERGENCY DEPARTMENT AT [facility] Provider Note   CSN: [id number] Arrival date & time: 03/17/24  1520     History {Add pertinent medical, surgical, social history, OB history to HPI:1} Chief Complaint  Patient presents with  . Eye Pain    Theresa [name] is a 33 y.o. female with past medical history of anemia, depression, TIA presents emergency department for evaluation of right eye pain.  She presented to ED on 02/14/2024 for intermittent right eye pain and injected right eye and treated for conjunctivitis with erythromycin  ointment.  She has been taking erythromycin  ointment daily without improvement. Does not wear contacts.   Eye Pain      Home Medications Prior to Admission medications   Medication Sig Start Date End Date Taking? Authorizing Provider  aspirin  81 MG chewable tablet Chew 1 tablet (81 mg total) by mouth daily. 09/27/22   Hongalgi, Anand D, MD  clopidogrel  (PLAVIX ) 75 MG tablet Take 1 tablet (75 mg total) by mouth daily. 09/26/22   Hongalgi, Anand D, MD  meclizine  (ANTIVERT ) 25 MG tablet Take 1 tablet (25 mg total) by mouth 3 (three) times daily as needed for dizziness. 05/02/22   Samtani, Jai-Gurmukh, MD  metroNIDAZOLE  (FLAGYL ) 500 MG tablet Take 1 tablet (500 mg total) by mouth 2 (two) times daily. 06/20/23   Ervin, Michael L, MD  rosuvastatin  (CRESTOR ) 10 MG tablet Take 1 tablet (10 mg total) by mouth daily. 09/26/22   Hongalgi, Anand D, MD  sulfamethoxazole -trimethoprim  (BACTRIM  DS) 800-160 MG tablet Take 1 tablet by mouth 2 (two) times daily. 06/14/23   Tresia Fruit, MD      Allergies    Latex    Review of Systems   Review of Systems  Eyes:  Positive for pain.    Physical Exam Updated Vital Signs BP 105/70 (BP Location: Right Arm)   Pulse 67   Temp 98.4 F (36.9 C) (Oral)   Resp 16   Ht 5\' 1"  (1.549 m)   Wt 74.8 kg   SpO2 100%   BMI 31.18 kg/m  Physical Exam Vitals and nursing note reviewed.  Constitutional:       General: She is not in acute distress.    Appearance: Normal appearance.  HENT:     Head: Normocephalic and atraumatic.  Eyes:     General: Lids are normal. Lids are everted, no foreign bodies appreciated. Vision grossly intact.        Right eye: No discharge.     Intraocular pressure: Right eye pressure is 15 mmHg.     Extraocular Movements:     Right eye: Normal extraocular motion and no nystagmus.     Left eye: Normal extraocular motion and no nystagmus.     Conjunctiva/sclera:     Right eye: Right conjunctiva is injected. No chemosis, exudate or hemorrhage.    Pupils: Pupils are equal, round, and reactive to light.  Cardiovascular:     Rate and Rhythm: Normal rate.  Pulmonary:     Effort: Pulmonary effort is normal. No respiratory distress.  Skin:    Coloration: Skin is not jaundiced or pale.  Neurological:     Mental Status: She is alert. Mental status is at baseline.    ED Results / Procedures / Treatments   Labs (all labs ordered are listed, but only abnormal results are displayed) Labs Reviewed - No data to display  EKG None  Radiology No results found.  Procedures Procedures  {Document cardiac monitor, telemetry  assessment procedure when appropriate:1}  Medications Ordered in ED Medications - No data to display  ED Course/ Medical Decision Making/ A&P   {   Click here for ABCD2, HEART and other calculatorsREFRESH Note before signing :1}                              Medical Decision Making Risk Prescription drug management.   ***  {Document critical care time when appropriate:1} {Document review of labs and clinical decision tools ie heart score, Chads2Vasc2 etc:1}  {Document your independent review of radiology images, and any outside records:1} {Document your discussion with family members, caretakers, and with consultants:1} {Document social determinants of health affecting pt's care:1} {Document your decision making why or why not admission,  treatments were needed:1} Final Clinical Impression(s) / ED Diagnoses Final diagnoses:  None    Rx / DC Orders ED Discharge Orders     None

## 2024-03-17 NOTE — ED Provider Notes (Incomplete)
 [facility] Provider Note   CSN: [id number] Arrival date & time: 03/17/24  1520     History {Add pertinent medical, surgical, social history, OB history to HPI:1} Chief Complaint  Patient presents with  . Eye Pain    Theresa [name] is a 33 y.o. female with past medical history of anemia, depression, TIA presents emergency department for evaluation of right eye pain.  She presented to ED on 02/14/2024 for intermittent right eye pain and injected right eye and treated for conjunctivitis with erythromycin  ointment.  She has been taking erythromycin  ointment daily without improvement. Does not wear contacts.   Eye Pain       Home Medications Prior to Admission medications   Medication Sig Start Date End Date Taking? Authorizing Provider  aspirin  81 MG chewable tablet Chew 1 tablet (81 mg total) by mouth daily. 09/27/22   Hongalgi, Anand D, MD  clopidogrel  (PLAVIX ) 75 MG tablet Take 1 tablet (75 mg total) by mouth daily. 09/26/22   Hongalgi, Anand D, MD  meclizine  (ANTIVERT ) 25 MG tablet Take 1 tablet (25 mg total) by mouth 3 (three) times daily as needed for dizziness. 05/02/22   Samtani, Jai-Gurmukh, MD  metroNIDAZOLE  (FLAGYL ) 500 MG tablet Take 1 tablet (500 mg total) by mouth 2 (two) times daily. 06/20/23   Ervin, Michael L, MD  rosuvastatin  (CRESTOR ) 10 MG tablet Take 1 tablet (10 mg total) by mouth daily. 09/26/22   Hongalgi, Anand D, MD  sulfamethoxazole -trimethoprim  (BACTRIM  DS) 800-160 MG tablet Take 1 tablet by mouth 2 (two) times daily. 06/14/23   Tresia Fruit, MD      Allergies    Latex    Review of Systems   Review of Systems  Eyes:  Positive for pain.    Physical Exam Updated Vital Signs BP 105/70 (BP Location: Right Arm)   Pulse 67   Temp 98.4 F (36.9 C) (Oral)   Resp 16   Ht 5\' 1"  (1.549 m)   Wt 74.8 kg   SpO2 100%   BMI 31.18 kg/m  Physical Exam Vitals and nursing note reviewed.  Constitutional:       General: She is not in acute distress.    Appearance: Normal appearance.  HENT:     Head: Normocephalic and atraumatic.  Eyes:     General: Lids are normal. Lids are everted, no foreign bodies appreciated. Vision grossly intact.        Right eye: No discharge.     Intraocular pressure: Right eye pressure is 15 mmHg.     Extraocular Movements:     Right eye: Normal extraocular motion and no nystagmus.     Left eye: Normal extraocular motion and no nystagmus.     Conjunctiva/sclera:     Right eye: Right conjunctiva is injected. No chemosis, exudate or hemorrhage.    Pupils: Pupils are equal, round, and reactive to light.     Comments: Injection around limbus of right eye Pain with and wo EOM VA 20/40 R and 20/32 L  Cardiovascular:     Rate and Rhythm: Normal rate.  Pulmonary:     Effort: Pulmonary effort is normal. No respiratory distress.  Skin:    Coloration: Skin is not jaundiced or pale.  Neurological:     Mental Status: She is alert. Mental status is at baseline.     ED Results / Procedures / Treatments   Labs (all labs ordered are listed, but only abnormal results are  displayed) Labs Reviewed - No data to display  EKG None  Radiology No results found.  Procedures Procedures  {Document cardiac monitor, telemetry assessment procedure when appropriate:1}  Medications Ordered in ED Medications - No data to display  ED Course/ Medical Decision Making/ A&P   {   Click here for ABCD2, HEART and other calculatorsREFRESH Note before signing :1}                              Medical Decision Making Risk Prescription drug management.     Patient presents to the ED for concern of ***, this involves an extensive number of treatment options, and is a complaint that carries with it a high risk of complications and morbidity.  The differential diagnosis includes ***   Co morbidities that complicate the patient evaluation  ***   Additional history  obtained:  Additional history obtained from *** {Blank multiple:19196::"EMS","Family","Nursing","Outside Medical Records","Past Admission"}   External records from outside source obtained and reviewed including ***   Lab Tests:  I Ordered, and personally interpreted labs.  The pertinent results include:  ***   Imaging Studies ordered:  I ordered imaging studies including ***  I independently visualized and interpreted imaging which showed *** I agree with the radiologist interpretation   Cardiac Monitoring:  The patient was maintained on a cardiac monitor.  I personally viewed and interpreted the cardiac monitored which showed an underlying rhythm of: ***   Medicines ordered and prescription drug management:  I ordered medication including ***  for ***  Reevaluation of the patient after these medicines showed that the patient {resolved/improved/worsened:23923::"improved"} I have reviewed the patients home medicines and have made adjustments as needed    Consultations Obtained:  I requested consultation with ophthalmology Dr. Ambrosio Junker ,  and discussed lab and imaging findings as well as pertinent plan - they recommend: ***   Problem List / ED Course:  ***   Reevaluation:  After the interventions noted above, I reevaluated the patient and found that they have :{resolved/improved/worsened:23923::"improved"}   Social Determinants of Health:  ***   Dispostion:  After consideration of the diagnostic results and the patients response to treatment, I feel that the patent would benefit from ***.    {Document critical care time when appropriate:1} {Document review of labs and clinical decision tools ie heart score, Chads2Vasc2 etc:1}  {Document your independent review of radiology images, and any outside records:1} {Document your discussion with family members, caretakers, and with consultants:1} {Document social determinants of health affecting pt's care:1} {Document  your decision making why or why not admission, treatments were needed:1} Final Clinical Impression(s) / ED Diagnoses Final diagnoses:  None    Rx / DC Orders ED Discharge Orders     None

## 2024-03-17 NOTE — ED Triage Notes (Signed)
 Pt came in via POV d/t her Rt eye being very red & she states it looks like there is a "grey ring" around it. Endorses no visual changes but its very painful to keep it open. She was Tx for pink eye last month with eye ointment ABT but it has gotten worse. A/Ox4, rates her pain 10/10.

## 2024-03-18 NOTE — ED Notes (Signed)
 Pt refused VS

## 2024-03-18 NOTE — ED Provider Notes (Signed)
  3:24 AM Spoke with Dr. Ambrosio Junker-- was apologetic for delay, pager malfunction so just got all messages from hours prior.  No further changes in treatment recommended tonight, he will see patient in the morning at 8:15am at his office.  He was given contact information for her as well.    Patient given office information in her AVS.  Encouraged to follow-up as instructed.  Can return here for new concerns.   Coretha Dew, PA-C 03/18/24 1610    Earma Gloss, MD 03/18/24 [phone]

## 2024-03-18 NOTE — Discharge Instructions (Addendum)
 Go to Dr. Linnell Richardson office at 8:15am for formal eye exam.  He is expecting you.

## 2024-03-19 NOTE — Progress Notes (Deleted)
  Electrophysiology Office Note:   Date:  03/19/2024  ID:  RANADA GUEBARA, DOB 17-Dec-1990, MRN [id number]  Primary Cardiologist: None Primary Heart Failure: None Electrophysiologist: Efraim Grange, MD  {Click to update primary MD,subspecialty MD or APP then REFRESH:1}    History of Present Illness:   Theresa [name] is a 33 y.o. 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   Since last being seen in our clinic the patient reports doing ***.  she denies chest pain, palpitations, dyspnea, PND, orthopnea, nausea, vomiting, dizziness, syncope, edema, weight gain, or early satiety.   Review of systems complete and found to be negative unless listed in HPI.   EP Information / Studies Reviewed:    EKG is ordered today. Personal review as below.      Studies:  ECHO 09/2022 > LVEF 60-65% Cardiac Monitor 11/2022 > no arrhythmia  Device MDT ILR Linq II > implanted 01/25/23 for cryptogenic stroke    Risk Assessment/Calculations:              Physical Exam:   VS:  There were no vitals taken for this visit.   Wt Readings from Last 3 Encounters:  03/17/24 165 lb (74.8 kg)  10/29/23 165 lb (74.8 kg)  06/14/23 163 lb 8 oz (74.2 kg)     GEN: Well nourished, well developed in no acute distress NECK: No JVD; No carotid bruits CARDIAC: {EPRHYTHM:28826}, no murmurs, rubs, gallops RESPIRATORY:  Clear to auscultation without rales, wheezing or rhonchi  ABDOMEN: Soft, non-tender, non-distended EXTREMITIES:  No edema; No deformity   ASSESSMENT AND PLAN:    Cryptogenic Stroke  TIA  -ILR review shows *** -no AF symptoms  Follow up with Dr. Arlester Ladd or EP APP {EPFOLLOW QV:95638}  Signed, Creighton Doffing, NP-C, AGACNP-BC [facility] - Electrophysiology  03/19/2024, 8:20 PM

## 2024-03-20 ENCOUNTER — Ambulatory Visit: Admitting: Pulmonary Disease

## 2024-03-20 ENCOUNTER — Encounter: Admitting: Pulmonary Disease

## 2024-03-20 DIAGNOSIS — I639 Cerebral infarction, unspecified: Secondary | ICD-10-CM

## 2024-03-21 ENCOUNTER — Encounter: Payer: Self-pay | Admitting: Pulmonary Disease

## 2024-03-24 ENCOUNTER — Ambulatory Visit: Payer: Medicaid Other

## 2024-03-24 ENCOUNTER — Other Ambulatory Visit (HOSPITAL_COMMUNITY)
Admission: RE | Admit: 2024-03-24 | Discharge: 2024-03-24 | Disposition: A | Discharge status: Home / Self Care | Source: Ambulatory Visit | Attending: Family Medicine | Admitting: Family Medicine

## 2024-03-24 ENCOUNTER — Other Ambulatory Visit: Payer: Self-pay

## 2024-03-24 ENCOUNTER — Ambulatory Visit

## 2024-03-24 VITALS — BP 109/65 | HR 93 | Ht 61.0 in | Wt 164.6 lb

## 2024-03-24 DIAGNOSIS — Z3202 Encounter for pregnancy test, result negative: Secondary | ICD-10-CM | POA: Diagnosis not present

## 2024-03-24 DIAGNOSIS — I639 Cerebral infarction, unspecified: Secondary | ICD-10-CM | POA: Diagnosis not present

## 2024-03-24 DIAGNOSIS — N898 Other specified noninflammatory disorders of vagina: Secondary | ICD-10-CM

## 2024-03-24 DIAGNOSIS — R8290 Unspecified abnormal findings in urine: Secondary | ICD-10-CM

## 2024-03-24 LAB — CUP PACEART REMOTE DEVICE CHECK
Date Time Interrogation Session: 20250518232744
Implantable Pulse Generator Implant Date: 20240321

## 2024-03-24 LAB — POCT PREGNANCY, URINE: Preg Test, Ur: NEGATIVE

## 2024-03-24 NOTE — Progress Notes (Signed)
 Pt here today with complaints of increased vaginal discharge and discomfort.  Pt denies any odor.  Vaginal self swab completed by pt for wet prep.  Urinalysis negative other than small leukocytes, urine culture ordered.  Pt informed she will be called with any abnormal results.    Carolynne Citron, RN

## 2024-03-25 ENCOUNTER — Ambulatory Visit: Payer: Self-pay | Admitting: Family Medicine

## 2024-03-25 LAB — CERVICOVAGINAL ANCILLARY ONLY
Bacterial Vaginitis (gardnerella): POSITIVE — AB
Candida Glabrata: NEGATIVE
Candida Vaginitis: NEGATIVE
Chlamydia: NEGATIVE
Comment: NEGATIVE
Comment: NEGATIVE
Comment: NEGATIVE
Comment: NEGATIVE
Comment: NEGATIVE
Comment: NORMAL
Neisseria Gonorrhea: NEGATIVE
Trichomonas: POSITIVE — AB

## 2024-03-25 LAB — POCT URINALYSIS DIPSTICK, ED / UC
Bilirubin Urine: NEGATIVE
Glucose, UA: NEGATIVE mg/dL
Hgb urine dipstick: NEGATIVE
Ketones, ur: NEGATIVE mg/dL
Nitrite: NEGATIVE
Protein, ur: NEGATIVE mg/dL
Specific Gravity, Urine: 1.025 (ref 1.005–1.030)
Urobilinogen, UA: 1 mg/dL (ref 0.0–1.0)
pH: 6.5 (ref 5.0–8.0)

## 2024-03-25 MED ORDER — METRONIDAZOLE 500 MG PO TABS: 500.0000 mg | ORAL_TABLET | Freq: Two times a day (BID) | ORAL | 0 refills | Status: AC

## 2024-03-31 ENCOUNTER — Ambulatory Visit: Payer: Self-pay | Admitting: Cardiovascular Disease

## 2024-04-10 NOTE — Progress Notes (Signed)
 [facility] Summary Report / Loop Recorder

## 2024-04-24 ENCOUNTER — Ambulatory Visit: Payer: Self-pay | Admitting: Cardiovascular Disease

## 2024-04-24 ENCOUNTER — Ambulatory Visit (INDEPENDENT_AMBULATORY_CARE_PROVIDER_SITE_OTHER)

## 2024-04-24 DIAGNOSIS — I639 Cerebral infarction, unspecified: Secondary | ICD-10-CM | POA: Diagnosis not present

## 2024-04-24 LAB — CUP PACEART REMOTE DEVICE CHECK
Date Time Interrogation Session: 20250618232818
Implantable Pulse Generator Implant Date: 20240321

## 2024-04-29 ENCOUNTER — Ambulatory Visit: Attending: Cardiovascular Disease | Admitting: Cardiovascular Disease

## 2024-04-29 ENCOUNTER — Ambulatory Visit: Admitting: Nurse Practitioner

## 2024-05-12 ENCOUNTER — Ambulatory Visit

## 2024-05-15 NOTE — Progress Notes (Signed)
 [facility] Summary Report / Loop Recorder

## 2024-05-21 ENCOUNTER — Ambulatory Visit

## 2024-05-26 ENCOUNTER — Ambulatory Visit

## 2024-05-26 DIAGNOSIS — I639 Cerebral infarction, unspecified: Secondary | ICD-10-CM | POA: Diagnosis not present

## 2024-05-27 LAB — CUP PACEART REMOTE DEVICE CHECK
Date Time Interrogation Session: 20250720233532
Implantable Pulse Generator Implant Date: 20240321

## 2024-05-29 ENCOUNTER — Ambulatory Visit

## 2024-06-01 ENCOUNTER — Ambulatory Visit: Payer: Self-pay | Admitting: Cardiovascular Disease

## 2024-06-10 ENCOUNTER — Ambulatory Visit: Admitting: Obstetrics and Gynecology

## 2024-06-24 NOTE — Progress Notes (Signed)
 [facility] Summary Report / Loop Recorder

## 2024-06-26 ENCOUNTER — Ambulatory Visit (INDEPENDENT_AMBULATORY_CARE_PROVIDER_SITE_OTHER)

## 2024-06-26 DIAGNOSIS — I639 Cerebral infarction, unspecified: Secondary | ICD-10-CM

## 2024-06-26 LAB — CUP PACEART REMOTE DEVICE CHECK
Date Time Interrogation Session: 20250820234152
Implantable Pulse Generator Implant Date: 20240321

## 2024-06-30 ENCOUNTER — Ambulatory Visit: Payer: Self-pay | Admitting: Cardiovascular Disease

## 2024-07-02 ENCOUNTER — Encounter: Payer: Self-pay | Admitting: Family Medicine

## 2024-07-02 ENCOUNTER — Ambulatory Visit: Attending: Cardiology | Admitting: Cardiovascular Disease

## 2024-07-02 ENCOUNTER — Ambulatory Visit: Admitting: Family Medicine

## 2024-07-02 NOTE — Progress Notes (Deleted)
 Electrophysiology Office Note:    Date:  07/02/2024   ID:  FELISE GEORGIA, DOB Jun 05, 1991, MRN [id number]  PCP:  Freddrick Johns   [facility] Providers Cardiologist:  None Electrophysiologist:  Eulas FORBES Furbish, MD     Referring MD: No ref. provider found   History of Present Illness:    Theresa [name] is a 33 y.o. female with a hx listed below, significant for recurrent stroke symptoms, referred for arrhythmia management.  She was a history of prior cryptogenic stroke and was admitted in November, 2023 with recurrent stroke symptoms. She was not able to get an MRI due to a permanently attached hair weave.   She has had a thorough evaluation for stroke in June 2023. This included TEE and vascular ultrasound. She wore a monitor in January that did not show any atrial fibrillation.   Past Medical History:  Diagnosis Date   Anemia    Chlamydia    Depression    Eczema    Gonorrhea    Pyelonephritis    X 2   TIA (transient ischemic attack)     Past Surgical History:  Procedure Laterality Date   BUBBLE STUDY  05/02/2022   Procedure: BUBBLE STUDY;  Surgeon: Mona Vinie BROCKS, MD;  Location: [facility] ENDOSCOPY;  Service: Cardiovascular;;   CESAREAN SECTION N/A 05/20/2017   Procedure: CESAREAN SECTION;  Surgeon: Jayne Vonn DEL, MD;  Location: [facility] BIRTHING SUITES;  Service: Obstetrics;  Laterality: N/A;   NO PAST SURGERIES     TEE WITHOUT CARDIOVERSION N/A 05/02/2022   Procedure: TRANSESOPHAGEAL ECHOCARDIOGRAM (TEE);  Surgeon: Mona Vinie BROCKS, MD;  Location: [facility] ENDOSCOPY;  Service: Cardiovascular;  Laterality: N/A;   TUBAL LIGATION Bilateral 05/20/2017   Procedure: BILATERAL TUBAL LIGATION;  Surgeon: Jayne Vonn DEL, MD;  Location: [facility] BIRTHING SUITES;  Service: Obstetrics;  Laterality: Bilateral;    Current Medications: No outpatient medications have been marked as taking for the 07/02/24 encounter (Appointment) with Arletta Lumadue, Eulas FORBES, MD.     Allergies:   Latex   Social and Family  History: Reviewed in Epic  ROS:   Please see the history of present illness.    All other systems reviewed and are negative.  EKGs/Labs/Other Studies Reviewed Today:    Echocardiogram:  TTE 09/26/2022  1. Left ventricular ejection fraction, by estimation, is 60 to 65%. Left ventricular ejection fraction by 3D volume is 63 %. The left ventricle has normal function. The left ventricle has no regional wall motion abnormalities. Left ventricular diastolic  parameters were normal.   2. Right ventricular systolic function is normal. The right ventricular size is normal.   3. The mitral valve is normal in structure. No evidence of mitral valve  regurgitation. No evidence of mitral stenosis.   4. The aortic valve is tricuspid. Aortic valve regurgitation is not visualized. No aortic stenosis is present.   Monitors:  11/2022 Monitor worn 8d 10h Sinus rhythm 57-151 bpm, avg 84 bpm <1% ectopy, no significant arrhythmia Symptoms of flutter, skipped beats, shortness of breath, light-headedness correlated with sinus rhythm  Advanced imaging:   EKG:  Last EKG results: today sinus arrhythmia, 83 bpm; RsR' in V1   Recent Labs: 02/14/2024: ALT 15; BUN 7; Creatinine, Ser 0.85; Hemoglobin 14.1; Platelets 259; Potassium 3.8; Sodium 139     Physical Exam:    VS:  There were no vitals taken for this visit.    Wt Readings from Last 3 Encounters:  03/24/24 164 lb 9.6 oz (74.7 kg)  03/17/24 165 lb (74.8 kg)  10/29/23 165 lb (74.8 kg)     GEN: Well nourished, well developed in no acute distress CARDIAC: RRR, no murmurs, rubs, gallops RESPIRATORY:  Normal work of breathing MUSCULOSKELETAL: no edema    ASSESSMENT & PLAN:    Recurrent cryptogenic strokes - I recommended ILR. I explained the rationale and logistics of the procedure. I explained monitoring process including the monthly fee.  She would like to proceed.             Medication Adjustments/Labs and Tests  Ordered: Current medicines are reviewed at length with the patient today.  Concerns regarding medicines are outlined above.  No orders of the defined types were placed in this encounter.  No orders of the defined types were placed in this encounter.    Signed, Eulas FORBES Furbish, MD  07/02/2024 4:04 PM    [facility]

## 2024-07-02 NOTE — Progress Notes (Signed)
 Patient did not keep appointment today. She may call to reschedule.

## 2024-07-28 ENCOUNTER — Ambulatory Visit (INDEPENDENT_AMBULATORY_CARE_PROVIDER_SITE_OTHER)

## 2024-07-28 DIAGNOSIS — I639 Cerebral infarction, unspecified: Secondary | ICD-10-CM

## 2024-07-28 LAB — CUP PACEART REMOTE DEVICE CHECK
Date Time Interrogation Session: 20250921232624
Implantable Pulse Generator Implant Date: 20240321

## 2024-07-29 NOTE — Progress Notes (Signed)
 Remote Loop Recorder Transmission

## 2024-08-11 NOTE — Progress Notes (Signed)
 Remote Loop Recorder Transmission

## 2024-08-12 ENCOUNTER — Ambulatory Visit (INDEPENDENT_AMBULATORY_CARE_PROVIDER_SITE_OTHER)

## 2024-08-12 ENCOUNTER — Other Ambulatory Visit: Payer: Self-pay

## 2024-08-12 ENCOUNTER — Other Ambulatory Visit (HOSPITAL_COMMUNITY)
Admission: RE | Admit: 2024-08-12 | Discharge: 2024-08-12 | Disposition: A | Discharge status: Home / Self Care | Source: Ambulatory Visit | Attending: Family Medicine | Admitting: Family Medicine

## 2024-08-12 ENCOUNTER — Ambulatory Visit: Payer: Self-pay | Admitting: Cardiovascular Disease

## 2024-08-12 VITALS — BP 110/62 | HR 83 | Ht 61.0 in | Wt 165.0 lb

## 2024-08-12 DIAGNOSIS — N898 Other specified noninflammatory disorders of vagina: Secondary | ICD-10-CM

## 2024-08-12 DIAGNOSIS — R8290 Unspecified abnormal findings in urine: Secondary | ICD-10-CM

## 2024-08-12 LAB — POCT URINALYSIS DIP (DEVICE)
Bilirubin Urine: NEGATIVE
Glucose, UA: NEGATIVE mg/dL
Ketones, ur: NEGATIVE mg/dL
Nitrite: POSITIVE — AB
Protein, ur: 30 mg/dL — AB
Specific Gravity, Urine: >=1.03 (ref 1.005–1.030)
Urobilinogen, UA: 0.2 mg/dL (ref 0.0–1.0)
pH: 5.5 (ref 5.0–8.0)

## 2024-08-12 NOTE — Progress Notes (Signed)
 Pt here today with c/o thin discharge that she wants to make sure is not BV and/or trich. Pt also verbalized that she has some irritation with urination.  Pt reports that is on her period today. UA results are abnormal.  I advised pt that I would send urine for cx and we will call with abnormal results.  Pt explained how to obtain the self swab and that we will cal with abnormal results.    Caris Cerveny,RN  08/12/24

## 2024-08-13 ENCOUNTER — Ambulatory Visit: Payer: Self-pay | Admitting: Family Medicine

## 2024-08-13 DIAGNOSIS — N3000 Acute cystitis without hematuria: Secondary | ICD-10-CM

## 2024-08-13 LAB — CERVICOVAGINAL ANCILLARY ONLY
Bacterial Vaginitis (gardnerella): POSITIVE — AB
Candida Glabrata: NEGATIVE
Candida Vaginitis: NEGATIVE
Chlamydia: NEGATIVE
Comment: NEGATIVE
Comment: NEGATIVE
Comment: NEGATIVE
Comment: NEGATIVE
Comment: NEGATIVE
Comment: NORMAL
Neisseria Gonorrhea: NEGATIVE
Trichomonas: NEGATIVE

## 2024-08-13 MED ORDER — METRONIDAZOLE 0.75 % VA GEL
1.0000 | Freq: Every day | VAGINAL | 0 refills | Status: AC
Start: 2024-08-13 — End: 2024-08-18

## 2024-08-15 LAB — URINE CULTURE

## 2024-08-15 MED ORDER — AMOXICILLIN-POT CLAVULANATE 875-125 MG PO TABS: 1.0000 | ORAL_TABLET | Freq: Two times a day (BID) | ORAL | 0 refills | Status: AC

## 2024-08-28 ENCOUNTER — Encounter

## 2024-08-29 ENCOUNTER — Ambulatory Visit: Attending: Cardiovascular Disease

## 2024-08-29 DIAGNOSIS — I4891 Unspecified atrial fibrillation: Secondary | ICD-10-CM

## 2024-08-29 LAB — CUP PACEART REMOTE DEVICE CHECK
Date Time Interrogation Session: 20251023233129
Implantable Pulse Generator Implant Date: 20240321

## 2024-09-02 NOTE — Progress Notes (Signed)
 Remote Loop Recorder Transmission

## 2024-09-10 ENCOUNTER — Ambulatory Visit: Payer: Self-pay | Admitting: Cardiovascular Disease

## 2024-09-17 NOTE — Progress Notes (Deleted)
 Electrophysiology Office Note:    Date:  09/17/2024   ID:  Theresa [name], DOB 1990-12-17, MRN [id number]  PCP:  Freddrick No   [facility] Providers Cardiologist:  None Electrophysiologist:  Eulas FORBES Furbish, MD     Referring MD: No ref. provider found   History of Present Illness:    Theresa [name] is a 33 y.o. female with a hx listed below, significant for recurrent stroke symptoms, referred for arrhythmia management.  She was a history of prior cryptogenic stroke and was admitted in November, 2023 with recurrent stroke symptoms. She was not able to get an MRI due to a permanently attached hair weave.   She has had a thorough evaluation for stroke in June 2023. This included TEE and vascular ultrasound. She wore a monitor in January that did not show any atrial fibrillation.   Past Medical History:  Diagnosis Date   Anemia    Chlamydia    Depression    Eczema    Gonorrhea    Pyelonephritis    X 2   TIA (transient ischemic attack)     Past Surgical History:  Procedure Laterality Date   BUBBLE STUDY  05/02/2022   Procedure: BUBBLE STUDY;  Surgeon: Mona Vinie BROCKS, MD;  Location: [facility] ENDOSCOPY;  Service: Cardiovascular;;   CESAREAN SECTION N/A 05/20/2017   Procedure: CESAREAN SECTION;  Surgeon: Jayne Vonn DEL, MD;  Location: [facility] BIRTHING SUITES;  Service: Obstetrics;  Laterality: N/A;   NO PAST SURGERIES     TEE WITHOUT CARDIOVERSION N/A 05/02/2022   Procedure: TRANSESOPHAGEAL ECHOCARDIOGRAM (TEE);  Surgeon: Mona Vinie BROCKS, MD;  Location: [facility] ENDOSCOPY;  Service: Cardiovascular;  Laterality: N/A;   TUBAL LIGATION Bilateral 05/20/2017   Procedure: BILATERAL TUBAL LIGATION;  Surgeon: Jayne Vonn DEL, MD;  Location: [facility] BIRTHING SUITES;  Service: Obstetrics;  Laterality: Bilateral;    Current Medications: No outpatient medications have been marked as taking for the 09/18/24 encounter (Appointment) with Blasa Raisch, Eulas FORBES, MD.     Allergies:   Latex   Social and  Family History: Reviewed in Epic  ROS:   Please see the history of present illness.    All other systems reviewed and are negative.  EKGs/Labs/Other Studies Reviewed Today:    Echocardiogram:  TTE 09/26/2022  1. Left ventricular ejection fraction, by estimation, is 60 to 65%. Left ventricular ejection fraction by 3D volume is 63 %. The left ventricle has normal function. The left ventricle has no regional wall motion abnormalities. Left ventricular diastolic  parameters were normal.   2. Right ventricular systolic function is normal. The right ventricular size is normal.   3. The mitral valve is normal in structure. No evidence of mitral valve  regurgitation. No evidence of mitral stenosis.   4. The aortic valve is tricuspid. Aortic valve regurgitation is not visualized. No aortic stenosis is present.   Monitors:  11/2022 Monitor worn 8d 10h Sinus rhythm 57-151 bpm, avg 84 bpm <1% ectopy, no significant arrhythmia Symptoms of flutter, skipped beats, shortness of breath, light-headedness correlated with sinus rhythm  Advanced imaging:   EKG:  Last EKG results: today sinus arrhythmia, 83 bpm; RsR' in V1   Recent Labs: 02/14/2024: ALT 15; BUN 7; Creatinine, Ser 0.85; Hemoglobin 14.1; Platelets 259; Potassium 3.8; Sodium 139     Physical Exam:    VS:  There were no vitals taken for this visit.    Wt Readings from Last 3 Encounters:  08/12/24 165 lb (74.8 kg)  03/24/24 164  lb 9.6 oz (74.7 kg)  03/17/24 165 lb (74.8 kg)     GEN: Well nourished, well developed in no acute distress CARDIAC: RRR, no murmurs, rubs, gallops RESPIRATORY:  Normal work of breathing MUSCULOSKELETAL: no edema    ASSESSMENT & PLAN:    Recurrent cryptogenic strokes - I recommended ILR. I explained the rationale and logistics of the procedure. I explained monitoring process including the monthly fee.  She would like to proceed.     DESCRIPTION OF PROCEDURE:  Informed written consent was  obtained.  A timeout was performed. The patient required no sedation for the procedure today.  The patients left chest was prepped and draped in the usual sterile fashion. The skin overlying the left parasternal region was infiltrated with lidocaine  for local analgesia.  A 0.5-cm incision was made over the left parasternal region over the 3rd intercostal space.  A medtronic Link II implantable loop recorder was then inserted using the supplied delivery system.  R waves were very prominent and measured >0.60mV.  Steri- Strips and a sterile dressing were then applied.  There were no early apparent complications.     CONCLUSIONS:   1. Successful implantation of a Metronic linq 2 implantable loop recorder (SN  [id number]) for a history of cryptogenic stroke.   2. No early apparent complications.            Medication Adjustments/Labs and Tests Ordered: Current medicines are reviewed at length with the patient today.  Concerns regarding medicines are outlined above.  No orders of the defined types were placed in this encounter.  No orders of the defined types were placed in this encounter.    Signed, Eulas FORBES Furbish, MD  09/17/2024 4:51 PM    [facility]

## 2024-09-18 ENCOUNTER — Ambulatory Visit: Attending: Cardiovascular Disease | Admitting: Cardiovascular Disease

## 2024-09-19 ENCOUNTER — Encounter: Payer: Self-pay | Admitting: Cardiovascular Disease

## 2024-09-22 ENCOUNTER — Ambulatory Visit: Admitting: Obstetrics and Gynecology

## 2024-09-29 ENCOUNTER — Ambulatory Visit

## 2024-09-29 ENCOUNTER — Encounter

## 2024-09-29 DIAGNOSIS — I4891 Unspecified atrial fibrillation: Secondary | ICD-10-CM | POA: Diagnosis not present

## 2024-09-30 LAB — CUP PACEART REMOTE DEVICE CHECK
Date Time Interrogation Session: 20251123234821
Implantable Pulse Generator Implant Date: 20240321

## 2024-09-30 NOTE — Progress Notes (Signed)
 Remote Loop Recorder Transmission

## 2024-10-07 ENCOUNTER — Ambulatory Visit: Payer: Self-pay | Admitting: Cardiovascular Disease

## 2024-10-30 ENCOUNTER — Ambulatory Visit

## 2024-10-30 ENCOUNTER — Encounter

## 2024-10-30 DIAGNOSIS — I4891 Unspecified atrial fibrillation: Secondary | ICD-10-CM

## 2024-10-31 LAB — CUP PACEART REMOTE DEVICE CHECK
Date Time Interrogation Session: 20251224233119
Implantable Pulse Generator Implant Date: 20240321

## 2024-10-31 NOTE — Progress Notes (Signed)
 Remote Loop Recorder Transmission

## 2024-11-05 ENCOUNTER — Ambulatory Visit: Payer: Self-pay | Admitting: Cardiovascular Disease

## 2024-11-14 ENCOUNTER — Emergency Department (HOSPITAL_COMMUNITY): Admission: EM | Admit: 2024-11-14 | Discharge: 2024-11-14 | Discharge status: Against Medical Advice

## 2024-11-14 ENCOUNTER — Emergency Department (HOSPITAL_COMMUNITY)

## 2024-11-14 ENCOUNTER — Other Ambulatory Visit: Payer: Self-pay

## 2024-11-14 ENCOUNTER — Encounter (HOSPITAL_COMMUNITY): Payer: Self-pay | Admitting: *Deleted

## 2024-11-14 DIAGNOSIS — R220 Localized swelling, mass and lump, head: Secondary | ICD-10-CM | POA: Diagnosis not present

## 2024-11-14 DIAGNOSIS — Z5321 Procedure and treatment not carried out due to patient leaving prior to being seen by health care provider: Secondary | ICD-10-CM | POA: Diagnosis not present

## 2024-11-14 DIAGNOSIS — M79661 Pain in right lower leg: Secondary | ICD-10-CM | POA: Diagnosis not present

## 2024-11-14 DIAGNOSIS — R519 Headache, unspecified: Secondary | ICD-10-CM | POA: Diagnosis not present

## 2024-11-14 DIAGNOSIS — M79671 Pain in right foot: Secondary | ICD-10-CM | POA: Diagnosis present

## 2024-11-14 LAB — COMPREHENSIVE METABOLIC PANEL WITH GFR
ALT: 13 U/L (ref 0–44)
AST: 23 U/L (ref 15–41)
Albumin: 4.1 g/dL (ref 3.5–5.0)
Alkaline Phosphatase: 95 U/L (ref 38–126)
Anion gap: 10 (ref 5–15)
BUN: 9 mg/dL (ref 6–20)
CO2: 22 mmol/L (ref 22–32)
Calcium: 9 mg/dL (ref 8.9–10.3)
Chloride: 104 mmol/L (ref 98–111)
Creatinine, Ser: 0.79 mg/dL (ref 0.44–1.00)
GFR, Estimated: >60 mL/min
Glucose, Bld: 96 mg/dL (ref 70–99)
Potassium: 4.1 mmol/L (ref 3.5–5.1)
Sodium: 137 mmol/L (ref 135–145)
Total Bilirubin: 0.2 mg/dL (ref 0.0–1.2)
Total Protein: 7.5 g/dL (ref 6.5–8.1)

## 2024-11-14 LAB — CBC
HCT: 40.5 % (ref 36.0–46.0)
Hemoglobin: 13.6 g/dL (ref 12.0–15.0)
MCH: 29.9 pg (ref 26.0–34.0)
MCHC: 33.6 g/dL (ref 30.0–36.0)
MCV: 89 fL (ref 80.0–100.0)
Platelets: 234 K/uL (ref 150–400)
RBC: 4.55 MIL/uL (ref 3.87–5.11)
RDW: 14 % (ref 11.5–15.5)
WBC: 10.7 K/uL — ABNORMAL HIGH (ref 4.0–10.5)
nRBC: 0 % (ref 0.0–0.2)

## 2024-11-14 LAB — URINE DRUG SCREEN
Amphetamines: NEGATIVE
Barbiturates: NEGATIVE
Benzodiazepines: NEGATIVE
Cocaine: NEGATIVE
Fentanyl: NEGATIVE
Methadone Scn, Ur: NEGATIVE
Opiates: NEGATIVE
Tetrahydrocannabinol: POSITIVE — AB

## 2024-11-14 LAB — PROTIME-INR
INR: 1 (ref 0.8–1.2)
Prothrombin Time: 13.5 s (ref 11.4–15.2)

## 2024-11-14 LAB — DIFFERENTIAL
Abs Immature Granulocytes: 0.03 K/uL (ref 0.00–0.07)
Basophils Absolute: 0.1 K/uL (ref 0.0–0.1)
Basophils Relative: 1 %
Eosinophils Absolute: 0.2 K/uL (ref 0.0–0.5)
Eosinophils Relative: 2 %
Immature Granulocytes: 0 %
Lymphocytes Relative: 42 %
Lymphs Abs: 4.5 K/uL — ABNORMAL HIGH (ref 0.7–4.0)
Monocytes Absolute: 0.6 K/uL (ref 0.1–1.0)
Monocytes Relative: 6 %
Neutro Abs: 5.3 K/uL (ref 1.7–7.7)
Neutrophils Relative %: 49 %

## 2024-11-14 LAB — ETHANOL: Alcohol, Ethyl (B): <15 mg/dL

## 2024-11-14 LAB — HCG, SERUM, QUALITATIVE: Preg, Serum: NEGATIVE

## 2024-11-14 LAB — APTT: aPTT: 27 s (ref 24–36)

## 2024-11-14 NOTE — ED Provider Triage Note (Signed)
 Emergency Medicine Provider Triage Evaluation Note  Theresa [name] , a 34 y.o. female  was evaluated in triage.  Pt complains of right foot/leg pain and swelling, right side of face numbness x 2 weeks history of TIA, not anticoagulated.  Review of Systems  Positive: Right foot/leg pain and swelling, right side of face numbness, headache, starry vision Negative: Fever, chills, nausea, vomiting, injury  Physical Exam  BP 113/62 (BP Location: Right Arm)   Pulse 80   Temp 98.1 F (36.7 C)   Resp 16   Ht 5' 1 (1.549 m)   Wt 74.8 kg   SpO2 100%   BMI 31.16 kg/m  Gen:   Awake, no distress   Resp:  Normal effort  MSK:   Moves extremities without difficulty  Other:  Positive Homans' sign, decreased sensation in right sided V3 when compared to left  Medical Decision Making  Medically screening exam initiated at 4:28 PM.  Appropriate orders placed.  Theresa [name] was informed that the remainder of the evaluation will be completed by another provider, this initial triage assessment does not replace that evaluation, and the importance of remaining in the ED until their evaluation is complete.  Labs and imaging ordered   Theresa [name] 11/14/24 [id number]

## 2024-11-14 NOTE — ED Triage Notes (Signed)
 POV/ ambulatory/ c/o right foot/leg pain and swelling, right side face pain,numbness x 2 weeks/ A&OX4

## 2024-11-14 NOTE — ED Notes (Signed)
 Pt called x4 for treatment room, no response.

## 2024-11-14 NOTE — Progress Notes (Signed)
 Right lower extremity venous duplex has been completed.  Results can be found in chart review under CV Proc.  11/14/2024 8:02 PM  Janeese Mcgloin Elden Appl, RVT.

## 2024-11-14 NOTE — ED Triage Notes (Signed)
 The pt is a and o x4  she has had  rt leg and weakness for 2 weeks

## 2024-11-14 NOTE — ED Notes (Signed)
 The pts care was delayed because she was called hours ago but the pt   had gone  to the cafeteria  and returned ????? time

## 2024-11-30 ENCOUNTER — Ambulatory Visit: Attending: Cardiovascular Disease

## 2024-11-30 DIAGNOSIS — I639 Cerebral infarction, unspecified: Secondary | ICD-10-CM

## 2024-12-01 ENCOUNTER — Encounter

## 2024-12-01 LAB — CUP PACEART REMOTE DEVICE CHECK
Date Time Interrogation Session: 20260124234025
Implantable Pulse Generator Implant Date: 20240321

## 2024-12-02 ENCOUNTER — Ambulatory Visit: Payer: Self-pay | Admitting: Cardiovascular Disease

## 2024-12-04 NOTE — Progress Notes (Signed)
 Remote Loop Recorder Transmission

## 2024-12-31 ENCOUNTER — Ambulatory Visit

## 2025-01-01 ENCOUNTER — Encounter

## 2025-01-31 ENCOUNTER — Ambulatory Visit

## 2025-02-02 ENCOUNTER — Encounter

## 2025-02-13 ENCOUNTER — Ambulatory Visit: Admitting: Cardiovascular Disease

## 2025-03-03 ENCOUNTER — Ambulatory Visit
